# Patient Record
Sex: Female | Born: 1935
Health system: Southern US, Community
[De-identification: ages and names within clinical notes are randomized; demographics above are authoritative.]

## PROBLEM LIST (undated history)

## (undated) DIAGNOSIS — C801 Malignant (primary) neoplasm, unspecified: Secondary | ICD-10-CM

## (undated) DIAGNOSIS — C50919 Malignant neoplasm of unspecified site of unspecified female breast: Secondary | ICD-10-CM

## (undated) DIAGNOSIS — E119 Type 2 diabetes mellitus without complications: Secondary | ICD-10-CM

## (undated) DIAGNOSIS — M109 Gout, unspecified: Secondary | ICD-10-CM

## (undated) DIAGNOSIS — M1990 Unspecified osteoarthritis, unspecified site: Secondary | ICD-10-CM

## (undated) DIAGNOSIS — E7800 Pure hypercholesterolemia, unspecified: Secondary | ICD-10-CM

## (undated) DIAGNOSIS — F0390 Unspecified dementia without behavioral disturbance: Secondary | ICD-10-CM

## (undated) DIAGNOSIS — G709 Myoneural disorder, unspecified: Secondary | ICD-10-CM

## (undated) DIAGNOSIS — I10 Essential (primary) hypertension: Secondary | ICD-10-CM

## (undated) DIAGNOSIS — I2510 Atherosclerotic heart disease of native coronary artery without angina pectoris: Secondary | ICD-10-CM

## (undated) DIAGNOSIS — I219 Acute myocardial infarction, unspecified: Secondary | ICD-10-CM

## (undated) DIAGNOSIS — E039 Hypothyroidism, unspecified: Secondary | ICD-10-CM

## (undated) HISTORY — PX: CARDIAC CATHETERIZATION: SHX172

## (undated) HISTORY — PX: OTHER SURGICAL HISTORY: SHX169

## (undated) HISTORY — PX: ABDOMINAL HYSTERECTOMY: SHX81

## (undated) HISTORY — PX: BACK SURGERY: SHX140

## (undated) HISTORY — PX: CORONARY ARTERY BYPASS GRAFT: SHX141

## (undated) HISTORY — DX: Pure hypercholesterolemia, unspecified: E78.00

## (undated) HISTORY — PX: HIP SURGERY: SHX245

## (undated) HISTORY — PX: MASTECTOMY: SHX3

## (undated) HISTORY — PX: CARDIAC SURGERY: SHX584

## (undated) HISTORY — PX: JOINT REPLACEMENT: SHX530

## (undated) HISTORY — PX: APPENDECTOMY: SHX54

## (undated) HISTORY — PX: EYE SURGERY: SHX253

---

## 1998-06-09 ENCOUNTER — Ambulatory Visit (HOSPITAL_COMMUNITY): Admission: RE | Admit: 1998-06-09 | Discharge: 1998-06-09 | Payer: Self-pay | Admitting: *Deleted

## 1998-07-18 ENCOUNTER — Inpatient Hospital Stay (HOSPITAL_COMMUNITY): Admission: RE | Admit: 1998-07-18 | Discharge: 1998-07-21 | Payer: Self-pay | Admitting: *Deleted

## 2000-06-09 ENCOUNTER — Ambulatory Visit (HOSPITAL_COMMUNITY): Admission: RE | Admit: 2000-06-09 | Discharge: 2000-06-09 | Payer: Self-pay | Admitting: Cardiology

## 2000-06-24 ENCOUNTER — Encounter: Payer: Self-pay | Admitting: Thoracic Surgery (Cardiothoracic Vascular Surgery)

## 2000-06-25 ENCOUNTER — Encounter: Payer: Self-pay | Admitting: Thoracic Surgery (Cardiothoracic Vascular Surgery)

## 2000-06-25 ENCOUNTER — Inpatient Hospital Stay (HOSPITAL_COMMUNITY)
Admission: RE | Admit: 2000-06-25 | Discharge: 2000-06-30 | Payer: Self-pay | Admitting: Thoracic Surgery (Cardiothoracic Vascular Surgery)

## 2000-06-26 ENCOUNTER — Encounter: Payer: Self-pay | Admitting: Thoracic Surgery (Cardiothoracic Vascular Surgery)

## 2000-06-27 ENCOUNTER — Encounter: Payer: Self-pay | Admitting: Thoracic Surgery (Cardiothoracic Vascular Surgery)

## 2001-09-29 ENCOUNTER — Ambulatory Visit (HOSPITAL_COMMUNITY): Admission: RE | Admit: 2001-09-29 | Discharge: 2001-09-29 | Payer: Self-pay | Admitting: *Deleted

## 2001-09-29 ENCOUNTER — Encounter: Payer: Self-pay | Admitting: *Deleted

## 2001-09-29 ENCOUNTER — Encounter (INDEPENDENT_AMBULATORY_CARE_PROVIDER_SITE_OTHER): Payer: Self-pay | Admitting: Specialist

## 2002-12-07 ENCOUNTER — Encounter: Payer: Self-pay | Admitting: Specialist

## 2002-12-13 ENCOUNTER — Encounter: Payer: Self-pay | Admitting: Specialist

## 2002-12-13 ENCOUNTER — Inpatient Hospital Stay (HOSPITAL_COMMUNITY): Admission: RE | Admit: 2002-12-13 | Discharge: 2002-12-17 | Payer: Self-pay | Admitting: Specialist

## 2003-02-02 ENCOUNTER — Encounter: Payer: Self-pay | Admitting: *Deleted

## 2003-02-02 ENCOUNTER — Ambulatory Visit (HOSPITAL_COMMUNITY): Admission: RE | Admit: 2003-02-02 | Discharge: 2003-02-02 | Payer: Self-pay | Admitting: *Deleted

## 2006-05-29 ENCOUNTER — Encounter: Admission: RE | Admit: 2006-05-29 | Discharge: 2006-05-29 | Payer: Self-pay

## 2007-01-15 ENCOUNTER — Encounter: Admission: RE | Admit: 2007-01-15 | Discharge: 2007-01-15 | Payer: Self-pay | Admitting: Internal Medicine

## 2007-02-23 ENCOUNTER — Encounter: Admission: RE | Admit: 2007-02-23 | Discharge: 2007-02-23 | Payer: Self-pay | Admitting: Orthopedic Surgery

## 2007-06-03 ENCOUNTER — Ambulatory Visit: Admission: RE | Admit: 2007-06-03 | Discharge: 2007-06-03 | Payer: Self-pay | Admitting: Neurosurgery

## 2007-06-16 ENCOUNTER — Inpatient Hospital Stay (HOSPITAL_COMMUNITY): Admission: RE | Admit: 2007-06-16 | Discharge: 2007-06-18 | Payer: Self-pay | Admitting: Neurosurgery

## 2007-09-30 ENCOUNTER — Ambulatory Visit (HOSPITAL_BASED_OUTPATIENT_CLINIC_OR_DEPARTMENT_OTHER): Admission: RE | Admit: 2007-09-30 | Discharge: 2007-09-30 | Payer: Self-pay | Admitting: Orthopedic Surgery

## 2008-01-08 ENCOUNTER — Inpatient Hospital Stay (HOSPITAL_COMMUNITY): Admission: RE | Admit: 2008-01-08 | Discharge: 2008-01-12 | Payer: Self-pay | Admitting: Orthopedic Surgery

## 2011-01-15 NOTE — Op Note (Signed)
Sandy Owens, Sandy Owens             ACCOUNT NO.:  [id number]   MEDICAL RECORD NO.:  [id number]          PATIENT TYPE:  AMB   LOCATION:  DSC                          FACILITY:  [facility]   PHYSICIAN:  Alta Corning, M.D.   DATE OF BIRTH:  Oct 16, 1923   DATE OF PROCEDURE:  09/30/2007  DATE OF DISCHARGE:                               OPERATIVE REPORT   PREOPERATIVE DIAGNOSIS:  Medial meniscal tear with suspected lateral  degenerative changes.   POSTOPERATIVE DIAGNOSIS:  1. Posterior horn medial meniscal tear.  2. Anterior mid body lateral meniscal tear and chondromalacia patella.   PROCEDURE:  1. Partial posterior horn medial meniscectomy with corresponding      debridement of the medial femoral compartment.  2. Partial lateral meniscectomy anterior horn of mid body with      corresponding debridement in the lateral compartment.  3. Debridement of chondromalacia patella.   SURGEON:  Alta Corning, M.D.   ASSISTANT:  Gary Fleet, P.A.   ANESTHESIA:  General.   INDICATIONS FOR PROCEDURE:  The patient is an 77 year old female with a  long history of having had significant degenerative changes in the  lateral side with new onset medial symptoms.  We treated her  conservatively for a period of time.  MRI was obtained which showed a  questionable medial meniscal tear as well as lateral degenerative  changes.  We talked about treatment options, but ultimately we felt that  arthroscopic intervention would be appropriate given that her symptoms  were more mechanical and intermittent.  She was brought to the operating  room for this procedure.   DESCRIPTION OF PROCEDURE:  The patient was brought to the operating room  and after adequate anesthesia was obtained with general endotracheal,  the patient was placed on the operating table.  The left leg was prepped  and draped in the usual sterile fashion.  Following this routine  arthroscopic examination of the left leg revealed that there  was an  obvious mid body posterior horn medial meniscal tear with chondromalacia  of the medial femoral condyle grade 2.  This was debrided to a smooth  and stable rim posteriorly and the medial femoral condyle was debrided.  Attention was turned to the ACL which was normal.  Attention was turned  laterally where there were some grade 4 changes in the lateral femoral  condyle and lateral tibial plateau.  This was over a fairly extensive  area in the flexion space laterally.  The lateral meniscus was torn  anteriorly and mid body.  This was debrided with a suction shaver back  to a smooth and stable rim.  Once this was completed, a probe was used  to probe the lateral meniscus which was stable posteriorly.  Following  this the flaking pieces of cartilage laterally were debrided as well.  Following this attention was turned to the patellofemoral joint which  had midline patellar tracking but did show some chondromalacia of the  grade 2 variety.  This was debrided back to a smooth and stable rim.  Once this was  completed, the knee was copiously and thoroughly irrigated  and suctioned  dry.  The arthroscopic portals were closed with a bandage.  A sterile  compressive dressing was applied.  The patient was taken to the recovery  room where she was noted to be in satisfactory condition.  Estimated  blood loss was none.      Alta Corning, M.D.  Electronically Signed     JLG/MEDQ  D:  09/30/2007  T:  09/30/2007  Job:  [id number]

## 2011-01-15 NOTE — Op Note (Signed)
Sandy Owens, Sandy Owens             ACCOUNT NO.:  [id number]   MEDICAL RECORD NO.:  [id number]          PATIENT TYPE:  INP   LOCATION:  5003                         FACILITY:  [facility]   PHYSICIAN:  Alta Corning, M.D.   DATE OF BIRTH:  02/04/1924   DATE OF PROCEDURE:  01/08/2008  DATE OF DISCHARGE:                               OPERATIVE REPORT   PREOPERATIVE DIAGNOSIS:  End-stage degenerative joint disease, left  knee.   POSTOPERATIVE DIAGNOSIS:  End-stage degenerative joint disease, left  knee.   OPERATIONS AND PROCEDURES:  1. Left total knee replacement with a sigma system, size 2 femurs,      size 2 tibia, 10-mm bridging bearing, and 32-mm all-polyethylene      patella.  2. Computer assisted left total knee replacement.   SURGEON:  Alta Corning, MD   ASSISTANT:  Gary Fleet, PA   ANESTHESIA:  General.   BRIEF HISTORY:  Sandy Owens is an 77 year old female with a long  history of having had significant left knee pain, we treated  conservatively for a period of time.  She had arthroscopy, showed she  had some grade IV chondromalacia on the lateral compartment.  She had  lot of trouble getting out of a chair, a lot of pain, and flexion, and  ultimately we talked about taking to the operating room.  Because of her  valgus alignment, we felt that computer assistance would be helpful in  aligning the knee, and this was chosen to be used in preoperatively.  She was brought to the operating for left total knee replacement.   PROCEDURE:  The patient was brought to the operating room and after  adequate anesthesia was obtained with general anesthetic, the patient  was placed supine on the operating table.  The left leg was prepped and  draped in usual sterile fashion.  Following this, the leg was  exsanguinated of blood, pressure tourniquet was inflated to 300 mmHg.  Following this, a midline incision was made.  Subcutaneous tissues taken  at down the level of the extensor  mechanism.  Medial parapatellar  arthrotomy was undertaken, and at this point anterior and posterior  cruciates were excised, and medial and lateral meniscus were excised.  The leg was exposed, and at this point, the computer assistance modules  were placed, 2 pins at the tibia and 2 pins at the femur and then the  registration process was taken at about 20 minutes of the case.  At this  point, the attention was turned back to the tibia, which cut  perpendicular to the following axis under computer assistance, this  returned at the femur which cut perpendicular to the anatomic axis under  computer assistance.  Once this was done, the spacer blocks were put in  place and knee then came into full extension quite nicely.  At this  point in time, the attention was turned towards the sizing the femur and  sized to a perfect size 2, anterior and posterior cuts were made as well  as the chamfers and the box cut.  The tibia was then exposed and sized  to a size 2 and was packed and placed and then centrally drilled, and  then the box was used on this.  Once this was completed, the 10-mm  bearing was put in place.  The patella was cut with a jig down to the  level of 13 mm and a 32 was chosen as a patellar trial.  The knee was  then put through a range of motion with excellent stability, excellent  perfect neutral alignment along distance module, perfect gap balancing,  and flexion and extension.  There was a 0.5 mm difference between the  gaps.  At this point, all trial components were removed.  The knee was  copiously and thoroughly lavaged with pulsatile lavage irrigation.  The  following components were cemented and placed.  Once this was completed,  the cement was allowed to hardened with a trial 10 spacer in and the  tourniquet was let down, the spacer was removed and all bleeding was  controlled at this point with easy access into the back of the knee.  Once this was completed, the medium  Hemovac drain was placed and the  medial parapatellar arthrotomy was closed with #1 Vicryl running, skin  with #0 and 2-0 Vicryl, and skin with staples.  A sterile compression  dressing was applied as well as knee immobilizer.  The patient was then  taken to the recovery room and was noted to be in satisfactory  condition.  Estimated blood loss for the procedure was less than 50 mL.      Alta Corning, M.D.  Electronically Signed     JLG/MEDQ  D:  01/08/2008  T:  01/09/2008  Job:  [id number]   cc:   Domenick Gong

## 2011-01-15 NOTE — Op Note (Signed)
Sandy Owens, Sandy Owens             ACCOUNT NO.:  [id number]   MEDICAL RECORD NO.:  [id number]          PATIENT TYPE:  INP   LOCATION:  3305                         FACILITY:  [facility]   PHYSICIAN:  Elizabeth Sauer, M.D.      DATE OF BIRTH:  01/11/1924   DATE OF PROCEDURE:  06/16/2007  DATE OF DISCHARGE:                               OPERATIVE REPORT   PREOPERATIVE DIAGNOSIS:  Spondylosis L3-4, L4-5.   POSTOPERATIVE DIAGNOSIS:  Spondylosis L3-4, L4-5.   OPERATIVE PROCEDURE:  L3-4, L4-5 laminotomy, foraminotomy done  bilaterally.   ANESTHESIA:  General endotracheal.   PREP:  Sterile prep and scrub with alcohol wipe.   COMPLICATIONS:  None.   ASSISTANT:  Luiz Ochoa.   BODY OF TEXT:  An 77 year old with severe lumbar spondylosis and  neurogenic claudication taken to the operating room, smoothly  anesthetized and intubated, placed prone on the operating table.  Following shave, prep, draped in the usual sterile fashion, skin was  infiltrated with 1% lidocaine with 1:400,000 epinephrine.  Skin was  incised from mid L5 to the top of L3 and the lamina of L3, L4 and L5  were exposed bilaterally in the subperiosteal plane.  Intraoperative x-  ray confirmed correctness of level.  Having confirmed correctness of  level, the high-speed drill was used to create a bilateral laminotomy  and foraminotomy.  Each one was carried up to the top of ligamentum  flavum; that was removed in a retrograde fashion.  The respective nerve  roots were identified, and foraminotomy was carried out over top of  them.  The compressive pathology was evenly distributed between right  and left.  However, L3-4 appeared to be slightly worse than L4-5.  Following complete decompression and palpation of all of the foramina  with a hook, wound was irrigated.  Hemostasis assured.  Depo-Medrol  soaked fat was placed in laminotomy defects.  Successive layers of 0  Vicryl, 2-0 Vicryl, 3-0 nylon were used to close.  Betadine Telfa  dressing was applied and made occlusive with an OpSite, and the patient  returned to the recovery room in good condition.           ______________________________  Elizabeth Sauer, M.D.     MWR/MEDQ  D:  06/16/2007  T:  06/17/2007  Job:  [id number]

## 2011-01-15 NOTE — H&P (Signed)
Sandy Owens, Sandy Owens             ACCOUNT NO.:  [id number]   MEDICAL RECORD NO.:  [id number]          PATIENT TYPE:  INP   LOCATION:  3305                         FACILITY:  [facility]   PHYSICIAN:  Elizabeth Sauer, M.D.      DATE OF BIRTH:  1924-02-19   DATE OF ADMISSION:  06/16/2007  DATE OF DISCHARGE:                              HISTORY & PHYSICAL   ADMITTING DIAGNOSES:  Spondylosis and stenosis L3-4 and L4-5.   HISTORY:  This is a very nice 77 year old right-handed white female who  since February has been having increasing pain in her back and down her  legs.  She has had an epidural to help the pain but her legs still feel  worse.  It worsens when she gets up and around on them.  She has not  noticed any difficulties with her bladder.  MR shows stenosis at L3-4  and L4-5 and she is now admitted for a laminotomy, foraminotomy done  bilaterally.   MEDICAL HISTORY:  Remarkable for coronary artery disease.  She had a  bypass in 2001.  She has cardiac clearance from Dr. Glade Lloyd.   SURGICAL HISTORY:  Hysterectomy and hip replacement.   MEDICATIONS:  Calcium, vitamins, aspirin, Levothroid, atenolol,  hydrochlorothiazide, Januvia and Crestor.   ALLERGIES:  She has no allergies.   SOCIAL HISTORY:  She does not smoke.  Drinks intermittently.  She is  retired.   FAMILY HISTORY:  Parents are deceased.  There is a history of diabetes  and hypertension.   REVIEW OF SYSTEMS:  Remarkable for wearing glasses, cataracts,  hypertension, hypercholesterolemia, leg weakness and arthritis.   PHYSICAL EXAMINATION:  HEENT:  Exam is normal.  NECK:  Reasonable range of motion of her neck.  CHEST:  Clear.  CARDIAC:  Regular rate and rhythm.  ABDOMEN:  Nontender.  No hepatosplenomegaly.  EXTREMITIES:  Without clubbing or cyanosis.  Peripheral pulses are good.  GU:  Exam is deferred.  NEUROLOGIC:  She is awake, alert and oriented.  Cranial nerves are  normal.  There is 5/5 strength throughout the  upper and lower  extremities.  She has mild stocking neuropathy.  Reflexes are 1 at the  knees, 1 at the left ankle, absent at the right.   MR shows spinal stenosis at L3-4 and L4-5.  This is central as well as  in the lateral recesses.   CLINICAL IMPRESSION:  Neurogenic claudication.  She has her cardiac  clearance obtained for L3-4, L4-5 laminotomy and foraminotomy done  bilaterally.  Risks and benefits of this approach have been discussed  with her and she wishes to proceed.           ______________________________  Elizabeth Sauer, M.D.     MWR/MEDQ  D:  06/16/2007  T:  06/17/2007  Job:  [id number]

## 2011-01-18 NOTE — Discharge Summary (Signed)
NAMEAIRIN, HASZ                       ACCOUNT NO.:  [id number]   MEDICAL RECORD NO.:  [id number]                   PATIENT TYPE:  INP   LOCATION:  [id number]                                 FACILITY:  [facility]   PHYSICIAN:  Laurence Slate, M.D.                DATE OF BIRTH:  November 29, 1923   DATE OF ADMISSION:  12/13/2002  DATE OF DISCHARGE:  12/17/2002                                 DISCHARGE SUMMARY   ADMITTING DIAGNOSES:  1. Severe osteoarthritis right hip.  2. Hypertension.  3. Gastroesophageal reflux disease.  4. Hypothyroid.  5. Hypercholesterolemia.   DISCHARGE DIAGNOSES:  1. Severe osteoarthritis right hip.  2. Hypertension.  3. Gastroesophageal reflux disease.  4. Hypothyroid.  5. Hypercholesterolemia.  6. Mild postoperative anemia.   Gleason:  On 12/13/2002 the patient underwent right total  hip replacement arthroplasty utilizing a cemented [employer] femoral  component.  She had Omnifit PSL.   BRIEF HISTORY:  The patient is a 77 year old white female with progressive  problems concerning her right hip with deterioration documented by x-ray as  well as examination.  She had a bone-on-bone deformity on x-ray and  extremely painful range of motion of the right hip, difficulty walking, had  to use a walking-assistance  device.  After much discussion and  consideration it was decided she would benefit from the above procedure and  is admitted for same.   COURSE IN THE HOSPITAL:  The patient tolerated the surgical procedure quite  well and entered physical therapy with eagerness.  She did very well  ambulating, weightbearing as tolerated in right lower extremity.  We want  her to keep the TED hose on at all times on the operative extremity and this  was reinforced with nursing staff.  Abduction pillow was used for sleeping.  As she did well with physical therapy it was decided that she would benefit  with a home physical therapy program.  This was  arranged through [place]  with durable medical goods supplied by [employer].   On the day of discharge wound was dry, hemoglobin was stable at 11.0.  She  is to be discharged home, continue with incentive spirometer, wear TED hose  on the right lower extremity.   LABORATORY VALUES:  Laboratory values in the hospital hematologically showed  a CBC with differential preoperatively completely within normal limits,  hemoglobin of 13.8, hematocrit was 4.2.  Final hemoglobin was 11.0 with  hematocrit of 31.7.  Blood chemistries were within normal limits, had a very  slightly elevated glucose.  Urinalysis showed minimal urinary tract  infection preoperatively however, when repeated postoperatively, 12/13/2002,  it was completely negative.  Chest x-ray showed no active disease and  postoperative hip films showed right total hip replacement without definite  complicated features.  That was read by Dr. Margarette Canada.  Electrocardiogram  showed normal bradycardia sinus rhythm.   CONDITION ON DISCHARGE:  Improved/stable.  PLAN:  The patient discharged home.  She is to continue with weightbearing  as tolerated right lower extremity, wear the TED hose to the operative  extremity, encourage ankle pumps and continue with incentive spirometer.  She is to continue with home medications and diet.  Return to see Dr. Eulas Post  approximately 10-14 days.   DISCHARGE MEDICATIONS:  1. Percocet 5/325 (#50) one to two q.4-6h. p.r.n.  2. Robaxin 500 mg (#30) with two refills one q.6h. p.r.n. muscle spasm.  3. Trinsicon (#60) one b.i.d.  4. Continue with Coumadin protocol 4 weeks after date of surgery.     Dooley L. Vanita Ingles.                 Laurence Slate, M.D.    DLU/MEDQ  D:  12/17/2002  T:  12/17/2002  Job:  [id number]   cc:   Merrilee Seashore, M.D.  [facility]  [place] [address]  Fax: [phone]

## 2011-01-18 NOTE — Procedures (Signed)
[place]  Patient:    Sandy Owens, Sandy Owens Visit Number: [id number] MRN: [id number]          Service Type: END Location: ENDO Attending Physician:  Jim Desanctis Dictated by:   Jim Desanctis, M.D. Proc. Date: 09/29/01 Admit Date:  09/29/2001                             Procedure Report  PROCEDURE PERFORMED:  Colonoscopy.  ENDOSCOPIST:  Jim Desanctis, M.D.  INDICATIONS FOR PROCEDURE:  Colon cancer screening.  ANESTHESIA:  Demerol 50 mg, Versed ____________  DESCRIPTION OF PROCEDURE:  With the patient mildly sedated in the left lateral decubitus position, the Olympus videoscopic variable stiffness colonoscope was inserted in the rectum and passed under direct vision to what appeared to be proximal to the splenic flexure by landmark but I could not pass it past this flexure or turn, rather, so I withdrew the colonoscope, taking circumferential views of the entire colonic mucosa visualized until it reached the rectum, which appeared normal.  The endoscope was withdrawn.  Patients vital signs and pulse oximeter remained stable.  The patient tolerated the procedure well and without apparent complications.  FINDINGS:  Negative colonoscopic examination proximal to the splenic flexure.  PLAN:  Will get barium enema to review the remainder of this area that was not visualized and have patient follow up with me as an outpatient. Dictated by:   Jim Desanctis, M.D. Attending Physician:  Jim Desanctis DD:  09/29/01 TD:  09/29/01 Job: [id number]

## 2011-01-18 NOTE — Cardiovascular Report (Signed)
. [facility]  Patient:    Sandy Owens, Sandy Owens                    MRN: [id number] Proc. Date: 06/09/00 Adm. Date:  [id number] Attending:  Lisbeth Renshaw CC:         Genice Rouge, M.D.  John R. Glade Lloyd, M.D.  Cardiac Catheterization Laboratory   Cardiac Catheterization  REFERRING PHYSICIAN:  Genice Rouge, M.D.  PROCEDURES PERFORMED: 1.  Left heart catheterization. 2.  Coronary cineangiography. 3.  Left internal mammary artery cineangiography. 4.  Left ventricular cineangiography. 5.  Abdominal aortogram. 6.  Perclose of the right femoral artery.  INDICATIONS:  This 77 year old female presented with chest pain radiating into both arms and associated with marked weakness.  She also has a long history of hypertension and hyperlipidemia.  She had a stress Cardiolite test done at [facility], which showed marked changes of reversible ischemia.  DESCRIPTION OF PROCEDURE:  After signing an informed consent, the patient was premedicated with 50 mg of Benadryl intravenously and brought to the cardiac catheterization laboratory at [facility].  Her right groin was prepped and draped in a sterile fashion and anesthetized locally with 1% Lidocaine.  A 6-French introducer sheath was inserted percutaneously into the right femoral artery.  Six [place] #4 Judkins coronary catheters were used to make injections into the coronary arteries.  The right coronary catheter was used to make a midstream injection into the left subclavian artery, visualizing the left internal mammary artery.  A 6-French pigtail catheter was used to measure pressures in the left ventricle and aorta and to make midstream injections into the left ventricle and abdominal aorta.  The patient tolerated the procedure well and no complications were noted.  At the end of the procedure, the catheter and sheath were removed from the right femoral artery and  hemostasis was easily obtained with a Perclose closure system.  MEDICATIONS GIVEN:  None.  HEMODYNAMIC DATA:  Left ventricular pressure 143/10-24.  Aortic pressure 143/59, with a mean of 88.  Left ventricular ejection fraction 60%.  CINE FINDINGS:  CORONARY CINEANGIOGRAPHY: 1.  Left coronary artery:  The ostium and left main appear normal.  2.  Left anterior descending:  The LAD has a critical lesion in its middle     segment, which involves the takeoff of the first and second diagonal     branches.  This stenosis appears to be 95% or greater and also causes     a 90% stenosis of the first diagonal branch.  There is a mild lesion     in the mid to distal segment.  3.  Circumflex coronary artery:  The circumflex has a 50% to 60% focal     concentric stenosis in the middle segment just after the takeoff of the     first obtuse marginal branch.  The remainder of the circumflex system     appears normal.  4.  Right coronary artery:  The right coronary artery is totally occluded in     its proximal segment.  There is a good local collateral which causes good     filling of the middle segment and posterolateral branches to the right     ventricle.  There is slow antegrade flow into the distal segment and this     distal segment also fills retrograde during injections into the left     coronary artery.  5.  Left internal mammary artery:  This appears normal.  LEFT VENTRICULAR CINEANGIOGRAM:  The left ventricular chamber size and contractility appears normal.  There is mild apical hypokinesia.  The remaining segments appear normal.  The mitral and aortic valves appear normal.  ABDOMINAL AORTOGRAM:  The abdominal aorta has diffuse irregular plaque, which is moderately calcified and there is no significant stenotic or dilated area. There is good antegrade flow.  FINAL DIAGNOSES: 1.  Severe two-vessel coronary artery disease with critical lesions in the     proximal right coronary  artery and mid left anterior descending artery. 2.  Moderate stenosis mid circumflex. 3.  Normal left internal mammary artery. 4.  Normal left ventricular function at rest. 5.  Calcific irregular plaque in the abdominal aorta. 6.  Normal renal arteries. 7.  Successful Perclose of the right femoral artery.  DISPOSITION:  We will ask CVTS to see for coronary artery bypass graft surgery.  I feel that she is a good candidate considering the totally occluded right coronary artery and critical stenosis of her mid LAD which involves two diagonal branches. DD:  06/09/00 TD:  06/09/00 Job: [id number]

## 2011-01-18 NOTE — Discharge Summary (Signed)
NAMESHERRILL, MCNALLEY             ACCOUNT NO.:  [id number]   MEDICAL RECORD NO.:  [id number]          PATIENT TYPE:  INP   LOCATION:  5003                         FACILITY:  [facility]   PHYSICIAN:  Alta Corning, M.D.   DATE OF BIRTH:  06/23/1924   DATE OF ADMISSION:  01/08/2008  DATE OF DISCHARGE:  01/12/2008                               DISCHARGE SUMMARY   ADMITTING DIAGNOSES:  1. End-stage degenerative joint disease, left knee.  2. Coronary artery disease.  3. Type 2 diabetes.  4. Hypertension.   DISCHARGE DIAGNOSES:  1. End-stage degenerative joint disease, left knee.  2. Coronary artery disease.  3. Type 2 diabetes.  4. Hypertension.   PROCEDURE IN HOSPITAL:  Left total knee arthroplasty, computer-assisted,  by Dorna Leitz, MD, on Jan 08, 2008.   BRIEF HISTORY:  Sandy Owens is an 77 year old female with long  history of left knee pain.  Standing x-rays of the left knee showed bone-  on-bone DJD.  She had minimal  pain upon ambulation but now relieved  with exhaustive conservative treatment.  Based upon her clinical  radiographic findings, she was felt to be a candidate for a left total  knee replacement and was admitted for this.   PRIOR LABORATORY STUDIES:  Her EKG on admission showed sinus bradycardia  with right bundle branch block with no significant changes since her  previous EKG on June 03, 2007.  Hemoglobin on admission was 14.8,  hematocrit 44.1, indices within normal limits.  On postop day #1, her  hemoglobin was 12.2.  On postop day #2, it was 11.2; postop day #3, it  was 11.8.  Protime on admission was 12.6 seconds and INR was 0.9, PTT of  26.  On the day of discharge, her prothrombin time was 16.8 seconds with  an INR of 1.3.  CMET was within normal limits on admission.  Urinalysis  showed no abnormalities.   HOSPITAL COURSE:  The patient underwent a left total knee arthroplasty,  is well described by Dr. Berenice Primas' operative note on Jan 08, 2008.  Preoperatively, she was given a 1 g of Ancef and 80 mg of gentamicin per  protocol, and postoperatively she was given 1 g of Ancef q.8 h. x3  doses.  Coumadin was started for DVT prophylaxis.  Physical therapy  ordered for walk and ambulation, weightbearing as tolerated on the left.  CPM machine was needed for knee range of motion.  PCA morphine pump was  used for pain control and other IV fluids.  On postop day #1, her  hemoglobin was stable at 12.2, potassium was 3.6.  Her dressing was  changed due to some drainage __________intact distally.  Her Foley  catheter discontinued and physical therapy was continued.  On postop day  #2, her vital signs were stable.  Her INR was 1.1.  Her hemoglobin was  11.2.  Her left knee was within normal limits.  No sign of infection  noted and __________ distally.  CPM was at 0-6 degrees.  Her IV and PCA  morphine pump were discontinued.  Her physical therapy was continued,  weightbearing as  tolerated.  She was doing well and her Foley catheter  that was placed at time of surgery was removed.  On postop day #3, she  complained of some knee pain.  She was taking fluids without  difficulties and progressed with physical therapy.  Her hemoglobin was  11.8 and INR was 1.3.  She continued with physical therapy x1 more day  and on Jan 12, 2008, she was without complaints.  She was doing well.  Her knee wound was benign.  Her calf was soft and  make good progress  with physical therapy.  Vital signs were stable.  She was afebrile.  Her  INR was 1.3.  Her CBGs were running in the 133s to 200 range.  She was  discharged home in improved condition and was on a diabetic diet.  She  was given Rx Percocet 5 mg for pain, Robaxin 750 mg p.r.n. spasm and  Coumadin x1 month postop for DVT prophylaxis.  She needs Home Health,  physical therapy, and home and CPM machine and Home Health Coumadin  management.  She will follow up with Dr. Berenice Primas in 10 days and with Dr.   Ashby Dawes in his office in 2 weeks.      Gary Fleet, P.A.      Alta Corning, M.D.  Electronically Signed    JB/MEDQ  D:  02/24/2008  T:  02/25/2008  Job:  [id number]   cc:   Merrilee Seashore, M.D.

## 2011-01-18 NOTE — H&P (Signed)
Sandy Owens, Sandy Owens                       ACCOUNT NO.:  [id number]   MEDICAL RECORD NO.:  [id number]                   PATIENT TYPE:  INP   LOCATION:  NA                                   FACILITY:  [facility]   PHYSICIAN:  Laurence Slate, M.D.                DATE OF BIRTH:  11/23/1923   DATE OF ADMISSION:  12/13/2002  DATE OF DISCHARGE:                                HISTORY & PHYSICAL   CHIEF COMPLAINT:  Right hip pain.   HISTORY OF PRESENT ILLNESS:  The patient is a 77 year old female who was  referred to Dr. Eulas Post by Dr. Marveen Reeks with a history of right hip pain for  several years.  The patient states that the pain is getting progressively  worse over the last few months, and it seems only Percocet will alleviate  the pain at this time.  After much discussion, Dr. Eulas Post felt that it was  best for the patient to proceed with a right total hip replacement.  The  risks and benefits of the surgery were discussed with the patient, and the  patient wishes to proceed.   PAST MEDICAL HISTORY:  1. Hypertension.  2. Gastroesophageal reflux disease.  3. Hypothyroidism.  4. Hypercholesterolemia.   PAST SURGICAL HISTORY:  1. Appendectomy.  2. Hysterectomy.  3. Coronary artery bypass graft.   MEDICATIONS:  1. Synthroid 150 mcg one p.o. daily.  2. Hydrochlorothiazide 25 mg one p.o. daily.  3. Atenolol 25 mg one p.o. b.i.d.  4. Gemfibrozil 600 mg one p.o. b.i.d.  5. Timoprazole 20 mg one p.o. daily.  6. Amitriptyline 50 mg one p.o. q.h.s.  7. Aspirin 325 mg one p.o. daily.   ALLERGIES:  No known drug allergies.   SOCIAL HISTORY:  The patient denies any tobacco or alcohol use.  She lives  in a two story house with 15 stairs in the house, and her daughter will be  her caregiver after surgery.   FAMILY HISTORY:  Mother deceased of diabetes and hypertension.  Father  deceased with leukemia and hypertension.  Brother with stroke.   REVIEW OF SYMPTOMS:  GENERAL:  Denies fevers,  chills, night sweats, bleeding  tendencies.  CNS:  Denies blurry or double vision, seizures, headaches, or  paralysis.  RESPIRATORY:  Denies shortness of breath, productive cough, or  hemoptysis.  CARDIOVASCULAR:  Denies chest pain, angina, or orthopnea.  GASTROINTESTINAL:  Positive constipation.  Denies nausea, vomiting,  diarrhea, melena, or bloody stools.  GENITOURINARY:  Denies dysuria,  hematuria, or discharge.  MUSCULOSKELETAL:  Positive tingling and numbness  in bilateral feet.   PHYSICAL EXAMINATION:  VITAL SIGNS:  Blood pressure 128/60, pulse 68,  respirations 12.  GENERAL:  A well-developed, well-nourished 77 year old  female.  HEENT:  Normocephalic, atraumatic.  Pupils equal, round, reactive  to light.  NECK:  Supple, no carotid bruits.  CHEST:  Clear to auscultation  and percussion, no wheezes or crackles.  HEART:  Regular rate and rhythm  without murmurs, rubs, or gallops.  ABDOMEN:  Soft, nontender, nondistended,  positive bowel sounds x4.  EXTREMITIES:  Decreased range of motion to the  right hip.  The patient has good distal pulses and is neurovascularly intact  distally.  SKIN:  No rashes or lesions.   LABORATORY DATA:  X-ray reveals severe degenerative osteoarthritis of the  right hip.   IMPRESSION:  1. Osteoarthritis of the right hip.  2. Hypertension.  3. Gastroesophageal reflux disease.  4. Hypothyroid.  5. Hypercholesterolemia.   PLAN:  The patient will be admitted to [facility] to undergo right  total hip arthroplasty by Dr. Laurence Slate on 12/13/02.     Pedro Earls, P.A.-C.                   Laurence Slate, M.D.    SW/MEDQ  D:  12/07/2002  T:  12/07/2002  Job:  [id number]   cc:   Minette Brine. Glade Lloyd, M.D.  [place]  [place] [address]  Fax: [phone]   Anson Oregon, M.D.  [address] [place]  [place] [address]  Fax: [phone]

## 2011-01-18 NOTE — Procedures (Signed)
[place]. [facility]  Patient:    Sandy Owens, Sandy Owens Visit Number: [id number] MRN: [id number]          Service Type: END Location: ENDO Attending Physician:  Jim Desanctis Dictated by:   Jim Desanctis, M.D. Admit Date:  09/29/2001                             Procedure Report  PROCEDURE:  Upper endoscopy.  INDICATION:  GERD.  ANESTHESIA:  Demerol 50 mg, Versed 5 mg.  DESCRIPTION OF PROCEDURE:  With the patient mildly sedated and in the left lateral decubitus position, the Olympus videoscopic endoscope was inserted in the mouth, passed under direct vision through the esophagus, which appeared normal.  There was no evidence of Barretts.  We entered into the stomach. Fundus, body, antrum, duodenal bulb, second portion of the duodenum were viewed.  From this point the endoscope was slowly withdrawn, taking circumferential views of the entire duodenal mucosa until the endoscope pulled back into the stomach, placed in retroflexion to view the stomach from below. The endoscope was then straightened and withdrawn, taking circumferential views of the remaining gastric and esophageal mucosa, stopping only in the antrum, where ulceration was seen, photographed, and biopsied.  The patients vital signs and pulse oximetry remained stable.  The patient tolerated the procedure well and without apparent complications.  FINDINGS:  Ulcer of antrum.  Await biopsy report.  The patient will call me for results and follow up with me as an outpatient.  Proceed to colonoscopy. Dictated by:   Jim Desanctis, M.D. Attending Physician:  Jim Desanctis DD:  09/29/01 TD:  09/29/01 Job: [id number]

## 2011-01-18 NOTE — Op Note (Signed)
[name]. [facility]  Patient:    Sandy Owens, Sandy Owens                    MRN: [id number] Proc. Date: 06/25/00 Adm. Date:  [id number] Attending:  Darylene Price CC:         Minette Brine. Glade Lloyd, M.D.  Genice Rouge, M.D.  CV[facility] office   Operative Report  PREOPERATIVE DIAGNOSIS:  Severe three-vessel coronary artery disease with class 3 progressive angina.  POSTOPERATIVE DIAGNOSIS:  Severe three-vessel coronary artery disease with class 3 progressive angina.  OPERATION PERFORMED:  Median sternotomy for coronary artery bypass grafting x 4 (left internal mammary artery to distal left anterior descending coronary artery, saphenous vein graft to second circumflex marginal branch, saphenous vein graft to posterior descending coronary artery and sequential saphenous vein graft to right posterolateral branch).  SURGEON:  Valentina Gu. Roxy Manns, M.D.  ASSISTANT:  Earnstine Regal, P.A.  ANESTHESIA:  General.  INDICATIONS FOR PROCEDURE:  The patient is a 77 year old white female followed by Dr. Genice Rouge and referred by Dr. Roe Rutherford for management of coronary artery disease.  She presents with new onset angina and a stress Cardiolite exam which is positive for ischemic change.  The patient underwent cardiac catheterization by Dr. Glade Lloyd demonstrating severe three-vessel coronary artery disease.  The patient and the family were counseled at length regarding the indications and potential benefits of coronary artery bypass grafting.  She understand the associated risks of surgery including but not limited to the risks of death, stroke, myocardial infarction, bleeding requiring blood transfusion, arrhythmia, infection and recurrent coronary artery disease.  She accepts these risks as well as any unforseen complications and desires to proceed with the surgery as described.  DESCRIPTION OF PROCEDURE:  The patient was brought to the operating room on the  above-mentioned date and invasive hemodynamic monitoring was established by the anesthesia service under the care and direction of Dr. Tamela Gammon.  The patient was placed in the supine position on the operating table.  Following induction with general endotracheal anesthesia, the patients chest, abdomen, both groins and both lower extremities were prepared and draped in a sterile manner.  A median sternotomy incision was performed and the left internal mammary artery was dissected from the chest wall and prepared for bypass grafting. The left internal mammary artery was somewhat small caliber but otherwise good quality and has excellent forward flow.  Simultaneously, saphenous vein was obtained from the patients right thigh and the upper portion of the right lower leg through a series of longitudinal incisions.  The saphenous vein was also slightly small caliber but otherwise good quality conduit for the most part.  The patient was heparinized systemically.  The pericardium was opened.  The ascending aorta was normal inappearance.  No palpable plaques were identified.  The ascending aorta and the right atrium were cannulated for cardiopulmonary bypass.  Adequate heparinization was verified.  Cardiopulmonary bypass was begun and the surface of the heart was inspected. There was a large amount of epicardial fat.  The epicardial coronary arteries were all very small caliber.  Distal sites were selected for coronary artery bypass grafting.  Of note, the first diagonal branch and the first circumflex marginal branch were both felt to be too small for bypass grafting.  The distal right coronary artery gave off the posterior descending coronary artery which had an early takeoff of the right coronary artery and traversed across the acute margin to the distal interventricular septum.  There  was a much smaller coronary artery branch of the distal right coronary artery in the proximal portion of  the interventricular septum which was too small for grafting.  There were several posterolateral branches off the distal right coronary artery, most of which were very tiny, but there was one which was somewhat dominant.  Portions of the saphenous vein and the left internal mammary artery were trimmed to appropriate length.  The temperature probe was placed in the left ventricular septum and a styrofoam pad was placed to protect the left phrenic nerve from thermal injury.  A cardioplegia catheter was placed in the ascending aorta.  The patient was cooled to 32 degrees systemic temperature.  The aortic crossclamp was applied and cardioplegia was delivered in an antegrade fashion through the aortic root.  Iced saline slush was applied for topical hypothermia.  The initial cardiac arrest was rapid and cooling was felt to be excellent.  Repeat doses of cardioplegia were administered intermittently throughout the crossclamp portion of the operation both through the aortic root and down subsequently placed vein grafts to maintain septal temperature below 15 degrees centigrade.  The following distal coronary anastomoses were performed.  (1)  The posterior descending coronary artery was grafted with a saphenous vein graft in an end-to-side fashion using a side branch off of the vein.  This coronary measured 1.4 mm in diameter and was of fair to good quality.  (2) The posterolateral branch off the distal right coronary artery was grafted using a sequential saphenous vein graft off of the vein placed in the posterior descending coronary artery.  This coronary measured 1.3 mm in diameter and was of fair quality.  (3) The second circumflex marginal branch was grafted using the saphenous vein graft in an end-to-side fashion using running 7-0 Prolene suture.  This coronary measured 1.5 mm in diameter and was  of good quality.  (4)  The distal left anterior descending coronary artery was grafted with  the left internal mammary artery using running 8-0 Prolene suture.  This coronary measured 1.4 mm in diameter and was of good quality. Both proximal saphenous vein anastomoses were performed directly to the ascending aorta prior to removal of the aortic crossclamp.  The aortic crossclamp was removed after a total crossclamp time of 62 minutes.  The heart began to beat spontaneously without need for cardioversion.  All proximal and distal anastomoses were inspected for hemostases and appropriate graft orientation.  Epicardial pacing wires were fixed to the right ventricular outflow tract and to the right atrial appendage.  The patient was weaned from cardiopulmonary bypass without difficulty.  The patients rhythm at separation from cardiopulmonary bypass was normal sinus rhythm.  Total cardiopulmonary bypass time for the operation was 96 minutes.  The venous and arterial cannulae were removed uneventfully.  Protamine was administered to reverse the anticoagulation.  The mediastinum and the left chest were irrigated with saline solution containing vancomycin.  Meticulous surgical hemostasis was ascertained.  The mediastinum and the left chest were drained with three chest tubes placed through separate stab incisions inferiorly.  The median sternotomy was closed in routine fashion.  The deep subcutaneous tissues of the right thigh were drained with a 19 [place] Blake drain.  The lower extremity incisions were closed in multiple layers in routine fashion.  All skin incisions were closed with subcuticular skin closures.  The patient tolerated the procedure well and was transported to the surgical intensive care unit in stable condition.  There were no intraoperative complications.  All sponge, needle and instrument counts were verified correct at the completion of the operation.  No autologous blood products were administered. DD:  06/25/00 TD:  06/26/00 Job: [id number]

## 2011-01-18 NOTE — Discharge Summary (Signed)
[place]. [facility]  Patient:    Sandy Owens, Sandy Owens                    MRN: [id number] Adm. Date:  [id number] Disc. Date: 06/30/00 Attending:  Darylene Price Dictator:   Lestine Box, RNFA CC:         Genice Rouge, M.D.  John R. Glade Lloyd, M.D.   Discharge Summary  DATE OF BIRTH:  Sep 20, 1923  DATE OF SURGERY:  June 25, 2000.  ADMITTING DIAGNOSIS:  Three vessel coronary artery disease with class 3-4 anginal symptoms.  PAST MEDICAL HISTORY: 1. Hypertension. 2. Hyperlipidemia. 3. Hypothyroidism. 4. GE reflux disease. 5. Degenerative disk disease (arthritic).  ALLERGIES:  Ms. Hager is intolerant to STATIN drugs; they cause GI upset.  DISCHARGE DIAGNOSES: 1. Coronary artery disease, status post coronary artery bypass graft. 2. Postoperative anemia, transfused on June 26, 2000, resolving. 3. Postoperative volume overload, resolving.  HOSPITAL COURSE/PROCEDURES:  On June 25, 2000, Ms. Morain was electively admitted to [facility] and Dr. Roxy Manns after evaluation in Dr. Ricard Dillon office on June 23, 2000.  Ms. Wassink is a 77 year old white female with no documented previous history of coronary artery disease but risk factors including hypertension, hyperlipidemia, and remote history of tobacco use. She recently was evaluated by Dr. Leda Quail and reported symptoms of progressive exertional chest discomfort radiating to both arms.  She underwent Cardiolite stress exam on September 28, which revealed an ejection fraction of 59% with changes consistent with antral, apical, and septal ischemia.  She subsequently underwent elective cardiac catheterization by Dr. Glade Lloyd on June 09, 2000, which revealed severe 2-3 vessel coronary artery disease with preserved left ventricular function.  After evaluation in Dr. Ricard Dillon office, it was decided that coronary artery bypass grafting was the appropriate treatment procedure for this  patient.  On June 25, 2000, Ms. Hewes underwent an uncomplicated coronary artery bypass grafting x 4 with Dr. Darylene Price. Grafts placed at the time of procedure:  Left internal mammary artery to the LAD, saphenous vein was grafted to the 2nd OM, saphenous vein was grafted in a sequential fashion to the PTPL.  At the conclusion of the procedure, she was transferred in a stable condition to the SICU.  Ms. [facility] postoperative course has been uneventful.  She has made very good progress, and it is anticipated that she could be discharged tomorrow morning, October 29.  DISCHARGE MEDICATIONS: 1. Enteric-coated aspirin 325 mg q.d. 2. Tylox 1-2 p.o. q.4-6h. p.r.n. for pain. 3. Atenolol 12.5 mg q.12h. 4. Lasix 40 mg q.d. x 5 days. 5. Kay Ciel 20 mEq q.d. x 5 days. 6. Ferrous sulfate 325 mg b.i.d. 7. She was instructed to resume her home medications of Synthroid 0.1 mg q.d.,    and she can resume taking her vitamins and Tums as she was previous to    admission.  FOLLOW-UP:  She is instructed to obtain an appointment to see Dr. Glade Lloyd in two weeks.  Dr. Ricard Dillon office will be calling with an appointment in three weeks, and that day she should also have a chest x-ray performed at [facility]. DD:  06/29/00 TD:  06/29/00 Job: [id number]

## 2011-01-18 NOTE — Op Note (Signed)
NAMEJAIMELYN, PLEVA                       ACCOUNT NO.:  [id number]   MEDICAL RECORD NO.:  [id number]                   PATIENT TYPE:  INP   LOCATION:  [id number]                                 FACILITY:  [facility]   PHYSICIAN:  Laurence Slate, M.D.                DATE OF BIRTH:  1924/08/23   DATE OF PROCEDURE:  12/13/2002  DATE OF DISCHARGE:                                 OPERATIVE REPORT   PREOPERATIVE DIAGNOSIS:  Degenerative arthritis, right hip.   POSTOPERATIVE DIAGNOSIS:  Degenerative arthritis, right hip.   OPERATION PERFORMED:  Right total hip replacement arthroplasty using an  Osteonics cemented femur which is an Omnifit EON plus cemented hip, stem  size 4 with a plus 5 C tapered head.  Omnifit PSL microstructured acetabular  shell size 48 with a 10 degree cup insert with the series 2 poly. The  surgical simplex was the cement.   DESCRIPTION OF PROCEDURE:  After suitable general anesthesia, she was  positioned left lateral decubitus and the right hip was prepped and draped  routinely. An Sharion Settler approach is utilized. The external rotators are  cut and tagged, used to protect the sciatic nerve. The posterior capsule was  excised. The leg was dislocated and head is amputated. The piriformis fossa  was cleaned of tissue, a guidepin is put in and with an appropriate drill  and it is very very tiny canal and reaming to a stem size 4 cement, size 5  Press-Fit was quite tight and we elected to stop there, use the rasp and go  with the cement to 4. The acetabulum was then cleaned of tissue and a good  bit of the capsule excised. It was then reamed to accept a 48 cup, was  inserted, trial reduction carried out which revealed excellent stability.  The real cup was inserted and in inserting the real cup, it was a bit more  anteverted than the trial. We repeated the trial reduction, still excellent  stability and it gave better protection for reflection and we could  hyperextend  her and externally rotate considerably without any instability  and elected to accept the slightly increased anteversion position of the  regular cup. We put in one screw which was 25 mm screw but the Press-Fit and  the cut itself was exceptionally good and then the 10 degree insert. The  cement was then mixed. A #1 distal stem plug was put in and I could not get  the pressure all the way down the canal, just could not get it in, the canal  was too small. I thoroughly irrigated and used sponges to clean down inside  of it. Loaded the cement from distal to proximal as best we could  pressurized with the blue nozzle. Adrenaline sponge had been used prior to  this as well. Once the prosthesis was inserted and the cement was held until  set, the stability was again  checked. Stability was excellent with the plus  zero which is what we had used all along which there seemed to be an  increase in shock as we were doing this, elected then to go with a plus 5 on  the C tapered head. Routine wound closure, we put the external rotators  which consisted of one suture through it back to bone which included the  piriformis and two of the muscles beneath it. The fascia lata over the  gluteus maximus fascia with #1 PDS, 2-0 Vicryl in the subcu and a running 3-  0 Vicryl and subcuticular with Steri-Strips as well as compression dressing.  She tolerated this very well with minimal blood loss, certainly under a unit  and went to recovery in good condition with a triangle pillow.                                               Laurence Slate, M.D.    PC/MEDQ  D:  12/13/2002  T:  12/13/2002  Job:  [id number]

## 2011-05-23 LAB — BASIC METABOLIC PANEL
BUN: 15
CO2: 28
Calcium: 9
Chloride: 100
Creatinine, Ser: 0.73
GFR calc Af Amer: >60
GFR calc non Af Amer: >60
Glucose, Bld: 175 — ABNORMAL HIGH
Potassium: 4.2
Sodium: 136

## 2011-05-23 LAB — POCT HEMOGLOBIN-HEMACUE: Hemoglobin: 15.4 — ABNORMAL HIGH

## 2011-05-29 LAB — CBC
HCT: 32.2 — ABNORMAL LOW
HCT: 34.5 — ABNORMAL LOW
HCT: 35.2 — ABNORMAL LOW
HCT: 44.1
Hemoglobin: 11.2 — ABNORMAL LOW
Hemoglobin: 11.8 — ABNORMAL LOW
Hemoglobin: 12.2
Hemoglobin: 14.8
MCHC: 33.5
MCHC: 34.1
MCHC: 34.7
MCHC: 34.8
MCV: 89.3
MCV: 89.7
MCV: 89.9
MCV: 90.6
Platelets: 238
Platelets: 282
Platelets: 294
Platelets: 347
RBC: 3.55 — ABNORMAL LOW
RBC: 3.84 — ABNORMAL LOW
RBC: 3.94
RBC: 4.92
RDW: 13.5
RDW: 14.2
RDW: 14.3
RDW: 14.4
WBC: 10.7 — ABNORMAL HIGH
WBC: 11.9 — ABNORMAL HIGH
WBC: 12.5 — ABNORMAL HIGH
WBC: 12.5 — ABNORMAL HIGH

## 2011-05-29 LAB — COMPREHENSIVE METABOLIC PANEL
ALT: 24
AST: 21
Albumin: 3.6
Alkaline Phosphatase: 55
BUN: 16
CO2: 29
Calcium: 9.5
Chloride: 95 — ABNORMAL LOW
Creatinine, Ser: 0.69
GFR calc Af Amer: >60
GFR calc non Af Amer: >60
Glucose, Bld: 104 — ABNORMAL HIGH
Potassium: 3.6
Sodium: 137
Total Bilirubin: 0.7
Total Protein: 7

## 2011-05-29 LAB — PROTIME-INR
INR: 0.9
INR: 1
INR: 1.1
INR: 1.3
INR: 1.3
Prothrombin Time: 12.6
Prothrombin Time: 13.4
Prothrombin Time: 14.3
Prothrombin Time: 16.2 — ABNORMAL HIGH
Prothrombin Time: 16.8 — ABNORMAL HIGH

## 2011-05-29 LAB — TYPE AND SCREEN
ABO/RH(D): A POS
Antibody Screen: NEGATIVE

## 2011-05-29 LAB — URINALYSIS, ROUTINE W REFLEX MICROSCOPIC
Bilirubin Urine: NEGATIVE
Glucose, UA: NEGATIVE
Hgb urine dipstick: NEGATIVE
Ketones, ur: NEGATIVE
Nitrite: NEGATIVE
Protein, ur: NEGATIVE
Specific Gravity, Urine: 1.021
Urobilinogen, UA: 1
pH: 7

## 2011-05-29 LAB — DIFFERENTIAL
Basophils Absolute: 0
Basophils Relative: 0
Eosinophils Absolute: 0.1
Eosinophils Relative: 1
Lymphocytes Relative: 25
Lymphs Abs: 3.1
Monocytes Absolute: 1
Monocytes Relative: 8
Neutro Abs: 8.2 — ABNORMAL HIGH
Neutrophils Relative %: 66

## 2011-05-29 LAB — APTT: aPTT: 26

## 2011-06-13 LAB — CBC
HCT: 46.1 — ABNORMAL HIGH
HCT: 47.2 — ABNORMAL HIGH
Hemoglobin: 15.6 — ABNORMAL HIGH
Hemoglobin: 16 — ABNORMAL HIGH
MCHC: 33.8
MCHC: 33.8
MCV: 91.2
MCV: 91.9
Platelets: 370
Platelets: 374
RBC: 5.05
RBC: 5.14 — ABNORMAL HIGH
RDW: 12.6
RDW: 12.7
WBC: 10.7 — ABNORMAL HIGH
WBC: 16 — ABNORMAL HIGH

## 2011-06-13 LAB — COMPREHENSIVE METABOLIC PANEL
ALT: 27
ALT: 32
AST: 26
AST: 31
Albumin: 3.5
Albumin: 3.7
Alkaline Phosphatase: 47
Alkaline Phosphatase: 51
BUN: 14
BUN: 14
CO2: 29
CO2: 30
Calcium: 9.2
Calcium: 9.7
Chloride: 95 — ABNORMAL LOW
Chloride: 96
Creatinine, Ser: 0.66
Creatinine, Ser: 0.74
GFR calc Af Amer: >60
GFR calc Af Amer: >60
GFR calc non Af Amer: >60
GFR calc non Af Amer: >60
Glucose, Bld: 108 — ABNORMAL HIGH
Glucose, Bld: 113 — ABNORMAL HIGH
Potassium: 3.3 — ABNORMAL LOW
Potassium: 3.8
Sodium: 133 — ABNORMAL LOW
Sodium: 138
Total Bilirubin: 0.6
Total Bilirubin: 0.8
Total Protein: 7.2
Total Protein: 7.7

## 2011-06-13 LAB — DIFFERENTIAL
Basophils Absolute: 0.1
Basophils Absolute: 0.1
Basophils Relative: 0
Basophils Relative: 1
Eosinophils Absolute: 0.1
Eosinophils Absolute: 0.1
Eosinophils Relative: 1
Eosinophils Relative: 1
Lymphocytes Relative: 16
Lymphocytes Relative: 25
Lymphs Abs: 2.5
Lymphs Abs: 2.6
Monocytes Absolute: 0.7
Monocytes Absolute: 0.9 — ABNORMAL HIGH
Monocytes Relative: 6
Monocytes Relative: 7
Neutro Abs: 12.4 — ABNORMAL HIGH
Neutro Abs: 7.2
Neutrophils Relative %: 67
Neutrophils Relative %: 78 — ABNORMAL HIGH

## 2011-06-13 LAB — TYPE AND SCREEN
ABO/RH(D): A POS
ABO/RH(D): A POS
Antibody Screen: NEGATIVE
Antibody Screen: NEGATIVE

## 2011-06-13 LAB — URINALYSIS, ROUTINE W REFLEX MICROSCOPIC
Bilirubin Urine: NEGATIVE
Glucose, UA: NEGATIVE
Ketones, ur: NEGATIVE
Nitrite: POSITIVE — AB
Protein, ur: NEGATIVE
Specific Gravity, Urine: 1.015
Urobilinogen, UA: 0.2
pH: 6

## 2011-06-13 LAB — APTT
aPTT: 23 — ABNORMAL LOW
aPTT: 24

## 2011-06-13 LAB — PROTIME-INR
INR: 0.9
INR: 0.9
Prothrombin Time: 12.3
Prothrombin Time: 12.4

## 2011-06-13 LAB — URINE MICROSCOPIC-ADD ON

## 2011-06-13 LAB — ABO/RH: ABO/RH(D): A POS

## 2013-02-21 ENCOUNTER — Encounter (HOSPITAL_BASED_OUTPATIENT_CLINIC_OR_DEPARTMENT_OTHER): Payer: Self-pay | Admitting: *Deleted

## 2013-02-21 ENCOUNTER — Emergency Department (HOSPITAL_BASED_OUTPATIENT_CLINIC_OR_DEPARTMENT_OTHER)
Admission: EM | Admit: 2013-02-21 | Discharge: 2013-02-22 | Disposition: A | Discharge status: Home / Self Care | Payer: Medicare Other | Attending: Emergency Medicine | Admitting: Emergency Medicine

## 2013-02-21 ENCOUNTER — Emergency Department (HOSPITAL_BASED_OUTPATIENT_CLINIC_OR_DEPARTMENT_OTHER): Payer: Medicare Other

## 2013-02-21 DIAGNOSIS — E119 Type 2 diabetes mellitus without complications: Secondary | ICD-10-CM | POA: Insufficient documentation

## 2013-02-21 DIAGNOSIS — Z951 Presence of aortocoronary bypass graft: Secondary | ICD-10-CM | POA: Insufficient documentation

## 2013-02-21 DIAGNOSIS — I2510 Atherosclerotic heart disease of native coronary artery without angina pectoris: Secondary | ICD-10-CM | POA: Insufficient documentation

## 2013-02-21 DIAGNOSIS — M109 Gout, unspecified: Secondary | ICD-10-CM | POA: Insufficient documentation

## 2013-02-21 HISTORY — DX: Atherosclerotic heart disease of native coronary artery without angina pectoris: I25.10

## 2013-02-21 HISTORY — DX: Type 2 diabetes mellitus without complications: E11.9

## 2013-02-21 MED ORDER — IBUPROFEN 400 MG PO TABS
400.0000 mg | ORAL_TABLET | Freq: Once | ORAL | Status: AC
  Administered 2013-02-21: 400 mg via ORAL
  Filled 2013-02-21: qty 1

## 2013-02-21 NOTE — ED Notes (Signed)
Pt states swelling with bruising to right pointer finger. States she has this a year ago and had a xray that was negative. States on Friday she had swelling which has gotten worse.  Denies any recent injury. Unable to bend finger and warm to touch. Fevers unknown.

## 2013-02-21 NOTE — ED Provider Notes (Signed)
History     CSN: [id number]  Arrival date & time 02/21/13  2033   First MD Initiated Contact with Patient 02/21/13 2259      Chief Complaint  Patient presents with  . Finger Swelling     (Consider location/radiation/quality/duration/timing/severity/associated sxs/prior treatment) HPI Comments: Pt is an 79 yo woman who has had pain, swelling and redness develop in the right index finger.  This started on Friday, 2 days ago, and has gotten worse over time.  She has had no injury.  She says that about a year ago the same thing happened, she had x-rays that were non-diagnostic, and it got better without treatment.  She has no history of arthritis like gout or rheumatoid arthritis.  She is diabetic on an oral agent, has had multiple prior operations, such as CABG, back surgery, total knee.  The history is provided by the patient.    Past Medical History  Diagnosis Date  . Diabetes mellitus without complication   . Coronary artery disease     Past Surgical History  Procedure Laterality Date  . Cabg    . Appendectomy    . Abdominal hysterectomy    . Back surgery    . Hip surgery      No family history on file.  History  Substance Use Topics  . Smoking status: Never Smoker   . Smokeless tobacco: Not on file  . Alcohol Use: Yes     Comment: occasional     OB History   Grav Para Term Preterm Abortions TAB SAB Ect Mult Living                  Review of Systems  Constitutional: Negative for fever and chills.  HENT: Negative.   Eyes: Negative.   Respiratory: Negative.   Cardiovascular: Negative.   Gastrointestinal: Negative.   Genitourinary: Negative.   Musculoskeletal: Positive for joint swelling.  Skin: Negative.   Neurological: Negative.   Psychiatric/Behavioral: Negative.     Allergies  Review of patient's allergies indicates not on file.  Home Medications   Current Outpatient Rx  Name  Route  Sig  Dispense  Refill  . UNKNOWN TO PATIENT                 BP 139/52  Pulse 62  Temp(Src) 98.7 F (37.1 C) (Oral)  Resp 16  Ht 5\' 1"  (1.549 m)  Wt 138 lb (62.596 kg)  BMI 26.09 kg/m2  SpO2 98%  Physical Exam  Nursing note and vitals reviewed. Constitutional: She is oriented to person, place, and time. She appears well-developed and well-nourished.  lmoderate distress with pain in the right index finger.  HENT:  Head: Normocephalic and atraumatic.  Eyes: Conjunctivae and EOM are normal. Pupils are equal, round, and reactive to light.  Neck: Normal range of motion. Neck supple.  Cardiovascular: Normal rate, regular rhythm and normal heart sounds.   Pulmonary/Chest: Effort normal and breath sounds normal.  Abdominal: Soft. Bowel sounds are normal.  Musculoskeletal:  She has swelling of the right index finger, centering around the PIP joint, with associated redness and tenderness.  She cannot flex her finger. She has intact sensation and good capillary refill in this finger.  Neurological: She is alert and oriented to person, place, and time.  No sensory or motor deficit.    ED Course  Procedures (including critical care time)  Labs Reviewed - No data to display Dg Finger Index Right  02/21/2013   *RADIOLOGY REPORT*  Clinical Data: Pain and redness.  Injury  RIGHT INDEX FINGER 2+V  Comparison: 12/09/2011  Findings: Diffuse soft tissue swelling especially around the PIP joint.  There is degenerative change in the PIP and DIP joint. There is also degenerative change and soft tissue calcification about the first MCP.  No erosion identified.  IMPRESSION: Moderate degenerative change.  Negative for fracture.   Original Report Authenticated By: Carl Best, M.D.   Results for orders placed during the hospital encounter of 02/21/13  CBC WITH DIFFERENTIAL      Result Value Range   WBC 15.1 (*) 4.0 - 10.5 K/uL   RBC 4.02  3.87 - 5.11 MIL/uL   Hemoglobin 12.7  12.0 - 15.0 g/dL   HCT 37.9  36.0 - 46.0 %   MCV 94.3  78.0 - 100.0 fL   MCH 31.6   26.0 - 34.0 pg   MCHC 33.5  30.0 - 36.0 g/dL   RDW 12.9  11.5 - 15.5 %   Platelets 255  150 - 400 K/uL   Neutrophils Relative % 66  43 - 77 %   Neutro Abs 10.0 (*) 1.7 - 7.7 K/uL   Lymphocytes Relative 22  12 - 46 %   Lymphs Abs 3.3  0.7 - 4.0 K/uL   Monocytes Relative 10  3 - 12 %   Monocytes Absolute 1.5 (*) 0.1 - 1.0 K/uL   Eosinophils Relative 2  0 - 5 %   Eosinophils Absolute 0.2  0.0 - 0.7 K/uL   Basophils Relative 0  0 - 1 %   Basophils Absolute 0.1  0.0 - 0.1 K/uL  SEDIMENTATION RATE      Result Value Range   Sed Rate 39 (*) 0 - 22 mm/hr  URIC ACID      Result Value Range   Uric Acid, Serum 11.4 (*) 2.4 - 7.0 mg/dL  RHEUMATOID FACTOR      Result Value Range   Rheumatoid Factor <10  <=14 IU/mL   Dg Finger Index Right  02/21/2013   *RADIOLOGY REPORT*  Clinical Data: Pain and redness.  Injury  RIGHT INDEX FINGER 2+V  Comparison: 12/09/2011  Findings: Diffuse soft tissue swelling especially around the PIP joint.  There is degenerative change in the PIP and DIP joint. There is also degenerative change and soft tissue calcification about the first MCP.  No erosion identified.  IMPRESSION: Moderate degenerative change.  Negative for fracture.   Original Report Authenticated By: Carl Best, M.D.    Lab tests suggested gout.  Treated for that by Dr. Florina Ou, and pt referred to Dr. Andris Flurry, hand surgeon on call.   1. Gout      Mylinda Latina III, MD 02/22/13 [phone]

## 2013-02-21 NOTE — ED Notes (Signed)
MD at bedside. 

## 2013-02-22 LAB — CBC WITH DIFFERENTIAL/PLATELET
Basophils Absolute: 0.1 10*3/uL (ref 0.0–0.1)
Basophils Relative: 0 % (ref 0–1)
Eosinophils Absolute: 0.2 10*3/uL (ref 0.0–0.7)
Eosinophils Relative: 2 % (ref 0–5)
HCT: 37.9 % (ref 36.0–46.0)
Hemoglobin: 12.7 g/dL (ref 12.0–15.0)
Lymphocytes Relative: 22 % (ref 12–46)
Lymphs Abs: 3.3 10*3/uL (ref 0.7–4.0)
MCH: 31.6 pg (ref 26.0–34.0)
MCHC: 33.5 g/dL (ref 30.0–36.0)
MCV: 94.3 fL (ref 78.0–100.0)
Monocytes Absolute: 1.5 10*3/uL — ABNORMAL HIGH (ref 0.1–1.0)
Monocytes Relative: 10 % (ref 3–12)
Neutro Abs: 10 10*3/uL — ABNORMAL HIGH (ref 1.7–7.7)
Neutrophils Relative %: 66 % (ref 43–77)
Platelets: 255 10*3/uL (ref 150–400)
RBC: 4.02 MIL/uL (ref 3.87–5.11)
RDW: 12.9 % (ref 11.5–15.5)
WBC: 15.1 10*3/uL — ABNORMAL HIGH (ref 4.0–10.5)

## 2013-02-22 LAB — RHEUMATOID FACTOR: Rhuematoid fact SerPl-aCnc: <10 IU/mL (ref <=14)

## 2013-02-22 LAB — URIC ACID: Uric Acid, Serum: 11.4 mg/dL — ABNORMAL HIGH (ref 2.4–7.0)

## 2013-02-22 LAB — SEDIMENTATION RATE: Sed Rate: 39 mm/hr — ABNORMAL HIGH (ref 0–22)

## 2013-02-22 MED ORDER — HYDROCODONE-ACETAMINOPHEN 5-325 MG PO TABS: 0.5000 | ORAL_TABLET | ORAL | Status: DC | PRN

## 2013-02-22 MED ORDER — HYDROCODONE-ACETAMINOPHEN 5-325 MG PO TABS
1.0000 | ORAL_TABLET | Freq: Once | ORAL | Status: AC
  Administered 2013-02-22: 1 via ORAL
  Filled 2013-02-22: qty 1

## 2013-02-22 MED ORDER — IBUPROFEN 400 MG PO TABS
400.0000 mg | ORAL_TABLET | Freq: Once | ORAL | Status: AC
  Administered 2013-02-22: 400 mg via ORAL
  Filled 2013-02-22: qty 1

## 2013-02-22 MED ORDER — NAPROXEN SODIUM 220 MG PO TABS: ORAL_TABLET | ORAL | Status: DC

## 2013-02-22 NOTE — ED Notes (Signed)
MD at bedside. 

## 2013-02-22 NOTE — ED Provider Notes (Signed)
Nursing notes and vitals signs, including pulse oximetry, reviewed.  Summary of this visit's results, reviewed by myself:  Labs:  Results for orders placed during the hospital encounter of 02/21/13 (from the past 24 hour(s))  CBC WITH DIFFERENTIAL     Status: Abnormal   Collection Time    02/21/13 11:55 PM      Result Value Range   WBC 15.1 (*) 4.0 - 10.5 K/uL   RBC 4.02  3.87 - 5.11 MIL/uL   Hemoglobin 12.7  12.0 - 15.0 g/dL   HCT 37.9  36.0 - 46.0 %   MCV 94.3  78.0 - 100.0 fL   MCH 31.6  26.0 - 34.0 pg   MCHC 33.5  30.0 - 36.0 g/dL   RDW 12.9  11.5 - 15.5 %   Platelets 255  150 - 400 K/uL   Neutrophils Relative % 66  43 - 77 %   Neutro Abs 10.0 (*) 1.7 - 7.7 K/uL   Lymphocytes Relative 22  12 - 46 %   Lymphs Abs 3.3  0.7 - 4.0 K/uL   Monocytes Relative 10  3 - 12 %   Monocytes Absolute 1.5 (*) 0.1 - 1.0 K/uL   Eosinophils Relative 2  0 - 5 %   Eosinophils Absolute 0.2  0.0 - 0.7 K/uL   Basophils Relative 0  0 - 1 %   Basophils Absolute 0.1  0.0 - 0.1 K/uL  SEDIMENTATION RATE     Status: Abnormal   Collection Time    02/21/13 11:55 PM      Result Value Range   Sed Rate 39 (*) 0 - 22 mm/hr  URIC ACID     Status: Abnormal   Collection Time    02/21/13 11:55 PM      Result Value Range   Uric Acid, Serum 11.4 (*) 2.4 - 7.0 mg/dL    Imaging Studies: Dg Finger Index Right  02/21/2013   *RADIOLOGY REPORT*  Clinical Data: Pain and redness.  Injury  RIGHT INDEX FINGER 2+V  Comparison: 12/09/2011  Findings: Diffuse soft tissue swelling especially around the PIP joint.  There is degenerative change in the PIP and DIP joint. There is also degenerative change and soft tissue calcification about the first MCP.  No erosion identified.  IMPRESSION: Moderate degenerative change.  Negative for fracture.   Original Report Authenticated By: Carl Best, M.D.    1:42 AM Elevated uric acid and recurrence of symptoms in the same finger a year ago is consistent with gout.   Wynetta Fines,  MD 02/22/13 [phone]

## 2014-02-24 ENCOUNTER — Other Ambulatory Visit (HOSPITAL_COMMUNITY): Payer: Self-pay | Admitting: Internal Medicine

## 2014-02-24 DIAGNOSIS — Z1231 Encounter for screening mammogram for malignant neoplasm of breast: Secondary | ICD-10-CM

## 2014-03-09 ENCOUNTER — Ambulatory Visit (HOSPITAL_COMMUNITY)
Admission: RE | Admit: 2014-03-09 | Discharge: 2014-03-09 | Disposition: A | Discharge status: Home / Self Care | Payer: Medicare HMO | Source: Ambulatory Visit | Attending: Internal Medicine | Admitting: Internal Medicine

## 2014-03-09 DIAGNOSIS — Z1231 Encounter for screening mammogram for malignant neoplasm of breast: Secondary | ICD-10-CM | POA: Insufficient documentation

## 2014-03-10 ENCOUNTER — Other Ambulatory Visit: Payer: Self-pay | Admitting: Internal Medicine

## 2014-03-10 DIAGNOSIS — R928 Other abnormal and inconclusive findings on diagnostic imaging of breast: Secondary | ICD-10-CM

## 2014-03-23 ENCOUNTER — Ambulatory Visit
Admission: RE | Admit: 2014-03-23 | Discharge: 2014-03-23 | Disposition: A | Discharge status: Home / Self Care | Payer: Medicare HMO | Source: Ambulatory Visit | Attending: Internal Medicine | Admitting: Internal Medicine

## 2014-03-23 ENCOUNTER — Other Ambulatory Visit: Payer: Self-pay | Admitting: Internal Medicine

## 2014-03-23 DIAGNOSIS — R928 Other abnormal and inconclusive findings on diagnostic imaging of breast: Secondary | ICD-10-CM

## 2014-03-30 ENCOUNTER — Ambulatory Visit
Admission: RE | Admit: 2014-03-30 | Discharge: 2014-03-30 | Disposition: A | Discharge status: Home / Self Care | Payer: Medicare HMO | Source: Ambulatory Visit | Attending: Internal Medicine | Admitting: Internal Medicine

## 2014-03-30 ENCOUNTER — Other Ambulatory Visit: Payer: Self-pay | Admitting: Internal Medicine

## 2014-03-30 DIAGNOSIS — R928 Other abnormal and inconclusive findings on diagnostic imaging of breast: Secondary | ICD-10-CM

## 2014-04-13 ENCOUNTER — Ambulatory Visit (INDEPENDENT_AMBULATORY_CARE_PROVIDER_SITE_OTHER): Payer: Commercial Managed Care - HMO | Admitting: General Surgery

## 2014-04-13 ENCOUNTER — Encounter (INDEPENDENT_AMBULATORY_CARE_PROVIDER_SITE_OTHER): Payer: Self-pay | Admitting: General Surgery

## 2014-04-13 VITALS — BP 122/78 | HR 57 | Temp 98.6°F | Ht 67.0 in | Wt 136.0 lb

## 2014-04-13 DIAGNOSIS — C50919 Malignant neoplasm of unspecified site of unspecified female breast: Secondary | ICD-10-CM

## 2014-04-13 DIAGNOSIS — C50912 Malignant neoplasm of unspecified site of left female breast: Secondary | ICD-10-CM

## 2014-04-13 NOTE — Progress Notes (Signed)
Chief Complaint: New diagnosis of breast cancer  History:    Sandy Owens is a 80 y.o. postmenopausal female referred by Dr. Shelly Bombard  for evaluation of recently diagnosed carcinoma of the left breast. She recently Was found to have a left breast mass by her primary. She states she had felt the lump in her left breast for "years".She then was referred for imaging.She initially had a screening mamogram revealing Distortion in the left breast.  Subsequent imaging included diagnostic mamogram showing Mass in the upper-outer quadrant and ultrasound showing 4.4 x 3.4 cm irregular hypoechoic mass in the upper-outer quadrant..   A ultrasound guided biopsy was performed on 03/31/2014 with pathology revealing infiltrating lobular carcinoma of the breast. She is seen now in The office for initial treatment planning.  She has experienced a breast lump on exam.  She does not have a personal history of any previous breast problems.  Findings at that time were the following:  Tumor size: 4.4 cm  Tumor grade: 2  Estrogen Receptor: positive Progesterone Receptor: negative  Her-2 neu: negative  Lymph node status: negative   Past Medical History  Diagnosis Date  . Diabetes mellitus without complication   . Coronary artery disease     Past Surgical History  Procedure Laterality Date  . Cabg    . Appendectomy    . Abdominal hysterectomy    . Back surgery    . Hip surgery      Current Outpatient Prescriptions  Medication Sig Dispense Refill  . ADVOCATE REDI-CODE test strip       . amLODipine (NORVASC) 5 MG tablet       . atenolol (TENORMIN) 50 MG tablet       . glimepiride (AMARYL) 1 MG tablet       . HYDROcodone-acetaminophen (NORCO/VICODIN) 5-325 MG per tablet Take 0.5-1 tablets by mouth every 4 (four) hours as needed for pain.  20 tablet  0  . levothyroxine (SYNTHROID, LEVOTHROID) 88 MCG tablet       . lisinopril (PRINIVIL,ZESTRIL) 5 MG tablet       . naproxen sodium (ALEVE) 220 MG tablet  Take 2 tablets every 12 hours until gout flare resolves.      . triamterene-hydrochlorothiazide (MAXZIDE-25) 37.5-25 MG per tablet       . UNKNOWN TO PATIENT        No current facility-administered medications for this visit.    History reviewed. No pertinent family history.  History   Social History  . Marital Status: Widowed    Spouse Name: N/A    Number of Children: N/A  . Years of Education: N/A   Social History Main Topics  . Smoking status: Never Smoker   . Smokeless tobacco: None  . Alcohol Use: Yes     Comment: occasional   . Drug Use: No  . Sexual Activity: None   Other Topics Concern  . None   Social History Narrative  . None     Review of Systems Respiratory: positive for dyspnea on exertion Cardiovascular: negative     Objective:  BP 122/78  Pulse 57  Temp(Src) 98.6 F (37 C)  Ht 5' 7"  (1.702 m)  Wt 136 lb (61.689 kg)  BMI 21.30 kg/m2  General: Alert, well-developed Elderly Caucasian female, in no distress Skin: Warm and dry without rash or infection. HEENT: No palpable masses or thyromegaly. Sclera nonicteric. Pupils equal round and reactive. Oropharynx clear. Breasts: There is a fairly large approximately 5 cm movable mass in  the upper outer quadrant of the left breast with some associated bruising secondary to biopsy Lymph nodes: No cervical, supraclavicular, or inguinal nodes palpable. Lungs: Breath sounds clear and equal without increased work of breathing Cardiovascular: Regular rate and rhythm without murmur. No JVD or edema. Peripheral pulses intact. Abdomen: Nondistended. Soft and nontender. No masses palpable. No organomegaly. No palpable hernias. Extremities: No edema or joint swelling or deformity. No chronic venous stasis changes. Neurologic: Alert and fully oriented. Gait normal.   Laboratory data:  CBC:  Lab Results  Component Value Date   WBC 15.1* 02/21/2013   RBC 4.02 02/21/2013   HGB 12.7 02/21/2013   HCT 37.9 02/21/2013    PLT 255 02/21/2013  ]  [facility] Labs:  Lab Results  Component Value Date   NA 137 01/04/2008   K 3.6 01/04/2008   CL 95* 01/04/2008   CO2 29 01/04/2008   BUN 16 01/04/2008   CREATININE 0.69 01/04/2008   CALCIUM 9.5 01/04/2008   PROT 7.0 01/04/2008   BILITOT 0.7 01/04/2008   ALKPHOS 55 01/04/2008   AST 21 01/04/2008   ALT 24 01/04/2008     Assessment  80 y.o. female with a new diagnosis of cancer of the the left breast upper outer quadrant.  Clinical IIA, estrogen receptor positive, progesterone receptor negative and Her2/Neu protein/oncogene negative. I discussed with the patient and family members present today initial surgical treatment options. The patient had originally decided that she wanted nothing done for her breast cancer because of her age. She actually is very active and lives alone with stable medical problems. I told the patient and her daughter that I think no therapy might not be a good choice if she could develop problematic symptoms such as local skin breakdown et Ronney Asters. As far as surgical treatment I believe she would need a total mastectomy. We discussed hormonal therapy alone the patient definitely feels she does not want to take any medications that would cause side effects. If she is willing to do any therapy she would lean toward total mastectomy. Because of the size the tumor I would not recommend lobectomy. At this point she is trying to decide between total mastectomy or no therapy. She will think this over and discuss with her family and the back in touch with me regarding her treatment decisions. I told her if she elected no therapy that I would like to see her back in 6 months and we could try to follow this to see if it is rapidly enlarging in which case she might change her mind..  Plan Patient to call regarding treatment decision  Edward Jolly MD, FACS  04/13/2014, 10:46 AM

## 2014-04-13 NOTE — Patient Instructions (Signed)
Please call back with any questions and with your decisions regarding treatment

## 2014-04-26 ENCOUNTER — Other Ambulatory Visit (INDEPENDENT_AMBULATORY_CARE_PROVIDER_SITE_OTHER): Payer: Self-pay | Admitting: General Surgery

## 2014-04-26 ENCOUNTER — Telehealth (INDEPENDENT_AMBULATORY_CARE_PROVIDER_SITE_OTHER): Payer: Self-pay | Admitting: General Surgery

## 2014-04-26 NOTE — Telephone Encounter (Signed)
Patient's daughter called and we discussed left total mastectomy as initial surgical treatment option and all her questions were answered. She has discussed this with her mother and they would like to proceed with left total mastectomy. Due to her family commitments they would like to schedule this after October 1.

## 2014-04-26 NOTE — Telephone Encounter (Signed)
Tried to call daughter, no answer, left message

## 2014-05-25 ENCOUNTER — Encounter (HOSPITAL_BASED_OUTPATIENT_CLINIC_OR_DEPARTMENT_OTHER): Payer: Self-pay | Admitting: *Deleted

## 2014-05-27 ENCOUNTER — Encounter (HOSPITAL_BASED_OUTPATIENT_CLINIC_OR_DEPARTMENT_OTHER)
Admission: RE | Admit: 2014-05-27 | Discharge: 2014-05-27 | Disposition: A | Discharge status: Home / Self Care | Payer: Medicare HMO | Source: Ambulatory Visit | Attending: General Surgery | Admitting: General Surgery

## 2014-05-27 ENCOUNTER — Other Ambulatory Visit: Payer: Self-pay

## 2014-05-27 DIAGNOSIS — C50919 Malignant neoplasm of unspecified site of unspecified female breast: Secondary | ICD-10-CM | POA: Insufficient documentation

## 2014-05-27 DIAGNOSIS — Z01812 Encounter for preprocedural laboratory examination: Secondary | ICD-10-CM | POA: Insufficient documentation

## 2014-05-27 DIAGNOSIS — Z01810 Encounter for preprocedural cardiovascular examination: Secondary | ICD-10-CM | POA: Insufficient documentation

## 2014-05-27 LAB — BASIC METABOLIC PANEL
Anion gap: 14 (ref 5–15)
BUN: 36 mg/dL — ABNORMAL HIGH (ref 6–23)
CO2: 26 mEq/L (ref 19–32)
Calcium: 9.5 mg/dL (ref 8.4–10.5)
Chloride: 100 mEq/L (ref 96–112)
Creatinine, Ser: 1.38 mg/dL — ABNORMAL HIGH (ref 0.50–1.10)
GFR calc Af Amer: 38 mL/min — ABNORMAL LOW (ref >90)
GFR calc non Af Amer: 33 mL/min — ABNORMAL LOW (ref >90)
Glucose, Bld: 109 mg/dL — ABNORMAL HIGH (ref 70–99)
Potassium: 4.3 mEq/L (ref 3.7–5.3)
Sodium: 140 mEq/L (ref 137–147)

## 2014-05-27 NOTE — Progress Notes (Signed)
Dr. Ashby Dawes called and will fax pt's last EKG. EKG done today and seen by Dr. Al Corpus.

## 2014-06-06 ENCOUNTER — Encounter (HOSPITAL_BASED_OUTPATIENT_CLINIC_OR_DEPARTMENT_OTHER): Payer: Medicare HMO | Admitting: Anesthesiology

## 2014-06-06 ENCOUNTER — Ambulatory Visit (HOSPITAL_BASED_OUTPATIENT_CLINIC_OR_DEPARTMENT_OTHER): Payer: Medicare HMO | Admitting: Anesthesiology

## 2014-06-06 ENCOUNTER — Ambulatory Visit (HOSPITAL_BASED_OUTPATIENT_CLINIC_OR_DEPARTMENT_OTHER)
Admission: RE | Admit: 2014-06-06 | Discharge: 2014-06-07 | Disposition: A | Discharge status: Home / Self Care | Payer: Medicare HMO | Source: Ambulatory Visit | Attending: General Surgery | Admitting: General Surgery

## 2014-06-06 ENCOUNTER — Encounter (HOSPITAL_BASED_OUTPATIENT_CLINIC_OR_DEPARTMENT_OTHER): Payer: Self-pay | Admitting: Anesthesiology

## 2014-06-06 ENCOUNTER — Encounter (HOSPITAL_BASED_OUTPATIENT_CLINIC_OR_DEPARTMENT_OTHER)
Admission: RE | Disposition: A | Discharge status: Home / Self Care | Payer: Self-pay | Source: Ambulatory Visit | Attending: General Surgery

## 2014-06-06 DIAGNOSIS — C50912 Malignant neoplasm of unspecified site of left female breast: Secondary | ICD-10-CM | POA: Insufficient documentation

## 2014-06-06 DIAGNOSIS — Z951 Presence of aortocoronary bypass graft: Secondary | ICD-10-CM | POA: Insufficient documentation

## 2014-06-06 DIAGNOSIS — N6092 Unspecified benign mammary dysplasia of left breast: Secondary | ICD-10-CM | POA: Diagnosis not present

## 2014-06-06 DIAGNOSIS — E119 Type 2 diabetes mellitus without complications: Secondary | ICD-10-CM | POA: Insufficient documentation

## 2014-06-06 DIAGNOSIS — I2510 Atherosclerotic heart disease of native coronary artery without angina pectoris: Secondary | ICD-10-CM | POA: Diagnosis not present

## 2014-06-06 DIAGNOSIS — C50919 Malignant neoplasm of unspecified site of unspecified female breast: Secondary | ICD-10-CM | POA: Diagnosis present

## 2014-06-06 HISTORY — DX: Hypothyroidism, unspecified: E03.9

## 2014-06-06 HISTORY — PX: SIMPLE MASTECTOMY WITH AXILLARY SENTINEL NODE BIOPSY: SHX6098

## 2014-06-06 HISTORY — DX: Myoneural disorder, unspecified: G70.9

## 2014-06-06 HISTORY — DX: Malignant (primary) neoplasm, unspecified: C80.1

## 2014-06-06 HISTORY — DX: Essential (primary) hypertension: I10

## 2014-06-06 LAB — GLUCOSE, CAPILLARY
Glucose-Capillary: 108 mg/dL — ABNORMAL HIGH (ref 70–99)
Glucose-Capillary: 141 mg/dL — ABNORMAL HIGH (ref 70–99)
Glucose-Capillary: 218 mg/dL — ABNORMAL HIGH (ref 70–99)
Glucose-Capillary: 243 mg/dL — ABNORMAL HIGH (ref 70–99)
Glucose-Capillary: 263 mg/dL — ABNORMAL HIGH (ref 70–99)

## 2014-06-06 LAB — POCT HEMOGLOBIN-HEMACUE: Hemoglobin: 15.2 g/dL — ABNORMAL HIGH (ref 12.0–15.0)

## 2014-06-06 SURGERY — SIMPLE MASTECTOMY
Anesthesia: General | Site: Breast | Laterality: Left

## 2014-06-06 MED ORDER — SCOPOLAMINE 1 MG/3DAYS TD PT72
MEDICATED_PATCH | TRANSDERMAL | Status: AC
  Filled 2014-06-06: qty 1

## 2014-06-06 MED ORDER — PROPOFOL 10 MG/ML IV BOLUS
INTRAVENOUS | Status: DC | PRN
  Administered 2014-06-06: 150 mg via INTRAVENOUS

## 2014-06-06 MED ORDER — MIDAZOLAM HCL 5 MG/5ML IJ SOLN
INTRAMUSCULAR | Status: DC | PRN
  Administered 2014-06-06: 1 mg via INTRAVENOUS

## 2014-06-06 MED ORDER — HYDROMORPHONE HCL 1 MG/ML IJ SOLN
INTRAMUSCULAR | Status: AC
  Filled 2014-06-06: qty 1

## 2014-06-06 MED ORDER — MIDAZOLAM HCL 2 MG/2ML IJ SOLN: 1.0000 mg | INTRAMUSCULAR | Status: DC | PRN

## 2014-06-06 MED ORDER — LIDOCAINE HCL (CARDIAC) 20 MG/ML IV SOLN
INTRAVENOUS | Status: DC | PRN
  Administered 2014-06-06: 50 mg via INTRAVENOUS

## 2014-06-06 MED ORDER — FENTANYL CITRATE 0.05 MG/ML IJ SOLN
INTRAMUSCULAR | Status: DC | PRN
  Administered 2014-06-06: 100 ug via INTRAVENOUS
  Administered 2014-06-06: 25 ug via INTRAVENOUS

## 2014-06-06 MED ORDER — CHLORHEXIDINE GLUCONATE 4 % EX LIQD: 1.0000 "application " | Freq: Once | CUTANEOUS | Status: DC

## 2014-06-06 MED ORDER — INSULIN ASPART 100 UNIT/ML ~~LOC~~ SOLN
0.0000 [IU] | Freq: Three times a day (TID) | SUBCUTANEOUS | Status: DC
  Administered 2014-06-06 (×2): 5 [IU] via SUBCUTANEOUS
  Filled 2014-06-06: qty 1

## 2014-06-06 MED ORDER — INSULIN ASPART 100 UNIT/ML ~~LOC~~ SOLN
SUBCUTANEOUS | Status: AC
  Filled 2014-06-06: qty 1

## 2014-06-06 MED ORDER — LISINOPRIL 5 MG PO TABS
5.0000 mg | ORAL_TABLET | Freq: Every day | ORAL | Status: DC
  Administered 2014-06-06: 5 mg via ORAL

## 2014-06-06 MED ORDER — ATENOLOL 50 MG PO TABS
50.0000 mg | ORAL_TABLET | Freq: Two times a day (BID) | ORAL | Status: DC
  Administered 2014-06-06 (×2): 50 mg via ORAL

## 2014-06-06 MED ORDER — LEVOTHYROXINE SODIUM 88 MCG PO TABS: 88.0000 ug | ORAL_TABLET | Freq: Every day | ORAL | Status: DC

## 2014-06-06 MED ORDER — ONDANSETRON HCL 4 MG PO TABS: 4.0000 mg | ORAL_TABLET | Freq: Four times a day (QID) | ORAL | Status: DC | PRN

## 2014-06-06 MED ORDER — SODIUM CHLORIDE 0.9 % IV SOLN
INTRAVENOUS | Status: DC
  Administered 2014-06-06: 11:00:00 via INTRAVENOUS

## 2014-06-06 MED ORDER — MIDAZOLAM HCL 2 MG/2ML IJ SOLN
INTRAMUSCULAR | Status: AC
  Filled 2014-06-06: qty 2

## 2014-06-06 MED ORDER — SCOPOLAMINE 1 MG/3DAYS TD PT72
1.0000 | MEDICATED_PATCH | TRANSDERMAL | Status: DC
  Administered 2014-06-06: 1.5 mg via TRANSDERMAL

## 2014-06-06 MED ORDER — CEFAZOLIN SODIUM-DEXTROSE 2-3 GM-% IV SOLR
2.0000 g | INTRAVENOUS | Status: AC
Start: 2014-06-06 — End: 2014-06-06
  Administered 2014-06-06: 2 g via INTRAVENOUS

## 2014-06-06 MED ORDER — ONDANSETRON HCL 4 MG/2ML IJ SOLN: 4.0000 mg | Freq: Four times a day (QID) | INTRAMUSCULAR | Status: DC | PRN

## 2014-06-06 MED ORDER — ONDANSETRON HCL 4 MG/2ML IJ SOLN
INTRAMUSCULAR | Status: DC | PRN
  Administered 2014-06-06: 4 mg via INTRAVENOUS

## 2014-06-06 MED ORDER — HYDROCODONE-ACETAMINOPHEN 5-325 MG PO TABS
1.0000 | ORAL_TABLET | ORAL | Status: DC | PRN
  Administered 2014-06-06: 2 via ORAL
  Administered 2014-06-06: 1 via ORAL
  Filled 2014-06-06: qty 2
  Filled 2014-06-06: qty 1

## 2014-06-06 MED ORDER — FENTANYL CITRATE 0.05 MG/ML IJ SOLN: 50.0000 ug | INTRAMUSCULAR | Status: DC | PRN

## 2014-06-06 MED ORDER — CEFAZOLIN SODIUM-DEXTROSE 2-3 GM-% IV SOLR
INTRAVENOUS | Status: AC
  Filled 2014-06-06: qty 50

## 2014-06-06 MED ORDER — TRIAMTERENE-HCTZ 37.5-25 MG PO TABS
1.0000 | ORAL_TABLET | Freq: Every day | ORAL | Status: DC
Start: 2014-06-06 — End: 2014-06-07
  Administered 2014-06-06: 1 via ORAL

## 2014-06-06 MED ORDER — HEPARIN SODIUM (PORCINE) 5000 UNIT/ML IJ SOLN
5000.0000 [IU] | Freq: Three times a day (TID) | INTRAMUSCULAR | Status: DC
  Administered 2014-06-06 – 2014-06-07 (×2): 5000 [IU] via SUBCUTANEOUS

## 2014-06-06 MED ORDER — PROMETHAZINE HCL 25 MG/ML IJ SOLN: 6.2500 mg | INTRAMUSCULAR | Status: DC | PRN

## 2014-06-06 MED ORDER — DIPHENHYDRAMINE HCL 50 MG/ML IJ SOLN
INTRAMUSCULAR | Status: DC | PRN
  Administered 2014-06-06: 6.25 mg via INTRAVENOUS

## 2014-06-06 MED ORDER — EPHEDRINE SULFATE 50 MG/ML IJ SOLN
INTRAMUSCULAR | Status: DC | PRN
  Administered 2014-06-06: 10 mg via INTRAVENOUS

## 2014-06-06 MED ORDER — LACTATED RINGERS IV SOLN
INTRAVENOUS | Status: DC
  Administered 2014-06-06: 08:00:00 via INTRAVENOUS

## 2014-06-06 MED ORDER — AMLODIPINE BESYLATE 5 MG PO TABS
5.0000 mg | ORAL_TABLET | Freq: Every day | ORAL | Status: DC
  Administered 2014-06-06: 5 mg via ORAL

## 2014-06-06 MED ORDER — MORPHINE SULFATE 2 MG/ML IJ SOLN: 1.0000 mg | INTRAMUSCULAR | Status: DC | PRN

## 2014-06-06 MED ORDER — HYDROMORPHONE HCL 1 MG/ML IJ SOLN
0.2500 mg | INTRAMUSCULAR | Status: DC | PRN
  Administered 2014-06-06 (×2): 0.25 mg via INTRAVENOUS

## 2014-06-06 MED ORDER — DEXAMETHASONE SODIUM PHOSPHATE 4 MG/ML IJ SOLN
INTRAMUSCULAR | Status: DC | PRN
  Administered 2014-06-06: 10 mg via INTRAVENOUS

## 2014-06-06 MED ORDER — FENTANYL CITRATE 0.05 MG/ML IJ SOLN
INTRAMUSCULAR | Status: AC
  Filled 2014-06-06: qty 4

## 2014-06-06 MED ORDER — HYDROCODONE-ACETAMINOPHEN 5-325 MG PO TABS: 1.0000 | ORAL_TABLET | ORAL | Status: DC | PRN

## 2014-06-06 MED ORDER — OXYCODONE HCL 5 MG/5ML PO SOLN: 5.0000 mg | Freq: Once | ORAL | Status: AC | PRN

## 2014-06-06 MED ORDER — FENTANYL CITRATE 0.05 MG/ML IJ SOLN
INTRAMUSCULAR | Status: AC
  Filled 2014-06-06: qty 2

## 2014-06-06 MED ORDER — OXYCODONE HCL 5 MG PO TABS: 5.0000 mg | ORAL_TABLET | Freq: Once | ORAL | Status: AC | PRN

## 2014-06-06 SURGICAL SUPPLY — 59 items
APL SKNCLS STERI-STRIP NONHPOA (GAUZE/BANDAGES/DRESSINGS)
APPLIER CLIP 11 MED OPEN (CLIP)
APPLIER CLIP 9.375 MED OPEN (MISCELLANEOUS)
BENZOIN TINCTURE PRP APPL 2/3 (GAUZE/BANDAGES/DRESSINGS) IMPLANT
BINDER BREAST LRG (GAUZE/BANDAGES/DRESSINGS) ×2 IMPLANT
BLADE CLIPPER SURG (BLADE) IMPLANT
BLADE HEX COATED 2.75 (ELECTRODE) ×2 IMPLANT
BLADE SURG 10 STRL SS (BLADE) ×2 IMPLANT
BLADE SURG 15 STRL LF DISP TIS (BLADE) ×1 IMPLANT
BLADE SURG 15 STRL SS (BLADE) ×2
CANISTER SUCT 1200ML W/VALVE (MISCELLANEOUS) ×2 IMPLANT
CHLORAPREP W/TINT 26ML (MISCELLANEOUS) ×2 IMPLANT
CLIP APPLIE 11 MED OPEN (CLIP) IMPLANT
CLIP APPLIE 9.375 MED OPEN (MISCELLANEOUS) IMPLANT
COVER MAYO STAND STRL (DRAPES) ×2 IMPLANT
COVER TABLE BACK 60X90 (DRAPES) ×2 IMPLANT
DECANTER SPIKE VIAL GLASS SM (MISCELLANEOUS) IMPLANT
DRAIN CHANNEL 19F RND (DRAIN) ×2 IMPLANT
DRAIN HEMOVAC 1/8 X 5 (WOUND CARE) IMPLANT
DRAPE LAPAROSCOPIC ABDOMINAL (DRAPES) ×2 IMPLANT
DRAPE UTILITY XL STRL (DRAPES) ×2 IMPLANT
ELECT REM PT RETURN 9FT ADLT (ELECTROSURGICAL) ×2
ELECTRODE REM PT RTRN 9FT ADLT (ELECTROSURGICAL) ×1 IMPLANT
EVACUATOR SILICONE 100CC (DRAIN) ×2 IMPLANT
GAUZE SPONGE 4X4 12PLY STRL (GAUZE/BANDAGES/DRESSINGS) ×2 IMPLANT
GAUZE VASELINE 3X9 (GAUZE/BANDAGES/DRESSINGS) ×2 IMPLANT
GLOVE BIOGEL PI IND STRL 7.0 (GLOVE) ×2 IMPLANT
GLOVE BIOGEL PI IND STRL 8 (GLOVE) ×1 IMPLANT
GLOVE BIOGEL PI INDICATOR 7.0 (GLOVE) ×2
GLOVE BIOGEL PI INDICATOR 8 (GLOVE) ×1
GLOVE ECLIPSE 6.5 STRL STRAW (GLOVE) ×2 IMPLANT
GLOVE SS BIOGEL STRL SZ 7.5 (GLOVE) ×1 IMPLANT
GLOVE SUPERSENSE BIOGEL SZ 7.5 (GLOVE) ×1
GOWN STRL REUS W/ TWL LRG LVL3 (GOWN DISPOSABLE) ×1 IMPLANT
GOWN STRL REUS W/ TWL XL LVL3 (GOWN DISPOSABLE) ×1 IMPLANT
GOWN STRL REUS W/TWL LRG LVL3 (GOWN DISPOSABLE) ×1
GOWN STRL REUS W/TWL XL LVL3 (GOWN DISPOSABLE) ×1
NEEDLE HYPO 25X1 1.5 SAFETY (NEEDLE) ×2 IMPLANT
NS IRRIG 1000ML POUR BTL (IV SOLUTION) ×2 IMPLANT
PACK BASIN DAY SURGERY FS (CUSTOM PROCEDURE TRAY) ×2 IMPLANT
PENCIL BUTTON HOLSTER BLD 10FT (ELECTRODE) ×2 IMPLANT
PIN SAFETY STERILE (MISCELLANEOUS) ×2 IMPLANT
SLEEVE SCD COMPRESS KNEE MED (MISCELLANEOUS) ×2 IMPLANT
SPONGE LAP 18X18 X RAY DECT (DISPOSABLE) ×4 IMPLANT
SPONGE LAP 4X18 X RAY DECT (DISPOSABLE) IMPLANT
STAPLER VISISTAT 35W (STAPLE) ×2 IMPLANT
STRIP CLOSURE SKIN 1/2X4 (GAUZE/BANDAGES/DRESSINGS) IMPLANT
SUT ETHILON 3 0 PS 1 (SUTURE) ×2 IMPLANT
SUT MON AB 4-0 PC3 18 (SUTURE) ×4 IMPLANT
SUT SILK 2 0 SH (SUTURE) IMPLANT
SUT VIC AB 3-0 SH 27 (SUTURE) ×1
SUT VIC AB 3-0 SH 27X BRD (SUTURE) ×1 IMPLANT
SUT VICRYL 3-0 CR8 SH (SUTURE) ×2 IMPLANT
SUT VICRYL AB 3 0 TIES (SUTURE) ×2 IMPLANT
SYR CONTROL 10ML LL (SYRINGE) ×2 IMPLANT
TOWEL OR 17X24 6PK STRL BLUE (TOWEL DISPOSABLE) ×2 IMPLANT
TOWEL OR NON WOVEN STRL DISP B (DISPOSABLE) ×2 IMPLANT
TUBE CONNECTING 20X1/4 (TUBING) ×2 IMPLANT
YANKAUER SUCT BULB TIP NO VENT (SUCTIONS) ×4 IMPLANT

## 2014-06-06 NOTE — Addendum Note (Signed)
Addendum created 06/06/14 1219 by Willa Frater, CRNA   Modules edited: Charges VN

## 2014-06-06 NOTE — Transfer of Care (Signed)
Immediate Anesthesia Transfer of Care Note  Patient: Sandy Owens  Procedure(s) Performed: Procedure(s): LEFT SIMPLE MASTECTOMY (Left)  Patient Location: PACU  Anesthesia Type:General  Level of Consciousness: awake, alert  and patient cooperative  Airway & Oxygen Therapy: Patient Spontanous Breathing and Patient connected to face mask oxygen  Post-op Assessment: Report given to PACU RN and Post -op Vital signs reviewed and stable  Post vital signs: Reviewed and stable  Complications: No apparent anesthesia complications

## 2014-06-06 NOTE — Anesthesia Preprocedure Evaluation (Signed)
Anesthesia Evaluation    History of Anesthesia Complications Negative for: history of anesthetic complications  Airway       Dental   Pulmonary former smoker,          Cardiovascular hypertension, + CAD and + CABG     Neuro/Psych negative neurological ROS  negative psych ROS   GI/Hepatic negative GI ROS, Neg liver ROS,   Endo/Other  diabetesHypothyroidism   Renal/GU negative Renal ROS     Musculoskeletal   Abdominal   Peds  Hematology   Anesthesia Other Findings   Reproductive/Obstetrics                           Anesthesia Physical Anesthesia Plan  ASA: III  Anesthesia Plan: General   Post-op Pain Management:    Induction: Intravenous  Airway Management Planned: LMA  Additional Equipment:   Intra-op Plan:   Post-operative Plan: Extubation in OR  Informed Consent:   Plan Discussed with:   Anesthesia Plan Comments:         Anesthesia Quick Evaluation

## 2014-06-06 NOTE — Anesthesia Procedure Notes (Signed)
Procedure Name: LMA Insertion Date/Time: 06/06/2014 8:45 AM Performed by: Melynda Ripple D Pre-anesthesia Checklist: Patient identified, Emergency Drugs available, Suction available and Patient being monitored Patient Re-evaluated:Patient Re-evaluated prior to inductionOxygen Delivery Method: Circle System Utilized Preoxygenation: Pre-oxygenation with 100% oxygen Intubation Type: IV induction Ventilation: Mask ventilation without difficulty LMA: LMA inserted LMA Size: 4.0 Number of attempts: 1 Airway Equipment and Method: bite block Placement Confirmation: positive ETCO2 Tube secured with: Tape Dental Injury: Teeth and Oropharynx as per pre-operative assessment

## 2014-06-06 NOTE — Anesthesia Postprocedure Evaluation (Signed)
Anesthesia Post Note  Patient: Sandy Owens  Procedure(s) Performed: Procedure(s) (LRB): LEFT SIMPLE MASTECTOMY (Left)  Anesthesia type: General  Patient location: PACU  Post pain: Pain level controlled and Adequate analgesia  Post assessment: Post-op Vital signs reviewed, Patient's Cardiovascular Status Stable, Respiratory Function Stable, Patent Airway and Pain level controlled  Last Vitals:  Filed Vitals:   06/06/14 1045  BP:   Pulse: 60  Temp:   Resp: 17    Post vital signs: Reviewed and stable  Level of consciousness: awake, alert  and oriented  Complications: No apparent anesthesia complications

## 2014-06-06 NOTE — Op Note (Signed)
Preoperative Diagnosis: left breast cancer  Postoprative Diagnosis: left breast cancer  Procedure: Procedure(s): LEFT SIMPLE MASTECTOMY   Surgeon: Excell Seltzer T   Assistants: none  Anesthesia:  General LMA anesthesia  Indications:  Patient is an 80 year old female in generally good health with a recently diagnosed approximately 5.5 cm invasive ductal carcinoma of the left breast. After extensive discussion regarding treatment options detailed elsewhere we have elected to proceed with left simple mastectomy.    Procedure Detail:  Patient was brought to the operating room, placed in supine position on the operating table, and laryngeal mask general anesthesia induced. She received preoperative IV antibiotics. PAS were in place. The left breast chest and upper arm were widely sterilely prepped and draped. Patient time out was performed and correct procedure verified. I used a elliptical obliquely oriented incision encompassing the nipple areolar complex and extending up toward the axilla. Skin and subcutaneous flaps were raised with cautery superiorly up toward the clavicle, medially to the edge of the sternum, inferiorly to the inframammary crease and laterally out to the anterior border of the latissimus dorsi which was completely dissected free. The breast was then reflected off the chest wall with cautery working medial to lateral. Attachments along the lateral border of the pectoralis were divided mobilizing this and the specimen was dissected off the serratus and latissimus. The specimen was isolated up to the low axilla and I came across the low axilla with Kelly clamps and tied this with 3-0 Vicryl and the specimen was removed. It was oriented and sent for permanent pathology. The wound was thoroughly irrigated and complete hemostasis obtained. A closed suction 19 Blake drain was left a separate stab wound beneath the flaps. The subcutaneous tissue was closed with interrupted 3-0 Vicryl  and skin with staples. Sponge needle and instrument counts were correct.    Findings: As above  Estimated Blood Loss:  Minimal         Drains: 19 round Blake drain x1  Blood Given: none          Specimens: left total mastectomy        Complications:  * No complications entered in OR log *         Disposition: PACU - hemodynamically stable.         Condition: stable

## 2014-06-06 NOTE — H&P (Signed)
History:   Sandy Owens is a 80 y.o. postmenopausal female referred by Dr. Shelly Bombard for evaluation of recently diagnosed carcinoma of the left breast. She recently Was found to have a left breast mass by her primary. She states she had felt the lump in her left breast for "years".She then was referred for imaging.She initially had a screening mamogram revealing Distortion in the left breast. Subsequent imaging included diagnostic mamogram showing Mass in the upper-outer quadrant and ultrasound showing 4.4 x 3.4 cm irregular hypoechoic mass in the upper-outer quadrant.. A ultrasound guided biopsy was performed on 03/31/2014 with pathology revealing infiltrating lobular carcinoma of the breast. She is seen now in The office for initial treatment planning. She has experienced a breast lump on exam. She does not have a personal history of any previous breast problems.   Findings at that time were the following:  Tumor size: 4.4 cm  Tumor grade: 2  Estrogen Receptor: positive  Progesterone Receptor: negative  Her-2 neu: negative  Lymph node status: negative   Past Medical History   Diagnosis  Date   .  Diabetes mellitus without complication    .  Coronary artery disease     Past Surgical History   Procedure  Laterality  Date   .  Cabg     .  Appendectomy     .  Abdominal hysterectomy     .  Back surgery     .  Hip surgery      Current Outpatient Prescriptions   Medication  Sig  Dispense  Refill   .  ADVOCATE REDI-CODE test strip      .  amLODipine (NORVASC) 5 MG tablet      .  atenolol (TENORMIN) 50 MG tablet      .  glimepiride (AMARYL) 1 MG tablet      .  HYDROcodone-acetaminophen (NORCO/VICODIN) 5-325 MG per tablet  Take 0.5-1 tablets by mouth every 4 (four) hours as needed for pain.  20 tablet  0   .  levothyroxine (SYNTHROID, LEVOTHROID) 88 MCG tablet      .  lisinopril (PRINIVIL,ZESTRIL) 5 MG tablet      .  naproxen sodium (ALEVE) 220 MG tablet  Take 2 tablets every 12 hours  until gout flare resolves.     .  triamterene-hydrochlorothiazide (MAXZIDE-25) 37.5-25 MG per tablet      .  UNKNOWN TO PATIENT       No current facility-administered medications for this visit.   History reviewed. No pertinent family history.  History    Social History   .  Marital Status:  Widowed     Spouse Name:  N/A     Number of Children:  N/A   .  Years of Education:  N/A    Social History Main Topics   .  Smoking status:  Never Smoker   .  Smokeless tobacco:  None   .  Alcohol Use:  Yes      Comment: occasional   .  Drug Use:  No   .  Sexual Activity:  None    Other Topics  Concern   .  None    Social History Narrative   .  None   Review of Systems  Respiratory: positive for dyspnea on exertion  Cardiovascular: negative   Objective:   BP 161/49  Pulse 58  Temp(Src) 98.4 F (36.9 C) (Oral)  Resp 19  Ht 4' 9"  (1.448 m)  Wt 137 lb (62.143  kg)  BMI 29.64 kg/m2  SpO2 100%  General: Alert, well-developed Elderly Caucasian female, in no distress  Skin: Warm and dry without rash or infection.  HEENT: No palpable masses or thyromegaly. Sclera nonicteric. Pupils equal round and reactive. Oropharynx clear.  Breasts: There is a fairly large approximately 5 cm movable mass in the upper outer quadrant of the left breast with some associated bruising secondary to biopsy  Lymph nodes: No cervical, supraclavicular, or inguinal nodes palpable.  Lungs: Breath sounds clear and equal without increased work of breathing  Cardiovascular: Regular rate and rhythm without murmur. No JVD or edema. Peripheral pulses intact.  Abdomen: Nondistended. Soft and nontender. No masses palpable. No organomegaly. No palpable hernias.  Extremities: No edema or joint swelling or deformity. No chronic venous stasis changes.  Neurologic: Alert and fully oriented. Gait normal.  Laboratory data:  CBC:  Lab Results   Component  Value  Date    WBC  15.1*  02/21/2013    RBC  4.02  02/21/2013    HGB   12.7  02/21/2013    HCT  37.9  02/21/2013    PLT  255  02/21/2013   ]  [facility] Labs:  Lab Results   Component  Value  Date    NA  137  01/04/2008    K  3.6  01/04/2008    CL  95*  01/04/2008    CO2  29  01/04/2008    BUN  16  01/04/2008    CREATININE  0.69  01/04/2008    CALCIUM  9.5  01/04/2008    PROT  7.0  01/04/2008    BILITOT  0.7  01/04/2008    ALKPHOS  55  01/04/2008    AST  21  01/04/2008    ALT  24  01/04/2008   Assessment  80 y.o. female with a new diagnosis of cancer of the the left breast upper outer quadrant. Clinical IIA, estrogen receptor positive, progesterone receptor negative and Her2/Neu protein/oncogene negative. I discussed with the patient and family members present today initial surgical treatment options. The patient had originally decided that she wanted nothing done for her breast cancer because of her age. She actually is very active and lives alone with stable medical problems. I told the patient and her daughter that I think no therapy might not be a good choice as she could develop problematic symptoms such as local skin breakdown et Ronney Asters. As far as surgical treatment I believe she would need a total mastectomy. We discussed hormonal therapy alone the patient definitely feels she does not want to take any medications that would cause side effects.Patient has subsequently decided to proceed with left total mastectomy..  Plan  Left total mastectomy under general anesthesia with an overnight hospitalization.

## 2014-06-07 ENCOUNTER — Encounter (HOSPITAL_BASED_OUTPATIENT_CLINIC_OR_DEPARTMENT_OTHER): Payer: Self-pay | Admitting: General Surgery

## 2014-06-07 DIAGNOSIS — C50912 Malignant neoplasm of unspecified site of left female breast: Secondary | ICD-10-CM | POA: Diagnosis not present

## 2014-06-07 LAB — GLUCOSE, CAPILLARY: Glucose-Capillary: 137 mg/dL — ABNORMAL HIGH (ref 70–99)

## 2014-06-07 MED ORDER — HEPARIN SODIUM (PORCINE) 5000 UNIT/ML IJ SOLN
INTRAMUSCULAR | Status: AC
  Filled 2014-06-07: qty 1

## 2014-06-07 NOTE — Discharge Instructions (Signed)
[facility] °[phone] ° °MASTECTOMY: POST OP INSTRUCTIONS ° °Always review your discharge instruction sheet given to you by the facility where your surgery was performed. °IF YOU HAVE DISABILITY OR FAMILY LEAVE FORMS, YOU MUST BRING THEM TO THE OFFICE FOR PROCESSING.   °DO NOT GIVE THEM TO YOUR DOCTOR. °A prescription for pain medication may be given to you upon discharge.  Take your pain medication as prescribed, if needed.  If narcotic pain medicine is not needed, then you may take acetaminophen (Tylenol) or ibuprofen (Advil) as needed. °1. Take your usually prescribed medications unless otherwise directed. °2. If you need a refill on your pain medication, please contact your pharmacy.  They will contact our office to request authorization.  Prescriptions will not be filled after 5pm or on week-ends. °3. You should follow a light diet the first few days after arrival home, such as soup and crackers, etc.  Resume your normal diet the day after surgery. °4. Most patients will experience some swelling and bruising on the chest and underarm.  Ice packs will help.  Swelling and bruising can take several days to resolve.  °5. It is common to experience some constipation if taking pain medication after surgery.  Increasing fluid intake and taking a stool softener (such as Colace) will usually help or prevent this problem from occurring.  A mild laxative (Milk of Magnesia or Miralax) should be taken according to package instructions if there are no bowel movements after 48 hours. °6. Unless discharge instructions indicate otherwise, leave your bandage dry and in place until your next appointment in 3-5 days.  You may take a limited sponge bath.  No tube baths or showers until the drains are removed.  You may have steri-strips (small skin tapes) in place directly over the incision.  These strips should be left on the skin for 7-10 days.  If your surgeon used skin glue on the incision, you may  shower in 24 hours.  The glue will flake off over the next 2-3 weeks.  Any sutures or staples will be removed at the office during your follow-up visit. °7. DRAINS:  If you have drains in place, it is important to keep a list of the amount of drainage produced each day in your drains.  Before leaving the hospital, you should be instructed on drain care.  Call our office if you have any questions about your drains. °8. ACTIVITIES:  You may resume regular (light) daily activities beginning the next day--such as daily self-care, walking, climbing stairs--gradually increasing activities as tolerated.  You may have sexual intercourse when it is comfortable.  Refrain from any heavy lifting or straining until approved by your doctor. °a. You may drive when you are no longer taking prescription pain medication, you can comfortably wear a seatbelt, and you can safely maneuver your car and apply brakes. °b. RETURN TO WORK:  __________________________________________________________ °9. You should see your doctor in the office for a follow-up appointment approximately 3-5 days after your surgery.  Your doctor’s nurse will typically make your follow-up appointment when she calls you with your pathology report.  Expect your pathology report 2-3 business days after your surgery.  You may call to check if you do not hear from us after three days.   °10. OTHER INSTRUCTIONS: ______________________________________________________________________________________________ ____________________________________________________________________________________________ ° °WHEN TO CALL YOUR DOCTOR: °1. Fever over 101.0 °2. Nausea and/or vomiting °3. Extreme swelling or bruising °4. Continued bleeding from incision. °5. Increased pain, redness, or drainage from the   incision. ° °The clinic staff is available to answer your questions during regular business hours.  Please don’t hesitate to call and ask to speak to one of the nurses for clinical  concerns.  If you have a medical emergency, go to the nearest emergency room or call 911.  A surgeon from [facility] is always on call at the hospital. °[address], [address], [place], [place]  [address] ? [address] [address], [place], [place]   [address] °[phone] ? [phone] ? FAX [phone] °Web site: [facility] ° °About my Jackson-Pratt Bulb Drain ° °What is a Jackson-Pratt bulb? °A Jackson-Pratt is a soft, round device used to collect drainage. It is connected to a long, thin drainage catheter, which is held in place by one or two small stiches near your surgical incision site. When the bulb is squeezed, it forms a vacuum, forcing the drainage to empty into the bulb. ° °Emptying the Jackson-Pratt bulb- °To empty the bulb: °1. Release the plug on the top of the bulb. °2. Pour the bulb's contents into a measuring container which your nurse will provide. °3. Record the time emptied and amount of drainage. Empty the drain(s) as often as your     doctor or nurse recommends. ° °Date                  Time                    Amount (Drain 1)                 Amount (Drain 2) ° °_____________________________________________________________________ ° °_____________________________________________________________________ ° °_____________________________________________________________________ ° °_____________________________________________________________________ ° °_____________________________________________________________________ ° °_____________________________________________________________________ ° °_____________________________________________________________________ ° °_____________________________________________________________________ ° °Squeezing the Jackson-Pratt Bulb- °To squeeze the bulb: °1. Make sure the plug at the top of the bulb is open. °2. Squeeze the bulb tightly in your fist. You will hear air squeezing from the bulb. °3. Replace the plug while the bulb is squeezed. °4.  Use a safety pin to attach the bulb to your clothing. This will keep the catheter from     pulling at the bulb insertion site. ° °When to call your doctor- °Call your doctor if: °· Drain site becomes red, swollen or hot. °· You have a fever greater than 101 degrees F. °· There is oozing at the drain site. °· Drain falls out (apply a guaze bandage over the drain hole and secure it with tape). °· Drainage increases daily not related to activity patterns. (You will usually have more drainage when you are active than when you are resting.) °· Drainage has a bad odor. ° °

## 2014-06-08 ENCOUNTER — Telehealth (INDEPENDENT_AMBULATORY_CARE_PROVIDER_SITE_OTHER): Payer: Self-pay | Admitting: General Surgery

## 2014-06-08 NOTE — Telephone Encounter (Signed)
Called pt, doing well, discussed path report

## 2014-07-07 ENCOUNTER — Other Ambulatory Visit (INDEPENDENT_AMBULATORY_CARE_PROVIDER_SITE_OTHER): Payer: Self-pay

## 2014-07-07 DIAGNOSIS — Z9012 Acquired absence of left breast and nipple: Secondary | ICD-10-CM

## 2014-07-07 MED ORDER — UNABLE TO FIND
Status: DC
Start: 2014-07-07 — End: 2017-12-04

## 2014-09-14 DIAGNOSIS — E785 Hyperlipidemia, unspecified: Secondary | ICD-10-CM | POA: Diagnosis not present

## 2014-09-14 DIAGNOSIS — E119 Type 2 diabetes mellitus without complications: Secondary | ICD-10-CM | POA: Diagnosis not present

## 2014-09-21 DIAGNOSIS — I10 Essential (primary) hypertension: Secondary | ICD-10-CM | POA: Diagnosis not present

## 2014-09-21 DIAGNOSIS — E782 Mixed hyperlipidemia: Secondary | ICD-10-CM | POA: Diagnosis not present

## 2014-09-21 DIAGNOSIS — E039 Hypothyroidism, unspecified: Secondary | ICD-10-CM | POA: Diagnosis not present

## 2014-09-21 DIAGNOSIS — E119 Type 2 diabetes mellitus without complications: Secondary | ICD-10-CM | POA: Diagnosis not present

## 2014-12-14 DIAGNOSIS — H3531 Nonexudative age-related macular degeneration: Secondary | ICD-10-CM | POA: Diagnosis not present

## 2015-01-11 DIAGNOSIS — C50912 Malignant neoplasm of unspecified site of left female breast: Secondary | ICD-10-CM | POA: Diagnosis not present

## 2015-01-12 ENCOUNTER — Other Ambulatory Visit (INDEPENDENT_AMBULATORY_CARE_PROVIDER_SITE_OTHER): Payer: Self-pay

## 2015-01-31 ENCOUNTER — Other Ambulatory Visit: Payer: Self-pay

## 2015-01-31 DIAGNOSIS — Z9012 Acquired absence of left breast and nipple: Secondary | ICD-10-CM

## 2015-01-31 DIAGNOSIS — Z1231 Encounter for screening mammogram for malignant neoplasm of breast: Secondary | ICD-10-CM

## 2015-03-02 DIAGNOSIS — J069 Acute upper respiratory infection, unspecified: Secondary | ICD-10-CM | POA: Diagnosis not present

## 2015-03-15 DIAGNOSIS — Z1389 Encounter for screening for other disorder: Secondary | ICD-10-CM | POA: Diagnosis not present

## 2015-03-15 DIAGNOSIS — E039 Hypothyroidism, unspecified: Secondary | ICD-10-CM | POA: Diagnosis not present

## 2015-03-15 DIAGNOSIS — I10 Essential (primary) hypertension: Secondary | ICD-10-CM | POA: Diagnosis not present

## 2015-03-15 DIAGNOSIS — E119 Type 2 diabetes mellitus without complications: Secondary | ICD-10-CM | POA: Diagnosis not present

## 2015-03-15 DIAGNOSIS — Z0000 Encounter for general adult medical examination without abnormal findings: Secondary | ICD-10-CM | POA: Diagnosis not present

## 2015-03-17 ENCOUNTER — Ambulatory Visit
Admission: RE | Admit: 2015-03-17 | Discharge: 2015-03-17 | Disposition: A | Discharge status: Home / Self Care | Payer: Medicare HMO | Source: Ambulatory Visit

## 2015-03-17 DIAGNOSIS — Z9012 Acquired absence of left breast and nipple: Secondary | ICD-10-CM

## 2015-03-17 DIAGNOSIS — Z1231 Encounter for screening mammogram for malignant neoplasm of breast: Secondary | ICD-10-CM | POA: Diagnosis not present

## 2015-03-29 DIAGNOSIS — I10 Essential (primary) hypertension: Secondary | ICD-10-CM | POA: Diagnosis not present

## 2015-03-29 DIAGNOSIS — Z780 Asymptomatic menopausal state: Secondary | ICD-10-CM | POA: Diagnosis not present

## 2015-03-29 DIAGNOSIS — E782 Mixed hyperlipidemia: Secondary | ICD-10-CM | POA: Diagnosis not present

## 2015-03-29 DIAGNOSIS — E119 Type 2 diabetes mellitus without complications: Secondary | ICD-10-CM | POA: Diagnosis not present

## 2015-03-29 DIAGNOSIS — Z0000 Encounter for general adult medical examination without abnormal findings: Secondary | ICD-10-CM | POA: Diagnosis not present

## 2015-03-29 DIAGNOSIS — E039 Hypothyroidism, unspecified: Secondary | ICD-10-CM | POA: Diagnosis not present

## 2015-03-29 DIAGNOSIS — I2510 Atherosclerotic heart disease of native coronary artery without angina pectoris: Secondary | ICD-10-CM | POA: Diagnosis not present

## 2015-06-14 DIAGNOSIS — H353132 Nonexudative age-related macular degeneration, bilateral, intermediate dry stage: Secondary | ICD-10-CM | POA: Diagnosis not present

## 2015-06-14 DIAGNOSIS — Z794 Long term (current) use of insulin: Secondary | ICD-10-CM | POA: Diagnosis not present

## 2015-06-14 DIAGNOSIS — E119 Type 2 diabetes mellitus without complications: Secondary | ICD-10-CM | POA: Diagnosis not present

## 2015-06-14 DIAGNOSIS — H524 Presbyopia: Secondary | ICD-10-CM | POA: Diagnosis not present

## 2015-07-07 DIAGNOSIS — C50912 Malignant neoplasm of unspecified site of left female breast: Secondary | ICD-10-CM | POA: Diagnosis not present

## 2015-09-26 DIAGNOSIS — E782 Mixed hyperlipidemia: Secondary | ICD-10-CM | POA: Diagnosis not present

## 2015-09-26 DIAGNOSIS — E119 Type 2 diabetes mellitus without complications: Secondary | ICD-10-CM | POA: Diagnosis not present

## 2015-09-26 DIAGNOSIS — I10 Essential (primary) hypertension: Secondary | ICD-10-CM | POA: Diagnosis not present

## 2015-09-26 DIAGNOSIS — E039 Hypothyroidism, unspecified: Secondary | ICD-10-CM | POA: Diagnosis not present

## 2015-10-03 DIAGNOSIS — I2510 Atherosclerotic heart disease of native coronary artery without angina pectoris: Secondary | ICD-10-CM | POA: Diagnosis not present

## 2015-10-03 DIAGNOSIS — E119 Type 2 diabetes mellitus without complications: Secondary | ICD-10-CM | POA: Diagnosis not present

## 2015-10-03 DIAGNOSIS — I10 Essential (primary) hypertension: Secondary | ICD-10-CM | POA: Diagnosis not present

## 2015-10-03 DIAGNOSIS — E782 Mixed hyperlipidemia: Secondary | ICD-10-CM | POA: Diagnosis not present

## 2015-11-01 DIAGNOSIS — M109 Gout, unspecified: Secondary | ICD-10-CM | POA: Diagnosis not present

## 2015-12-31 ENCOUNTER — Encounter (HOSPITAL_BASED_OUTPATIENT_CLINIC_OR_DEPARTMENT_OTHER): Payer: Self-pay | Admitting: *Deleted

## 2015-12-31 ENCOUNTER — Emergency Department (HOSPITAL_BASED_OUTPATIENT_CLINIC_OR_DEPARTMENT_OTHER): Payer: Commercial Managed Care - HMO

## 2015-12-31 ENCOUNTER — Emergency Department (HOSPITAL_BASED_OUTPATIENT_CLINIC_OR_DEPARTMENT_OTHER)
Admission: EM | Admit: 2015-12-31 | Discharge: 2015-12-31 | Disposition: A | Discharge status: Home / Self Care | Payer: Commercial Managed Care - HMO | Attending: Emergency Medicine | Admitting: Emergency Medicine

## 2015-12-31 DIAGNOSIS — H539 Unspecified visual disturbance: Secondary | ICD-10-CM | POA: Diagnosis not present

## 2015-12-31 DIAGNOSIS — Z7982 Long term (current) use of aspirin: Secondary | ICD-10-CM | POA: Insufficient documentation

## 2015-12-31 DIAGNOSIS — Z87891 Personal history of nicotine dependence: Secondary | ICD-10-CM | POA: Insufficient documentation

## 2015-12-31 DIAGNOSIS — M542 Cervicalgia: Secondary | ICD-10-CM | POA: Diagnosis not present

## 2015-12-31 DIAGNOSIS — E119 Type 2 diabetes mellitus without complications: Secondary | ICD-10-CM | POA: Insufficient documentation

## 2015-12-31 DIAGNOSIS — I10 Essential (primary) hypertension: Secondary | ICD-10-CM | POA: Insufficient documentation

## 2015-12-31 DIAGNOSIS — Z7984 Long term (current) use of oral hypoglycemic drugs: Secondary | ICD-10-CM | POA: Insufficient documentation

## 2015-12-31 DIAGNOSIS — E039 Hypothyroidism, unspecified: Secondary | ICD-10-CM | POA: Insufficient documentation

## 2015-12-31 DIAGNOSIS — M47892 Other spondylosis, cervical region: Secondary | ICD-10-CM | POA: Insufficient documentation

## 2015-12-31 DIAGNOSIS — Z79899 Other long term (current) drug therapy: Secondary | ICD-10-CM | POA: Diagnosis not present

## 2015-12-31 DIAGNOSIS — I2510 Atherosclerotic heart disease of native coronary artery without angina pectoris: Secondary | ICD-10-CM | POA: Insufficient documentation

## 2015-12-31 DIAGNOSIS — M5030 Other cervical disc degeneration, unspecified cervical region: Secondary | ICD-10-CM | POA: Diagnosis not present

## 2015-12-31 DIAGNOSIS — M47812 Spondylosis without myelopathy or radiculopathy, cervical region: Secondary | ICD-10-CM

## 2015-12-31 MED ORDER — TRAMADOL HCL 50 MG PO TABS: 50.0000 mg | ORAL_TABLET | Freq: Four times a day (QID) | ORAL | Status: DC | PRN

## 2015-12-31 MED ORDER — DIAZEPAM 2 MG PO TABS
2.0000 mg | ORAL_TABLET | Freq: Once | ORAL | Status: AC
  Administered 2015-12-31: 2 mg via ORAL
  Filled 2015-12-31: qty 1

## 2015-12-31 NOTE — ED Provider Notes (Signed)
CSN: [id number]     Arrival date & time 12/31/15  1111 History   First MD Initiated Contact with Patient 12/31/15 1151     Chief Complaint  Patient presents with  . Neck Pain     (Consider location/radiation/quality/duration/timing/severity/associated sxs/prior Treatment) Patient is a 82 y.o. female presenting with neck pain. The history is provided by the patient.  Neck Pain Pain location:  L side and R side Pain severity:  Severe Onset quality:  Gradual Duration:  1 week Timing:  Intermittent (will go away when laying down) Progression:  Worsening Chronicity:  New Context: not fall, not jumping from heights, not lifting a heavy object, not MVA and not recent injury   Worsened by:  Twisting and bending Ineffective treatments:  Muscle relaxants Associated symptoms: no chest pain, no fever, no headaches, no numbness, no syncope, no visual change and no weakness   500mg  methocarbamol not helping  Past Medical History  Diagnosis Date  . Diabetes mellitus without complication ([place])   . Coronary artery disease     CABG - 3- 2001  . Hypertension   . Cancer ([place])     left breast  . Neuromuscular disorder ([place])     neuropathy in feet  and legs  . Hypothyroidism    Past Surgical History  Procedure Laterality Date  . Cabg    . Appendectomy    . Abdominal hysterectomy    . Back surgery    . Hip surgery       right hip replacement  . Joint replacement      left knee replacement  . Simple mastectomy with axillary sentinel node biopsy Left 06/06/2014    Procedure: LEFT SIMPLE MASTECTOMY;  Surgeon: Excell Seltzer, MD;  Location: [facility];  Service: General;  Laterality: Left;   No family history on file. Social History  Substance Use Topics  . Smoking status: Former Research scientist (life sciences)  . Smokeless tobacco: None     Comment: quit 40 years ago   . Alcohol Use: Yes     Comment: occasional - drinks wine   OB History    No data available     Review of Systems   Constitutional: Negative for fever.  HENT: Negative for sore throat.   Eyes: Positive for visual disturbance (vison has been blurred more over last week, no visual field cuts, no diplopia).  Respiratory: Negative for cough (occasional with postnasal drainage at night) and shortness of breath.   Cardiovascular: Negative for chest pain and syncope.  Gastrointestinal: Negative for nausea, vomiting and abdominal pain.  Genitourinary: Negative for difficulty urinating.  Musculoskeletal: Positive for neck pain. Negative for back pain.  Skin: Negative for rash.  Neurological: Negative for syncope, weakness, numbness and headaches.      Allergies  Review of patient's allergies indicates no known allergies.  Home Medications   Prior to Admission medications   Medication Sig Start Date End Date Taking? Authorizing Provider  ADVOCATE REDI-CODE test strip  03/09/14   Historical Provider, MD  amLODipine (NORVASC) 5 MG tablet 5 mg daily.  01/04/14   Historical Provider, MD  aspirin 81 MG tablet Take 81 mg by mouth daily.    Historical Provider, MD  atenolol (TENORMIN) 50 MG tablet 50 mg 2 (two) times daily.  01/04/14   Historical Provider, MD  Calcium Carbonate-Vit D-Min (GNP CALCIUM PLUS 600 +D PO) Take 1 tablet by mouth.    Historical Provider, MD  glimepiride (AMARYL) 1 MG tablet 1 mg daily with  breakfast.  02/03/14   Historical Provider, MD  HYDROcodone-acetaminophen (NORCO/VICODIN) 5-325 MG per tablet Take 1-2 tablets by mouth every 4 (four) hours as needed for moderate pain. 06/06/14   Excell Seltzer, MD  levothyroxine (SYNTHROID, LEVOTHROID) 88 MCG tablet 88 mcg daily.  01/04/14   Historical Provider, MD  lisinopril (PRINIVIL,ZESTRIL) 5 MG tablet 5 mg daily.  01/04/14   Historical Provider, MD  Multiple Vitamin (MULTIVITAMIN) tablet Take 1 tablet by mouth daily.    Historical Provider, MD  naproxen sodium (ALEVE) 220 MG tablet Take 2 tablets every 12 hours until gout flare resolves. 02/22/13   John  Molpus, MD  Omega-3 Fatty Acids (FISH OIL PO) Take 1 tablet by mouth 2 (two) times daily.    Historical Provider, MD  traMADol (ULTRAM) 50 MG tablet Take 1 tablet (50 mg total) by mouth every 6 (six) hours as needed. 12/31/15   Gareth Morgan, MD  triamterene-hydrochlorothiazide (MAXZIDE-25) 37.5-25 MG per tablet daily.  01/04/14   Historical Provider, MD  UNABLE TO FIND Med Name: Left Mastectomy Supplies  Diagnosis:  C50.912, Z90.12 07/07/14   Excell Seltzer, MD  UNKNOWN TO PATIENT     Historical Provider, MD   BP 109/49 mmHg  Pulse 68  Temp(Src) 99.1 F (37.3 C) (Oral)  Resp 22  Ht 4\' 9"  (1.448 m)  Wt 137 lb (62.143 kg)  BMI 29.64 kg/m2  SpO2 96% Physical Exam  Constitutional: She is oriented to person, place, and time. She appears well-developed and well-nourished. No distress.  HENT:  Head: Normocephalic and atraumatic.  Mouth/Throat: No oropharyngeal exudate.  Eyes: Conjunctivae and EOM are normal. Pupils are equal, round, and reactive to light.  Neck: Normal range of motion.  Cardiovascular: Normal rate, regular rhythm, normal heart sounds and intact distal pulses.  Exam reveals no gallop and no friction rub.   No murmur heard. Pulmonary/Chest: Effort normal and breath sounds normal. No respiratory distress. She has no wheezes. She has no rales.  Abdominal: Soft. She exhibits no distension. There is no tenderness. There is no guarding.  Musculoskeletal: She exhibits no edema.       Cervical back: She exhibits tenderness (no midline). She exhibits no bony tenderness, no swelling, no edema, no deformity, no laceration and no pain.  Neurological: She is alert and oriented to person, place, and time. She has normal strength. She displays no tremor. No cranial nerve deficit or sensory deficit. Coordination normal. GCS eye subscore is 4. GCS verbal subscore is 5. GCS motor subscore is 6.  Excellent bilateral strength  Skin: Skin is warm and dry. No rash noted. She is not diaphoretic.  No erythema.  Nursing note and vitals reviewed.    ED Course  Procedures (including critical care time) Labs Review Labs Reviewed - No data to display  Imaging Review Ct Cervical Spine Wo Contrast  12/31/2015  CLINICAL DATA:  Posterior neck pain radiating into left shoulder, no known injury EXAM: CT CERVICAL SPINE WITHOUT CONTRAST TECHNIQUE: Multidetector CT imaging of the cervical spine was performed without intravenous contrast. Multiplanar CT image reconstructions were also generated. COMPARISON:  None. FINDINGS: No acute soft tissue abnormalities. Visualized portions of the lung apices clear. Normal anterior-posterior alignment. No prevertebral soft tissue swelling. No fracture. There is moderate degenerative disc disease at C3-4, C4-5, C5-6, and C6-7. Mild C7-T1 and C2-3 degenerative disc disease. Posterior elements on the left at C2 and C3 are fused. There is degenerative facet change throughout the cervical spine. Bilateral carotid artery calcification. IMPRESSION: No acute  findings.  Significant degenerative change. Electronically Signed   By: Skipper Cliche M.D.   On: 12/31/2015 14:06   I have personally reviewed and evaluated these images and lab results as part of my medical decision-making.   EKG Interpretation None      MDM   Final diagnoses:  Neck pain  Neck pain, musculoskeletal  Degenerative arthritis of cervical spine   82 year old female with a history of diabetes, coronary artery disease, hypertension, breast cancer, hypothyroidism presents with concern for bilateral neck pain. CT of the cervical spine was done which showed degenerative changes without signs of compression fracture. Pain is only present with head movements, suggesting likely musculoskeletal etiology of symptoms. Have low suspicion for other intrathoracic cause of pain/ACS. Given bilateral and positional pain, doubt carotid or vertebral dissection. Doubt retropharyngeal abscess given duration of  symptoms, bilateral nature, pt afebrile, well appearing.  Discussed thought process and results with patient and daughter in detail. Suspect likely musculoskeletal pain related to muscular strain or degenerative changes.  Given tramadol rx, discussed having daughter stay with patient to monitor effects and fall risk. Recommend heat/ice.  Patient discharged in stable condition with understanding of reasons to return.    Gareth Morgan, MD 12/31/15 1721

## 2015-12-31 NOTE — ED Notes (Addendum)
Pt reports only taking muscle relaxants for one day only. Used heating pad one night. Able to tilt head forward. Reports no n/v/d, or fevers. Pt has been playing solitaire frequently at small table before neck pain started.

## 2015-12-31 NOTE — ED Notes (Signed)
Patient c/o neck pain that started last Monday, Her MD prescribed a muscle relaxer yesterday, but it has not helped

## 2016-01-19 DIAGNOSIS — C50912 Malignant neoplasm of unspecified site of left female breast: Secondary | ICD-10-CM | POA: Diagnosis not present

## 2016-03-11 ENCOUNTER — Other Ambulatory Visit: Payer: Self-pay | Admitting: Internal Medicine

## 2016-03-11 DIAGNOSIS — Z1231 Encounter for screening mammogram for malignant neoplasm of breast: Secondary | ICD-10-CM

## 2016-04-08 ENCOUNTER — Ambulatory Visit
Admission: RE | Admit: 2016-04-08 | Discharge: 2016-04-08 | Disposition: A | Discharge status: Home / Self Care | Payer: Commercial Managed Care - HMO | Source: Ambulatory Visit | Attending: Internal Medicine | Admitting: Internal Medicine

## 2016-04-08 DIAGNOSIS — Z1231 Encounter for screening mammogram for malignant neoplasm of breast: Secondary | ICD-10-CM | POA: Diagnosis not present

## 2016-04-23 DIAGNOSIS — I2510 Atherosclerotic heart disease of native coronary artery without angina pectoris: Secondary | ICD-10-CM | POA: Diagnosis not present

## 2016-04-23 DIAGNOSIS — M109 Gout, unspecified: Secondary | ICD-10-CM | POA: Diagnosis not present

## 2016-04-23 DIAGNOSIS — E119 Type 2 diabetes mellitus without complications: Secondary | ICD-10-CM | POA: Diagnosis not present

## 2016-04-23 DIAGNOSIS — E039 Hypothyroidism, unspecified: Secondary | ICD-10-CM | POA: Diagnosis not present

## 2016-04-23 DIAGNOSIS — E782 Mixed hyperlipidemia: Secondary | ICD-10-CM | POA: Diagnosis not present

## 2016-04-23 DIAGNOSIS — I10 Essential (primary) hypertension: Secondary | ICD-10-CM | POA: Diagnosis not present

## 2016-04-30 DIAGNOSIS — Z23 Encounter for immunization: Secondary | ICD-10-CM | POA: Diagnosis not present

## 2016-04-30 DIAGNOSIS — E119 Type 2 diabetes mellitus without complications: Secondary | ICD-10-CM | POA: Diagnosis not present

## 2016-04-30 DIAGNOSIS — E039 Hypothyroidism, unspecified: Secondary | ICD-10-CM | POA: Diagnosis not present

## 2016-04-30 DIAGNOSIS — E782 Mixed hyperlipidemia: Secondary | ICD-10-CM | POA: Diagnosis not present

## 2016-04-30 DIAGNOSIS — D72829 Elevated white blood cell count, unspecified: Secondary | ICD-10-CM | POA: Diagnosis not present

## 2016-07-16 DIAGNOSIS — H524 Presbyopia: Secondary | ICD-10-CM | POA: Diagnosis not present

## 2016-07-16 DIAGNOSIS — H04123 Dry eye syndrome of bilateral lacrimal glands: Secondary | ICD-10-CM | POA: Diagnosis not present

## 2016-07-16 DIAGNOSIS — E119 Type 2 diabetes mellitus without complications: Secondary | ICD-10-CM | POA: Diagnosis not present

## 2016-07-16 DIAGNOSIS — H1820 Unspecified corneal edema: Secondary | ICD-10-CM | POA: Diagnosis not present

## 2016-07-16 DIAGNOSIS — H353132 Nonexudative age-related macular degeneration, bilateral, intermediate dry stage: Secondary | ICD-10-CM | POA: Diagnosis not present

## 2016-09-03 DIAGNOSIS — D72829 Elevated white blood cell count, unspecified: Secondary | ICD-10-CM | POA: Diagnosis not present

## 2016-09-03 DIAGNOSIS — M1A09X1 Idiopathic chronic gout, multiple sites, with tophus (tophi): Secondary | ICD-10-CM | POA: Diagnosis not present

## 2016-09-03 DIAGNOSIS — E119 Type 2 diabetes mellitus without complications: Secondary | ICD-10-CM | POA: Diagnosis not present

## 2016-09-10 DIAGNOSIS — I2510 Atherosclerotic heart disease of native coronary artery without angina pectoris: Secondary | ICD-10-CM | POA: Diagnosis not present

## 2016-09-10 DIAGNOSIS — E119 Type 2 diabetes mellitus without complications: Secondary | ICD-10-CM | POA: Diagnosis not present

## 2016-09-10 DIAGNOSIS — E782 Mixed hyperlipidemia: Secondary | ICD-10-CM | POA: Diagnosis not present

## 2016-09-10 DIAGNOSIS — M1A09X1 Idiopathic chronic gout, multiple sites, with tophus (tophi): Secondary | ICD-10-CM | POA: Diagnosis not present

## 2016-09-30 ENCOUNTER — Other Ambulatory Visit: Payer: Self-pay

## 2016-09-30 DIAGNOSIS — L219 Seborrheic dermatitis, unspecified: Secondary | ICD-10-CM | POA: Diagnosis not present

## 2016-09-30 DIAGNOSIS — C4442 Squamous cell carcinoma of skin of scalp and neck: Secondary | ICD-10-CM | POA: Diagnosis not present

## 2016-09-30 DIAGNOSIS — L570 Actinic keratosis: Secondary | ICD-10-CM | POA: Diagnosis not present

## 2016-09-30 DIAGNOSIS — D044 Carcinoma in situ of skin of scalp and neck: Secondary | ICD-10-CM | POA: Diagnosis not present

## 2016-10-31 DIAGNOSIS — C4442 Squamous cell carcinoma of skin of scalp and neck: Secondary | ICD-10-CM | POA: Diagnosis not present

## 2016-10-31 DIAGNOSIS — D044 Carcinoma in situ of skin of scalp and neck: Secondary | ICD-10-CM | POA: Diagnosis not present

## 2016-11-20 DIAGNOSIS — C50912 Malignant neoplasm of unspecified site of left female breast: Secondary | ICD-10-CM | POA: Diagnosis not present

## 2017-02-10 DIAGNOSIS — L570 Actinic keratosis: Secondary | ICD-10-CM | POA: Diagnosis not present

## 2017-03-03 ENCOUNTER — Other Ambulatory Visit: Payer: Self-pay | Admitting: Internal Medicine

## 2017-03-03 DIAGNOSIS — Z853 Personal history of malignant neoplasm of breast: Secondary | ICD-10-CM

## 2017-03-24 DIAGNOSIS — L570 Actinic keratosis: Secondary | ICD-10-CM | POA: Diagnosis not present

## 2017-04-07 DIAGNOSIS — E119 Type 2 diabetes mellitus without complications: Secondary | ICD-10-CM | POA: Diagnosis not present

## 2017-04-07 DIAGNOSIS — Z780 Asymptomatic menopausal state: Secondary | ICD-10-CM | POA: Diagnosis not present

## 2017-04-07 DIAGNOSIS — M1A09X1 Idiopathic chronic gout, multiple sites, with tophus (tophi): Secondary | ICD-10-CM | POA: Diagnosis not present

## 2017-04-07 DIAGNOSIS — N390 Urinary tract infection, site not specified: Secondary | ICD-10-CM | POA: Diagnosis not present

## 2017-04-07 DIAGNOSIS — E782 Mixed hyperlipidemia: Secondary | ICD-10-CM | POA: Diagnosis not present

## 2017-04-07 DIAGNOSIS — Z0000 Encounter for general adult medical examination without abnormal findings: Secondary | ICD-10-CM | POA: Diagnosis not present

## 2017-04-21 DIAGNOSIS — Z23 Encounter for immunization: Secondary | ICD-10-CM | POA: Diagnosis not present

## 2017-04-21 DIAGNOSIS — I2510 Atherosclerotic heart disease of native coronary artery without angina pectoris: Secondary | ICD-10-CM | POA: Diagnosis not present

## 2017-04-21 DIAGNOSIS — E119 Type 2 diabetes mellitus without complications: Secondary | ICD-10-CM | POA: Diagnosis not present

## 2017-04-21 DIAGNOSIS — R002 Palpitations: Secondary | ICD-10-CM | POA: Diagnosis not present

## 2017-04-21 DIAGNOSIS — I129 Hypertensive chronic kidney disease with stage 1 through stage 4 chronic kidney disease, or unspecified chronic kidney disease: Secondary | ICD-10-CM | POA: Diagnosis not present

## 2017-04-21 DIAGNOSIS — E782 Mixed hyperlipidemia: Secondary | ICD-10-CM | POA: Diagnosis not present

## 2017-04-28 ENCOUNTER — Ambulatory Visit
Admission: RE | Admit: 2017-04-28 | Discharge: 2017-04-28 | Disposition: A | Discharge status: Home / Self Care | Payer: Medicare HMO | Source: Ambulatory Visit | Attending: Internal Medicine | Admitting: Internal Medicine

## 2017-04-28 DIAGNOSIS — Z853 Personal history of malignant neoplasm of breast: Secondary | ICD-10-CM

## 2017-04-28 DIAGNOSIS — Z1231 Encounter for screening mammogram for malignant neoplasm of breast: Secondary | ICD-10-CM | POA: Diagnosis not present

## 2017-05-01 ENCOUNTER — Other Ambulatory Visit: Payer: Self-pay

## 2017-05-01 DIAGNOSIS — C4442 Squamous cell carcinoma of skin of scalp and neck: Secondary | ICD-10-CM | POA: Diagnosis not present

## 2017-06-30 DIAGNOSIS — C4442 Squamous cell carcinoma of skin of scalp and neck: Secondary | ICD-10-CM | POA: Diagnosis not present

## 2017-07-03 DIAGNOSIS — C50912 Malignant neoplasm of unspecified site of left female breast: Secondary | ICD-10-CM | POA: Diagnosis not present

## 2017-07-16 DIAGNOSIS — E119 Type 2 diabetes mellitus without complications: Secondary | ICD-10-CM | POA: Diagnosis not present

## 2017-07-16 DIAGNOSIS — H1820 Unspecified corneal edema: Secondary | ICD-10-CM | POA: Diagnosis not present

## 2017-07-16 DIAGNOSIS — H353132 Nonexudative age-related macular degeneration, bilateral, intermediate dry stage: Secondary | ICD-10-CM | POA: Diagnosis not present

## 2017-07-16 DIAGNOSIS — H524 Presbyopia: Secondary | ICD-10-CM | POA: Diagnosis not present

## 2017-07-16 DIAGNOSIS — H04123 Dry eye syndrome of bilateral lacrimal glands: Secondary | ICD-10-CM | POA: Diagnosis not present

## 2017-11-17 DIAGNOSIS — I129 Hypertensive chronic kidney disease with stage 1 through stage 4 chronic kidney disease, or unspecified chronic kidney disease: Secondary | ICD-10-CM | POA: Diagnosis not present

## 2017-11-17 DIAGNOSIS — E119 Type 2 diabetes mellitus without complications: Secondary | ICD-10-CM | POA: Diagnosis not present

## 2017-11-17 DIAGNOSIS — E782 Mixed hyperlipidemia: Secondary | ICD-10-CM | POA: Diagnosis not present

## 2017-11-24 DIAGNOSIS — E039 Hypothyroidism, unspecified: Secondary | ICD-10-CM | POA: Diagnosis not present

## 2017-11-24 DIAGNOSIS — E119 Type 2 diabetes mellitus without complications: Secondary | ICD-10-CM | POA: Diagnosis not present

## 2017-11-24 DIAGNOSIS — I2510 Atherosclerotic heart disease of native coronary artery without angina pectoris: Secondary | ICD-10-CM | POA: Diagnosis not present

## 2017-11-24 DIAGNOSIS — G609 Hereditary and idiopathic neuropathy, unspecified: Secondary | ICD-10-CM | POA: Diagnosis not present

## 2017-11-24 DIAGNOSIS — N183 Chronic kidney disease, stage 3 (moderate): Secondary | ICD-10-CM | POA: Diagnosis not present

## 2017-11-24 DIAGNOSIS — R7989 Other specified abnormal findings of blood chemistry: Secondary | ICD-10-CM | POA: Diagnosis not present

## 2017-11-24 DIAGNOSIS — E782 Mixed hyperlipidemia: Secondary | ICD-10-CM | POA: Diagnosis not present

## 2017-11-26 ENCOUNTER — Encounter (HOSPITAL_COMMUNITY): Payer: Self-pay | Admitting: Emergency Medicine

## 2017-11-26 ENCOUNTER — Emergency Department (HOSPITAL_COMMUNITY): Payer: Medicare HMO

## 2017-11-26 ENCOUNTER — Inpatient Hospital Stay (HOSPITAL_COMMUNITY)
Admission: EM | Admit: 2017-11-26 | Discharge: 2017-12-03 | DRG: 982 | Disposition: A | Discharge status: Rehabilitation Facility | Payer: Medicare HMO | Attending: Surgery | Admitting: Surgery

## 2017-11-26 DIAGNOSIS — I25810 Atherosclerosis of coronary artery bypass graft(s) without angina pectoris: Secondary | ICD-10-CM

## 2017-11-26 DIAGNOSIS — M25512 Pain in left shoulder: Secondary | ICD-10-CM | POA: Diagnosis present

## 2017-11-26 DIAGNOSIS — Z951 Presence of aortocoronary bypass graft: Secondary | ICD-10-CM

## 2017-11-26 DIAGNOSIS — M79642 Pain in left hand: Secondary | ICD-10-CM | POA: Diagnosis not present

## 2017-11-26 DIAGNOSIS — Z96641 Presence of right artificial hip joint: Secondary | ICD-10-CM | POA: Diagnosis present

## 2017-11-26 DIAGNOSIS — R339 Retention of urine, unspecified: Secondary | ICD-10-CM

## 2017-11-26 DIAGNOSIS — I609 Nontraumatic subarachnoid hemorrhage, unspecified: Secondary | ICD-10-CM | POA: Diagnosis not present

## 2017-11-26 DIAGNOSIS — S12301D Unspecified nondisplaced fracture of fourth cervical vertebra, subsequent encounter for fracture with routine healing: Secondary | ICD-10-CM | POA: Diagnosis not present

## 2017-11-26 DIAGNOSIS — E222 Syndrome of inappropriate secretion of antidiuretic hormone: Secondary | ICD-10-CM | POA: Diagnosis not present

## 2017-11-26 DIAGNOSIS — R0902 Hypoxemia: Secondary | ICD-10-CM

## 2017-11-26 DIAGNOSIS — R7309 Other abnormal glucose: Secondary | ICD-10-CM | POA: Diagnosis not present

## 2017-11-26 DIAGNOSIS — N183 Chronic kidney disease, stage 3 unspecified: Secondary | ICD-10-CM

## 2017-11-26 DIAGNOSIS — Z23 Encounter for immunization: Secondary | ICD-10-CM

## 2017-11-26 DIAGNOSIS — M25511 Pain in right shoulder: Secondary | ICD-10-CM | POA: Diagnosis present

## 2017-11-26 DIAGNOSIS — T07XXXA Unspecified multiple injuries, initial encounter: Secondary | ICD-10-CM | POA: Diagnosis not present

## 2017-11-26 DIAGNOSIS — R1312 Dysphagia, oropharyngeal phase: Secondary | ICD-10-CM

## 2017-11-26 DIAGNOSIS — E119 Type 2 diabetes mellitus without complications: Secondary | ICD-10-CM

## 2017-11-26 DIAGNOSIS — S12401D Unspecified nondisplaced fracture of fifth cervical vertebra, subsequent encounter for fracture with routine healing: Secondary | ICD-10-CM | POA: Diagnosis not present

## 2017-11-26 DIAGNOSIS — S01512A Laceration without foreign body of oral cavity, initial encounter: Secondary | ICD-10-CM | POA: Diagnosis present

## 2017-11-26 DIAGNOSIS — S12400A Unspecified displaced fracture of fifth cervical vertebra, initial encounter for closed fracture: Secondary | ICD-10-CM | POA: Diagnosis not present

## 2017-11-26 DIAGNOSIS — S0292XB Unspecified fracture of facial bones, initial encounter for open fracture: Secondary | ICD-10-CM | POA: Diagnosis not present

## 2017-11-26 DIAGNOSIS — Z90710 Acquired absence of both cervix and uterus: Secondary | ICD-10-CM

## 2017-11-26 DIAGNOSIS — S6992XA Unspecified injury of left wrist, hand and finger(s), initial encounter: Secondary | ICD-10-CM | POA: Diagnosis not present

## 2017-11-26 DIAGNOSIS — M25531 Pain in right wrist: Secondary | ICD-10-CM | POA: Diagnosis not present

## 2017-11-26 DIAGNOSIS — E039 Hypothyroidism, unspecified: Secondary | ICD-10-CM | POA: Diagnosis not present

## 2017-11-26 DIAGNOSIS — I6200 Nontraumatic subdural hemorrhage, unspecified: Secondary | ICD-10-CM | POA: Diagnosis not present

## 2017-11-26 DIAGNOSIS — S022XXA Fracture of nasal bones, initial encounter for closed fracture: Secondary | ICD-10-CM | POA: Diagnosis not present

## 2017-11-26 DIAGNOSIS — W19XXXA Unspecified fall, initial encounter: Secondary | ICD-10-CM | POA: Diagnosis not present

## 2017-11-26 DIAGNOSIS — M25532 Pain in left wrist: Secondary | ICD-10-CM | POA: Diagnosis present

## 2017-11-26 DIAGNOSIS — E1142 Type 2 diabetes mellitus with diabetic polyneuropathy: Secondary | ICD-10-CM | POA: Diagnosis not present

## 2017-11-26 DIAGNOSIS — Z87891 Personal history of nicotine dependence: Secondary | ICD-10-CM | POA: Diagnosis not present

## 2017-11-26 DIAGNOSIS — S062X0A Diffuse traumatic brain injury without loss of consciousness, initial encounter: Secondary | ICD-10-CM | POA: Diagnosis present

## 2017-11-26 DIAGNOSIS — S01511S Laceration without foreign body of lip, sequela: Secondary | ICD-10-CM | POA: Diagnosis not present

## 2017-11-26 DIAGNOSIS — N179 Acute kidney failure, unspecified: Secondary | ICD-10-CM | POA: Diagnosis not present

## 2017-11-26 DIAGNOSIS — S4992XA Unspecified injury of left shoulder and upper arm, initial encounter: Secondary | ICD-10-CM | POA: Diagnosis not present

## 2017-11-26 DIAGNOSIS — R918 Other nonspecific abnormal finding of lung field: Secondary | ICD-10-CM | POA: Diagnosis not present

## 2017-11-26 DIAGNOSIS — Z853 Personal history of malignant neoplasm of breast: Secondary | ICD-10-CM

## 2017-11-26 DIAGNOSIS — D62 Acute posthemorrhagic anemia: Secondary | ICD-10-CM | POA: Diagnosis not present

## 2017-11-26 DIAGNOSIS — S0240CB Maxillary fracture, right side, initial encounter for open fracture: Secondary | ICD-10-CM | POA: Diagnosis not present

## 2017-11-26 DIAGNOSIS — R1310 Dysphagia, unspecified: Secondary | ICD-10-CM | POA: Diagnosis not present

## 2017-11-26 DIAGNOSIS — Z96652 Presence of left artificial knee joint: Secondary | ICD-10-CM | POA: Diagnosis present

## 2017-11-26 DIAGNOSIS — I10 Essential (primary) hypertension: Secondary | ICD-10-CM | POA: Diagnosis present

## 2017-11-26 DIAGNOSIS — Z7982 Long term (current) use of aspirin: Secondary | ICD-10-CM | POA: Diagnosis not present

## 2017-11-26 DIAGNOSIS — S12301A Unspecified nondisplaced fracture of fourth cervical vertebra, initial encounter for closed fracture: Secondary | ICD-10-CM | POA: Diagnosis not present

## 2017-11-26 DIAGNOSIS — S0242 Fracture of alveolus of maxilla: Secondary | ICD-10-CM | POA: Diagnosis not present

## 2017-11-26 DIAGNOSIS — S06301S Unspecified focal traumatic brain injury with loss of consciousness of 30 minutes or less, sequela: Secondary | ICD-10-CM | POA: Diagnosis not present

## 2017-11-26 DIAGNOSIS — S01511A Laceration without foreign body of lip, initial encounter: Secondary | ICD-10-CM | POA: Diagnosis not present

## 2017-11-26 DIAGNOSIS — S022XXD Fracture of nasal bones, subsequent encounter for fracture with routine healing: Secondary | ICD-10-CM | POA: Diagnosis not present

## 2017-11-26 DIAGNOSIS — S0242XA Fracture of alveolus of maxilla, initial encounter for closed fracture: Secondary | ICD-10-CM | POA: Diagnosis not present

## 2017-11-26 DIAGNOSIS — S06301D Unspecified focal traumatic brain injury with loss of consciousness of 30 minutes or less, subsequent encounter: Secondary | ICD-10-CM | POA: Diagnosis not present

## 2017-11-26 DIAGNOSIS — S06899A Other specified intracranial injury with loss of consciousness of unspecified duration, initial encounter: Secondary | ICD-10-CM | POA: Diagnosis not present

## 2017-11-26 DIAGNOSIS — W19XXXD Unspecified fall, subsequent encounter: Secondary | ICD-10-CM | POA: Diagnosis not present

## 2017-11-26 DIAGNOSIS — M7989 Other specified soft tissue disorders: Secondary | ICD-10-CM | POA: Diagnosis not present

## 2017-11-26 DIAGNOSIS — S066X9D Traumatic subarachnoid hemorrhage with loss of consciousness of unspecified duration, subsequent encounter: Secondary | ICD-10-CM | POA: Diagnosis not present

## 2017-11-26 DIAGNOSIS — Z9012 Acquired absence of left breast and nipple: Secondary | ICD-10-CM

## 2017-11-26 DIAGNOSIS — E1140 Type 2 diabetes mellitus with diabetic neuropathy, unspecified: Secondary | ICD-10-CM | POA: Diagnosis not present

## 2017-11-26 DIAGNOSIS — M10071 Idiopathic gout, right ankle and foot: Secondary | ICD-10-CM

## 2017-11-26 DIAGNOSIS — S12401A Unspecified nondisplaced fracture of fifth cervical vertebra, initial encounter for closed fracture: Secondary | ICD-10-CM | POA: Diagnosis not present

## 2017-11-26 DIAGNOSIS — S0083XA Contusion of other part of head, initial encounter: Secondary | ICD-10-CM | POA: Diagnosis not present

## 2017-11-26 DIAGNOSIS — S0993XA Unspecified injury of face, initial encounter: Secondary | ICD-10-CM

## 2017-11-26 DIAGNOSIS — S066X0A Traumatic subarachnoid hemorrhage without loss of consciousness, initial encounter: Secondary | ICD-10-CM | POA: Diagnosis not present

## 2017-11-26 DIAGNOSIS — S199XXA Unspecified injury of neck, initial encounter: Secondary | ICD-10-CM | POA: Diagnosis not present

## 2017-11-26 DIAGNOSIS — S6991XA Unspecified injury of right wrist, hand and finger(s), initial encounter: Secondary | ICD-10-CM | POA: Diagnosis not present

## 2017-11-26 DIAGNOSIS — Z79890 Hormone replacement therapy (postmenopausal): Secondary | ICD-10-CM | POA: Diagnosis not present

## 2017-11-26 DIAGNOSIS — R52 Pain, unspecified: Secondary | ICD-10-CM

## 2017-11-26 DIAGNOSIS — M5002 Cervical disc disorder with myelopathy, mid-cervical region, unspecified level: Secondary | ICD-10-CM | POA: Diagnosis not present

## 2017-11-26 DIAGNOSIS — E871 Hypo-osmolality and hyponatremia: Secondary | ICD-10-CM | POA: Diagnosis not present

## 2017-11-26 DIAGNOSIS — S129XXA Fracture of neck, unspecified, initial encounter: Secondary | ICD-10-CM

## 2017-11-26 DIAGNOSIS — R509 Fever, unspecified: Secondary | ICD-10-CM

## 2017-11-26 DIAGNOSIS — G8929 Other chronic pain: Secondary | ICD-10-CM

## 2017-11-26 DIAGNOSIS — D72829 Elevated white blood cell count, unspecified: Secondary | ICD-10-CM

## 2017-11-26 DIAGNOSIS — I2510 Atherosclerotic heart disease of native coronary artery without angina pectoris: Secondary | ICD-10-CM | POA: Diagnosis not present

## 2017-11-26 DIAGNOSIS — E669 Obesity, unspecified: Secondary | ICD-10-CM | POA: Diagnosis not present

## 2017-11-26 DIAGNOSIS — S098XXA Other specified injuries of head, initial encounter: Secondary | ICD-10-CM | POA: Diagnosis not present

## 2017-11-26 DIAGNOSIS — S02401A Maxillary fracture, unspecified, initial encounter for closed fracture: Secondary | ICD-10-CM

## 2017-11-26 DIAGNOSIS — M109 Gout, unspecified: Secondary | ICD-10-CM | POA: Diagnosis present

## 2017-11-26 DIAGNOSIS — E1169 Type 2 diabetes mellitus with other specified complication: Secondary | ICD-10-CM | POA: Diagnosis not present

## 2017-11-26 DIAGNOSIS — M79641 Pain in right hand: Secondary | ICD-10-CM | POA: Diagnosis present

## 2017-11-26 DIAGNOSIS — Z8739 Personal history of other diseases of the musculoskeletal system and connective tissue: Secondary | ICD-10-CM

## 2017-11-26 DIAGNOSIS — S0292XA Unspecified fracture of facial bones, initial encounter for closed fracture: Secondary | ICD-10-CM | POA: Diagnosis not present

## 2017-11-26 DIAGNOSIS — H5462 Unqualified visual loss, left eye, normal vision right eye: Secondary | ICD-10-CM | POA: Diagnosis present

## 2017-11-26 DIAGNOSIS — S4991XA Unspecified injury of right shoulder and upper arm, initial encounter: Secondary | ICD-10-CM | POA: Diagnosis not present

## 2017-11-26 DIAGNOSIS — S0990XA Unspecified injury of head, initial encounter: Secondary | ICD-10-CM | POA: Diagnosis not present

## 2017-11-26 DIAGNOSIS — I6521 Occlusion and stenosis of right carotid artery: Secondary | ICD-10-CM | POA: Diagnosis not present

## 2017-11-26 DIAGNOSIS — N189 Chronic kidney disease, unspecified: Secondary | ICD-10-CM | POA: Diagnosis not present

## 2017-11-26 DIAGNOSIS — Z7409 Other reduced mobility: Secondary | ICD-10-CM | POA: Diagnosis not present

## 2017-11-26 LAB — CBC WITH DIFFERENTIAL/PLATELET
Basophils Absolute: 0 10*3/uL (ref 0.0–0.1)
Basophils Relative: 0 %
Eosinophils Absolute: 0 10*3/uL (ref 0.0–0.7)
Eosinophils Relative: 0 %
HCT: 38.7 % (ref 36.0–46.0)
Hemoglobin: 13.2 g/dL (ref 12.0–15.0)
Lymphocytes Relative: 9 %
Lymphs Abs: 1.6 10*3/uL (ref 0.7–4.0)
MCH: 31.7 pg (ref 26.0–34.0)
MCHC: 34.1 g/dL (ref 30.0–36.0)
MCV: 93 fL (ref 78.0–100.0)
Monocytes Absolute: 1.3 10*3/uL — ABNORMAL HIGH (ref 0.1–1.0)
Monocytes Relative: 7 %
Neutro Abs: 15.1 10*3/uL — ABNORMAL HIGH (ref 1.7–7.7)
Neutrophils Relative %: 84 %
Platelets: 235 10*3/uL (ref 150–400)
RBC: 4.16 MIL/uL (ref 3.87–5.11)
RDW: 13 % (ref 11.5–15.5)
WBC: 18 10*3/uL — ABNORMAL HIGH (ref 4.0–10.5)

## 2017-11-26 LAB — URINALYSIS, ROUTINE W REFLEX MICROSCOPIC
Bilirubin Urine: NEGATIVE
Glucose, UA: NEGATIVE mg/dL
Ketones, ur: NEGATIVE mg/dL
Nitrite: NEGATIVE
Protein, ur: NEGATIVE mg/dL
Specific Gravity, Urine: 1.014 (ref 1.005–1.030)
pH: 6 (ref 5.0–8.0)

## 2017-11-26 LAB — CBG MONITORING, ED: Glucose-Capillary: 145 mg/dL — ABNORMAL HIGH (ref 65–99)

## 2017-11-26 LAB — PROTIME-INR
INR: 1.06
Prothrombin Time: 13.7 seconds (ref 11.4–15.2)

## 2017-11-26 LAB — COMPREHENSIVE METABOLIC PANEL
ALT: 32 U/L (ref 14–54)
AST: 55 U/L — ABNORMAL HIGH (ref 15–41)
Albumin: 2.9 g/dL — ABNORMAL LOW (ref 3.5–5.0)
Alkaline Phosphatase: 66 U/L (ref 38–126)
Anion gap: 12 (ref 5–15)
BUN: 32 mg/dL — ABNORMAL HIGH (ref 6–20)
CO2: 23 mmol/L (ref 22–32)
Calcium: 9.1 mg/dL (ref 8.9–10.3)
Chloride: 92 mmol/L — ABNORMAL LOW (ref 101–111)
Creatinine, Ser: 1.21 mg/dL — ABNORMAL HIGH (ref 0.44–1.00)
GFR calc Af Amer: 44 mL/min — ABNORMAL LOW (ref >60)
GFR calc non Af Amer: 38 mL/min — ABNORMAL LOW (ref >60)
Glucose, Bld: 161 mg/dL — ABNORMAL HIGH (ref 65–99)
Potassium: 3.7 mmol/L (ref 3.5–5.1)
Sodium: 127 mmol/L — ABNORMAL LOW (ref 135–145)
Total Bilirubin: 0.8 mg/dL (ref 0.3–1.2)
Total Protein: 6 g/dL — ABNORMAL LOW (ref 6.5–8.1)

## 2017-11-26 MED ORDER — ONDANSETRON 4 MG PO TBDP: 4.0000 mg | ORAL_TABLET | Freq: Four times a day (QID) | ORAL | Status: DC | PRN

## 2017-11-26 MED ORDER — MORPHINE SULFATE (PF) 4 MG/ML IV SOLN
1.0000 mg | INTRAVENOUS | Status: DC | PRN
  Administered 2017-11-26 – 2017-11-28 (×4): 1 mg via INTRAVENOUS
  Filled 2017-11-26 (×3): qty 1

## 2017-11-26 MED ORDER — TETANUS-DIPHTH-ACELL PERTUSSIS 5-2.5-18.5 LF-MCG/0.5 IM SUSP
0.5000 mL | Freq: Once | INTRAMUSCULAR | Status: AC
  Administered 2017-11-26: 0.5 mL via INTRAMUSCULAR
  Filled 2017-11-26: qty 0.5

## 2017-11-26 MED ORDER — ONDANSETRON HCL 4 MG/2ML IJ SOLN
4.0000 mg | Freq: Once | INTRAMUSCULAR | Status: AC
  Administered 2017-11-26: 4 mg via INTRAVENOUS
  Filled 2017-11-26: qty 2

## 2017-11-26 MED ORDER — SODIUM CHLORIDE 0.9 % IV BOLUS
250.0000 mL | Freq: Once | INTRAVENOUS | Status: AC
  Administered 2017-11-26: 250 mL via INTRAVENOUS

## 2017-11-26 MED ORDER — LIDOCAINE-EPINEPHRINE 1 %-1:100000 IJ SOLN
10.0000 mL | Freq: Once | INTRAMUSCULAR | Status: AC
  Administered 2017-11-26: 5 mL via INTRADERMAL
  Filled 2017-11-26: qty 10

## 2017-11-26 MED ORDER — SODIUM CHLORIDE 0.9 % IV SOLN
INTRAVENOUS | Status: DC
  Administered 2017-11-26: 17:00:00 via INTRAVENOUS

## 2017-11-26 MED ORDER — POTASSIUM CHLORIDE IN NACL 20-0.9 MEQ/L-% IV SOLN
INTRAVENOUS | Status: DC
  Administered 2017-11-26: 1000 mL via INTRAVENOUS
  Administered 2017-11-28: 12:00:00 via INTRAVENOUS
  Filled 2017-11-26 (×6): qty 1000

## 2017-11-26 MED ORDER — MORPHINE SULFATE (PF) 4 MG/ML IV SOLN
2.0000 mg | INTRAVENOUS | Status: DC | PRN
  Administered 2017-11-29 – 2017-11-30 (×3): 2 mg via INTRAVENOUS
  Filled 2017-11-26 (×4): qty 1

## 2017-11-26 MED ORDER — ATENOLOL 25 MG PO TABS
50.0000 mg | ORAL_TABLET | Freq: Two times a day (BID) | ORAL | Status: DC
  Administered 2017-11-26 – 2017-11-30 (×6): 50 mg via ORAL
  Filled 2017-11-26: qty 2
  Filled 2017-11-26: qty 1
  Filled 2017-11-26 (×6): qty 2
  Filled 2017-11-26: qty 1
  Filled 2017-11-26: qty 2

## 2017-11-26 MED ORDER — FENTANYL CITRATE (PF) 100 MCG/2ML IJ SOLN
12.5000 ug | INTRAMUSCULAR | Status: DC | PRN
  Administered 2017-11-26: 12.5 ug via INTRAVENOUS
  Filled 2017-11-26: qty 2

## 2017-11-26 MED ORDER — ONDANSETRON HCL 4 MG/2ML IJ SOLN
4.0000 mg | Freq: Four times a day (QID) | INTRAMUSCULAR | Status: DC | PRN
  Administered 2017-11-26: 4 mg via INTRAVENOUS
  Filled 2017-11-26: qty 2

## 2017-11-26 MED ORDER — MORPHINE SULFATE (PF) 4 MG/ML IV SOLN: 0.5000 mg | INTRAVENOUS | Status: DC | PRN

## 2017-11-26 NOTE — Progress Notes (Signed)
Patient ID: Sandy Owens, female   DOB: 08-01-1924, 84 y.o.   MRN: [id number] Pt seen briefly and imaging reviewed. Suffered severe facial injuries in fall today and ENT at bedside. Pt in collar. Severe swelling to face and multiple lost teeth and instability, lip injury, etc. Cannot eval R eye, but L eye open, pupil irreg from cataract surgery. Pt awake and conversant and quite sharp.  Seems to move everything. CT head shows some traumatic SAH, but no mass effect or shift, and hopefully this will be of little clinical significance. CT C-spine shows stable spinous process fxs, and given her age I rec a soft collar for 6 wks.

## 2017-11-26 NOTE — ED Notes (Signed)
Pt in CT.

## 2017-11-26 NOTE — ED Triage Notes (Signed)
Pt here via [facility].  Was walking back from mailbox at 1230 and tripped, and fell, hitting her face on the stairs.  LOC for unknown amount of time.  Pt remained on ground for 3 hours until son noticed pt laying on ground via remote camera and called 911.  Pt presently ao x 4.  No injuries noted, except for face. 18 G L FA.  Vs 173/70, hr 62, O2 sat 98% on RA, rr 16.

## 2017-11-26 NOTE — H&P (Signed)
History   Sandy Owens is an 84 y.o. female.   Chief Complaint:  Chief Complaint  Patient presents with  . Fall    with facial injuries, not on blood thinners    Fall  Associated symptoms include headaches. Pertinent negatives include no abdominal pain, chest pain or fever.  This is a 84 year old female who presents status post a ground-level fall around 1:00 today.  She was found several hours later by family.  She has been in the ER for 3-4 hours and is already been evaluated by facial surgery and neurosurgery.  Trauma has been asked to admit the patient.  There did not appear to be loss of consciousness.  She lives alone and is otherwise been in fairly good health.  During her workup, her CAT scan of the head, face, and neck showed a subarachnoid hemorrhage, multiple facial fractures, teeth avulsions, and several cervical spinous process fractures.  She currently is awake and alert and denies chest pain, shortness of breath, or abdominal pain.  Past Medical History:  Diagnosis Date  . Cancer [facility]    left breast  . Coronary artery disease    CABG - 3- 2001  . Diabetes mellitus without complication ([place])   . Hypertension   . Hypothyroidism   . Neuromuscular disorder ([place])    neuropathy in feet  and legs    Past Surgical History:  Procedure Laterality Date  . ABDOMINAL HYSTERECTOMY    . APPENDECTOMY    . BACK SURGERY    . cabg    . HIP SURGERY      right hip replacement  . JOINT REPLACEMENT     left knee replacement  . MASTECTOMY Left   . SIMPLE MASTECTOMY WITH AXILLARY SENTINEL NODE BIOPSY Left 06/06/2014   Procedure: LEFT SIMPLE MASTECTOMY;  Surgeon: Excell Seltzer, MD;  Location: [place];  Service: General;  Laterality: Left;    Family History  Problem Relation Age of Onset  . Breast cancer Neg Hx    Social History:  reports that she has quit smoking. She has never used smokeless tobacco. She reports that she drank alcohol. She reports that  she does not use drugs.  Allergies  No Known Allergies  Home Medications   (Not in a hospital admission)  Trauma Course   Results for orders placed or performed during the hospital encounter of 11/26/17 (from the past 48 hour(s))  CBG monitoring, ED     Status: Abnormal   Collection Time: 11/26/17  4:09 PM  Result Value Ref Range   Glucose-Capillary 145 (H) 65 - 99 mg/dL  Comprehensive metabolic panel     Status: Abnormal   Collection Time: 11/26/17  4:33 PM  Result Value Ref Range   Sodium 127 (L) 135 - 145 mmol/L   Potassium 3.7 3.5 - 5.1 mmol/L   Chloride 92 (L) 101 - 111 mmol/L   CO2 23 22 - 32 mmol/L   Glucose, Bld 161 (H) 65 - 99 mg/dL   BUN 32 (H) 6 - 20 mg/dL   Creatinine, Ser 1.21 (H) 0.44 - 1.00 mg/dL   Calcium 9.1 8.9 - 10.3 mg/dL   Total Protein 6.0 (L) 6.5 - 8.1 g/dL   Albumin 2.9 (L) 3.5 - 5.0 g/dL   AST 55 (H) 15 - 41 U/L   ALT 32 14 - 54 U/L   Alkaline Phosphatase 66 38 - 126 U/L   Total Bilirubin 0.8 0.3 - 1.2 mg/dL   GFR calc non  Af Amer 38 (L) >60 mL/min   GFR calc Af Amer 44 (L) >60 mL/min    Comment: (NOTE) The eGFR has been calculated using the CKD EPI equation. This calculation has not been validated in all clinical situations. eGFR's persistently <60 mL/min signify possible Chronic Kidney Disease.    Anion gap 12 5 - 15    Comment: Performed at [facility] [address], [place], [place] [address]  CBC with Differential     Status: Abnormal   Collection Time: 11/26/17  4:33 PM  Result Value Ref Range   WBC 18.0 (H) 4.0 - 10.5 K/uL   RBC 4.16 3.87 - 5.11 MIL/uL   Hemoglobin 13.2 12.0 - 15.0 g/dL   HCT 38.7 36.0 - 46.0 %   MCV 93.0 78.0 - 100.0 fL   MCH 31.7 26.0 - 34.0 pg   MCHC 34.1 30.0 - 36.0 g/dL   RDW 13.0 11.5 - 15.5 %   Platelets 235 150 - 400 K/uL   Neutrophils Relative % 84 %   Lymphocytes Relative 9 %   Monocytes Relative 7 %   Eosinophils Relative 0 %   Basophils Relative 0 %   Neutro Abs 15.1 (H) 1.7 - 7.7 K/uL    Lymphs Abs 1.6 0.7 - 4.0 K/uL   Monocytes Absolute 1.3 (H) 0.1 - 1.0 K/uL   Eosinophils Absolute 0.0 0.0 - 0.7 K/uL   Basophils Absolute 0.0 0.0 - 0.1 K/uL   WBC Morphology FEW NEUTROPHIL BANDS NOTED     Comment: Performed at [facility], [place] [address] [place], [place] [address]  Protime-INR     Status: None   Collection Time: 11/26/17  4:33 PM  Result Value Ref Range   Prothrombin Time 13.7 11.4 - 15.2 seconds   INR 1.06     Comment: Performed at [facility], [place] [address], [place], [place] [address]   Ct Head Wo Contrast  Result Date: 11/26/2017 CLINICAL DATA:  [place] forward striking face. Assess poly trauma.History of breast cancer, hypertension and diabetes. EXAM: CT HEAD WITHOUT CONTRAST CT MAXILLOFACIAL WITHOUT CONTRAST CT CERVICAL SPINE WITHOUT CONTRAST TECHNIQUE: Multidetector CT imaging of the head, cervical spine, and maxillofacial structures were performed using the standard protocol without intravenous contrast. Multiplanar CT image reconstructions of the cervical spine and maxillofacial structures were also generated. COMPARISON:  CT cervical spine December 31, 2015 FINDINGS: CT HEAD FINDINGS BRAIN: No intraparenchymal hemorrhage, mass effect nor midline shift. Moderate volume predominately posterior scattered subarachnoid hemorrhage. Patchy white matter changes compatible with mild chronic small vessel ischemic disease, less than expected for age. No parenchymal brain volume loss for age. No hydrocephalus. Basal cisterns are patent. VASCULAR: Severe calcific atherosclerosis of the carotid siphons. SKULL: No skull fracture. No significant scalp soft tissue swelling. OTHER: None. CT MAXILLOFACIAL FINDINGS OSSEOUS: Acute transverse fracture through anterior maxilla including alveolar ridge with avulsion fractures teeth 7, 8, 9. Acute slightly depressed RIGHT nasal bone fracture. Fracture through nasal spine and mildly depressed osseous nasal septum fracture. Intact pterygoid  bodies endplates. No destructive bony lesions. The mandible is intact, the condyles are located. Moderate temporomandibular osteoarthrosis. ORBITS: Ocular globes and orbital contents are nonsuspicious. Status post bilateral ocular lens implants. SINUSES: Hemosinus with maxillary sinus air-fluid levels. Mastoid air cells are well aerated. SOFT TISSUES: RIGHT lower, midface soft tissue swelling with 4.4 x 2.5 cm subcutaneous fat hematoma RIGHT anterior face. RIGHT periorbital soft tissue swelling. Perioral subcutaneous gas compatible with laceration. No radiopaque foreign  bodies. Probable blood products and oral and nasopharynx. CT CERVICAL SPINE FINDINGS ALIGNMENT: Maintained lordosis. Stable grade 1 C3-4 anterolisthesis on degenerative basis. SKULL BASE AND VERTEBRAE: Acute nondisplaced fractures through the spinous process of C4 and C5. SOFT TISSUES AND SPINAL CANAL: Nonacute. Severe calcific atherosclerosis carotid bifurcations, obscuration RIGHT carotid artery. DISC LEVELS: Stable mild to moderate canal stenosis C4-5, C5-6. Severe C3-4, C4-5, C5-6 neural foraminal narrowing. UPPER CHEST: Lung apices are clear. OTHER: None. IMPRESSION: CT HEAD: 1. Acute moderate volume subarachnoid hemorrhage. 2. Otherwise normal noncontrast CT HEAD for age. CT MAXILLOFACIAL: 1. Acute open fracture through anterior maxilla including alveolar ridge with teeth 7, 8 and 9 avulsion fractures. 2. Acute RIGHT nasal bone, osseous nasal septum fractures. 3. Facial contusions and 4.4 x 2.5 cm RIGHT anterior facial hematoma. No postseptal hematoma. CT CERVICAL SPINE: 1. Acute nondisplaced C4 and C5 spinous process fractures. 2. Stable grade 1 C3-4 anterolisthesis on degenerative basis. 3. Severe atherosclerosis with suspected RIGHT carotid artery hemodynamically significant stenosis which could be confirmed with CTA NECK on nonemergent basis. Critical Value/emergent results were called by telephone at the time of interpretation on 11/26/2017  at 5:30 pm to Dr. Daleen Bo , who verbally acknowledged these results. Electronically Signed   By: Elon Alas M.D.   On: 11/26/2017 17:33   Ct Cervical Spine Wo Contrast  Result Date: 11/26/2017 CLINICAL DATA:  [place] forward striking face. Assess poly trauma.History of breast cancer, hypertension and diabetes. EXAM: CT HEAD WITHOUT CONTRAST CT MAXILLOFACIAL WITHOUT CONTRAST CT CERVICAL SPINE WITHOUT CONTRAST TECHNIQUE: Multidetector CT imaging of the head, cervical spine, and maxillofacial structures were performed using the standard protocol without intravenous contrast. Multiplanar CT image reconstructions of the cervical spine and maxillofacial structures were also generated. COMPARISON:  CT cervical spine December 31, 2015 FINDINGS: CT HEAD FINDINGS BRAIN: No intraparenchymal hemorrhage, mass effect nor midline shift. Moderate volume predominately posterior scattered subarachnoid hemorrhage. Patchy white matter changes compatible with mild chronic small vessel ischemic disease, less than expected for age. No parenchymal brain volume loss for age. No hydrocephalus. Basal cisterns are patent. VASCULAR: Severe calcific atherosclerosis of the carotid siphons. SKULL: No skull fracture. No significant scalp soft tissue swelling. OTHER: None. CT MAXILLOFACIAL FINDINGS OSSEOUS: Acute transverse fracture through anterior maxilla including alveolar ridge with avulsion fractures teeth 7, 8, 9. Acute slightly depressed RIGHT nasal bone fracture. Fracture through nasal spine and mildly depressed osseous nasal septum fracture. Intact pterygoid bodies endplates. No destructive bony lesions. The mandible is intact, the condyles are located. Moderate temporomandibular osteoarthrosis. ORBITS: Ocular globes and orbital contents are nonsuspicious. Status post bilateral ocular lens implants. SINUSES: Hemosinus with maxillary sinus air-fluid levels. Mastoid air cells are well aerated. SOFT TISSUES: RIGHT lower, midface  soft tissue swelling with 4.4 x 2.5 cm subcutaneous fat hematoma RIGHT anterior face. RIGHT periorbital soft tissue swelling. Perioral subcutaneous gas compatible with laceration. No radiopaque foreign bodies. Probable blood products and oral and nasopharynx. CT CERVICAL SPINE FINDINGS ALIGNMENT: Maintained lordosis. Stable grade 1 C3-4 anterolisthesis on degenerative basis. SKULL BASE AND VERTEBRAE: Acute nondisplaced fractures through the spinous process of C4 and C5. SOFT TISSUES AND SPINAL CANAL: Nonacute. Severe calcific atherosclerosis carotid bifurcations, obscuration RIGHT carotid artery. DISC LEVELS: Stable mild to moderate canal stenosis C4-5, C5-6. Severe C3-4, C4-5, C5-6 neural foraminal narrowing. UPPER CHEST: Lung apices are clear. OTHER: None. IMPRESSION: CT HEAD: 1. Acute moderate volume subarachnoid hemorrhage. 2. Otherwise normal noncontrast CT HEAD for age. CT MAXILLOFACIAL: 1. Acute open fracture through anterior maxilla including  alveolar ridge with teeth 7, 8 and 9 avulsion fractures. 2. Acute RIGHT nasal bone, osseous nasal septum fractures. 3. Facial contusions and 4.4 x 2.5 cm RIGHT anterior facial hematoma. No postseptal hematoma. CT CERVICAL SPINE: 1. Acute nondisplaced C4 and C5 spinous process fractures. 2. Stable grade 1 C3-4 anterolisthesis on degenerative basis. 3. Severe atherosclerosis with suspected RIGHT carotid artery hemodynamically significant stenosis which could be confirmed with CTA NECK on nonemergent basis. Critical Value/emergent results were called by telephone at the time of interpretation on 11/26/2017 at 5:30 pm to Dr. Daleen Bo , who verbally acknowledged these results. Electronically Signed   By: Elon Alas M.D.   On: 11/26/2017 17:33   Ct Maxillofacial Wo Cm  Result Date: 11/26/2017 CLINICAL DATA:  [place] forward striking face. Assess poly trauma.History of breast cancer, hypertension and diabetes. EXAM: CT HEAD WITHOUT CONTRAST CT MAXILLOFACIAL WITHOUT  CONTRAST CT CERVICAL SPINE WITHOUT CONTRAST TECHNIQUE: Multidetector CT imaging of the head, cervical spine, and maxillofacial structures were performed using the standard protocol without intravenous contrast. Multiplanar CT image reconstructions of the cervical spine and maxillofacial structures were also generated. COMPARISON:  CT cervical spine December 31, 2015 FINDINGS: CT HEAD FINDINGS BRAIN: No intraparenchymal hemorrhage, mass effect nor midline shift. Moderate volume predominately posterior scattered subarachnoid hemorrhage. Patchy white matter changes compatible with mild chronic small vessel ischemic disease, less than expected for age. No parenchymal brain volume loss for age. No hydrocephalus. Basal cisterns are patent. VASCULAR: Severe calcific atherosclerosis of the carotid siphons. SKULL: No skull fracture. No significant scalp soft tissue swelling. OTHER: None. CT MAXILLOFACIAL FINDINGS OSSEOUS: Acute transverse fracture through anterior maxilla including alveolar ridge with avulsion fractures teeth 7, 8, 9. Acute slightly depressed RIGHT nasal bone fracture. Fracture through nasal spine and mildly depressed osseous nasal septum fracture. Intact pterygoid bodies endplates. No destructive bony lesions. The mandible is intact, the condyles are located. Moderate temporomandibular osteoarthrosis. ORBITS: Ocular globes and orbital contents are nonsuspicious. Status post bilateral ocular lens implants. SINUSES: Hemosinus with maxillary sinus air-fluid levels. Mastoid air cells are well aerated. SOFT TISSUES: RIGHT lower, midface soft tissue swelling with 4.4 x 2.5 cm subcutaneous fat hematoma RIGHT anterior face. RIGHT periorbital soft tissue swelling. Perioral subcutaneous gas compatible with laceration. No radiopaque foreign bodies. Probable blood products and oral and nasopharynx. CT CERVICAL SPINE FINDINGS ALIGNMENT: Maintained lordosis. Stable grade 1 C3-4 anterolisthesis on degenerative basis. SKULL  BASE AND VERTEBRAE: Acute nondisplaced fractures through the spinous process of C4 and C5. SOFT TISSUES AND SPINAL CANAL: Nonacute. Severe calcific atherosclerosis carotid bifurcations, obscuration RIGHT carotid artery. DISC LEVELS: Stable mild to moderate canal stenosis C4-5, C5-6. Severe C3-4, C4-5, C5-6 neural foraminal narrowing. UPPER CHEST: Lung apices are clear. OTHER: None. IMPRESSION: CT HEAD: 1. Acute moderate volume subarachnoid hemorrhage. 2. Otherwise normal noncontrast CT HEAD for age. CT MAXILLOFACIAL: 1. Acute open fracture through anterior maxilla including alveolar ridge with teeth 7, 8 and 9 avulsion fractures. 2. Acute RIGHT nasal bone, osseous nasal septum fractures. 3. Facial contusions and 4.4 x 2.5 cm RIGHT anterior facial hematoma. No postseptal hematoma. CT CERVICAL SPINE: 1. Acute nondisplaced C4 and C5 spinous process fractures. 2. Stable grade 1 C3-4 anterolisthesis on degenerative basis. 3. Severe atherosclerosis with suspected RIGHT carotid artery hemodynamically significant stenosis which could be confirmed with CTA NECK on nonemergent basis. Critical Value/emergent results were called by telephone at the time of interpretation on 11/26/2017 at 5:30 pm to Dr. Daleen Bo , who verbally acknowledged these results. Electronically Signed  By: Elon Alas M.D.   On: 11/26/2017 17:33    Review of Systems  Constitutional: Negative for fever.  HENT: Negative for hearing loss.   Eyes: Negative for blurred vision.  Respiratory: Negative for shortness of breath and stridor.   Cardiovascular: Negative for chest pain.  Gastrointestinal: Negative for abdominal pain.  Neurological: Positive for headaches.    Blood pressure (!) 177/62, pulse 64, temperature 97.6 F (36.4 C), temperature source Axillary, resp. rate 19, SpO2 92 %. Physical Exam  Constitutional: She is oriented to person, place, and time. She appears well-nourished. No distress.  HENT:  Left Ear: External ear  normal.  There is significant facial edema and ecchymosis around her eyes and bruising of the face as well as oral injuries  Eyes:  I could currently not evaluate her pupils secondary to her periorbital edema and ecchymosis  Neck: Neck supple. No tracheal deviation present.  Cardiovascular: Normal rate, regular rhythm and intact distal pulses.  Murmur heard. Respiratory: Effort normal and breath sounds normal. No respiratory distress. She exhibits no tenderness.  GI: Soft. There is no tenderness. There is no rebound and no guarding.  Musculoskeletal: Normal range of motion. She exhibits no deformity.  Lymphadenopathy:    She has no cervical adenopathy.  Neurological: She is alert and oriented to person, place, and time.  Skin: She is not diaphoretic. No erythema.  Psychiatric: Her behavior is normal.     Assessment/Plan Multiple trauma status post fall with the following injuries  Traumatic brain injury Multiple facial and oral fractures Cervical spine transverse process fractures  Again, she has been already evaluated by facial surgery and neurosurgery.  She will be admitted to the intensive care unit for close monitoring.  A CT of her head will be repeated in the morning.  I will have to discuss with my partners who might be able to see the patient from a dental and/or oral surgery standpoint while in the hospital.  I discussed this with her and her family who are in agreement Anesa Fronek A 11/26/2017, 7:53 PM   Procedures

## 2017-11-26 NOTE — ED Notes (Signed)
Patient family member staying for the night; Family has been given recliner and warm blankets for comfort-Monique,RN

## 2017-11-26 NOTE — ED Notes (Signed)
ENT Md at [facility]

## 2017-11-26 NOTE — Consult Note (Signed)
Reason for Consult: Facial injury Referring Physician: Daleen Bo, MD  Sandy Owens is an 84 y.o. female.  HPI: Elderly lady, suffered a fall about 1:00 this afternoon.  She was found by her family members a couple of hours later and brought to the emergency department.  She is in pretty good health otherwise.  She does not have dentures.  She is complaining of oral discomfort and nasal obstruction.  She has an intracranial bleed that is going to be observed by neurosurgery.  Past Medical History:  Diagnosis Date  . Cancer [facility])    left breast  . Coronary artery disease    CABG - 3- 2001  . Diabetes mellitus without complication ([place])   . Hypertension   . Hypothyroidism   . Neuromuscular disorder ([place])    neuropathy in feet  and legs    Past Surgical History:  Procedure Laterality Date  . ABDOMINAL HYSTERECTOMY    . APPENDECTOMY    . BACK SURGERY    . cabg    . HIP SURGERY      right hip replacement  . JOINT REPLACEMENT     left knee replacement  . MASTECTOMY Left   . SIMPLE MASTECTOMY WITH AXILLARY SENTINEL NODE BIOPSY Left 06/06/2014   Procedure: LEFT SIMPLE MASTECTOMY;  Surgeon: Excell Seltzer, MD;  Location: [place];  Service: General;  Laterality: Left;    Family History  Problem Relation Age of Onset  . Breast cancer Neg Hx     Social History:  reports that she has quit smoking. She has never used smokeless tobacco. She reports that she drank alcohol. She reports that she does not use drugs.  Allergies: No Known Allergies  Medications: Reviewed  Results for orders placed or performed during the hospital encounter of 11/26/17 (from the past 48 hour(s))  CBG monitoring, ED     Status: Abnormal   Collection Time: 11/26/17  4:09 PM  Result Value Ref Range   Glucose-Capillary 145 (H) 65 - 99 mg/dL  Comprehensive metabolic panel     Status: Abnormal   Collection Time: 11/26/17  4:33 PM  Result Value Ref Range   Sodium 127 (L) 135 -  145 mmol/L   Potassium 3.7 3.5 - 5.1 mmol/L   Chloride 92 (L) 101 - 111 mmol/L   CO2 23 22 - 32 mmol/L   Glucose, Bld 161 (H) 65 - 99 mg/dL   BUN 32 (H) 6 - 20 mg/dL   Creatinine, Ser 1.21 (H) 0.44 - 1.00 mg/dL   Calcium 9.1 8.9 - 10.3 mg/dL   Total Protein 6.0 (L) 6.5 - 8.1 g/dL   Albumin 2.9 (L) 3.5 - 5.0 g/dL   AST 55 (H) 15 - 41 U/L   ALT 32 14 - 54 U/L   Alkaline Phosphatase 66 38 - 126 U/L   Total Bilirubin 0.8 0.3 - 1.2 mg/dL   GFR calc non Af Amer 38 (L) >60 mL/min   GFR calc Af Amer 44 (L) >60 mL/min    Comment: (NOTE) The eGFR has been calculated using the CKD EPI equation. This calculation has not been validated in all clinical situations. eGFR's persistently <60 mL/min signify possible Chronic Kidney Disease.    Anion gap 12 5 - 15    Comment: Performed at [facility] [address] [place],  [address]  CBC with Differential     Status: Abnormal   Collection Time: 11/26/17  4:33 PM  Result Value Ref Range  WBC 18.0 (H) 4.0 - 10.5 K/uL   RBC 4.16 3.87 - 5.11 MIL/uL   Hemoglobin 13.2 12.0 - 15.0 g/dL   HCT 38.7 36.0 - 46.0 %   MCV 93.0 78.0 - 100.0 fL   MCH 31.7 26.0 - 34.0 pg   MCHC 34.1 30.0 - 36.0 g/dL   RDW 13.0 11.5 - 15.5 %   Platelets 235 150 - 400 K/uL   Neutrophils Relative % 84 %   Lymphocytes Relative 9 %   Monocytes Relative 7 %   Eosinophils Relative 0 %   Basophils Relative 0 %   Neutro Abs 15.1 (H) 1.7 - 7.7 K/uL   Lymphs Abs 1.6 0.7 - 4.0 K/uL   Monocytes Absolute 1.3 (H) 0.1 - 1.0 K/uL   Eosinophils Absolute 0.0 0.0 - 0.7 K/uL   Basophils Absolute 0.0 0.0 - 0.1 K/uL   WBC Morphology FEW NEUTROPHIL BANDS NOTED     Comment: Performed at [facility], [place] [address], [place], [place] [address]  Protime-INR     Status: None   Collection Time: 11/26/17  4:33 PM  Result Value Ref Range   Prothrombin Time 13.7 11.4 - 15.2 seconds   INR 1.06     Comment: Performed at [facility], [place] [address], [place],   [address]    Ct Head Wo Contrast  Result Date: 11/26/2017 CLINICAL DATA:  [place] forward striking face. Assess poly trauma.History of breast cancer, hypertension and diabetes. EXAM: CT HEAD WITHOUT CONTRAST CT MAXILLOFACIAL WITHOUT CONTRAST CT CERVICAL SPINE WITHOUT CONTRAST TECHNIQUE: Multidetector CT imaging of the head, cervical spine, and maxillofacial structures were performed using the standard protocol without intravenous contrast. Multiplanar CT image reconstructions of the cervical spine and maxillofacial structures were also generated. COMPARISON:  CT cervical spine December 31, 2015 FINDINGS: CT HEAD FINDINGS BRAIN: No intraparenchymal hemorrhage, mass effect nor midline shift. Moderate volume predominately posterior scattered subarachnoid hemorrhage. Patchy white matter changes compatible with mild chronic small vessel ischemic disease, less than expected for age. No parenchymal brain volume loss for age. No hydrocephalus. Basal cisterns are patent. VASCULAR: Severe calcific atherosclerosis of the carotid siphons. SKULL: No skull fracture. No significant scalp soft tissue swelling. OTHER: None. CT MAXILLOFACIAL FINDINGS OSSEOUS: Acute transverse fracture through anterior maxilla including alveolar ridge with avulsion fractures teeth 7, 8, 9. Acute slightly depressed RIGHT nasal bone fracture. Fracture through nasal spine and mildly depressed osseous nasal septum fracture. Intact pterygoid bodies endplates. No destructive bony lesions. The mandible is intact, the condyles are located. Moderate temporomandibular osteoarthrosis. ORBITS: Ocular globes and orbital contents are nonsuspicious. Status post bilateral ocular lens implants. SINUSES: Hemosinus with maxillary sinus air-fluid levels. Mastoid air cells are well aerated. SOFT TISSUES: RIGHT lower, midface soft tissue swelling with 4.4 x 2.5 cm subcutaneous fat hematoma RIGHT anterior face. RIGHT periorbital soft tissue swelling. Perioral subcutaneous gas  compatible with laceration. No radiopaque foreign bodies. Probable blood products and oral and nasopharynx. CT CERVICAL SPINE FINDINGS ALIGNMENT: Maintained lordosis. Stable grade 1 C3-4 anterolisthesis on degenerative basis. SKULL BASE AND VERTEBRAE: Acute nondisplaced fractures through the spinous process of C4 and C5. SOFT TISSUES AND SPINAL CANAL: Nonacute. Severe calcific atherosclerosis carotid bifurcations, obscuration RIGHT carotid artery. DISC LEVELS: Stable mild to moderate canal stenosis C4-5, C5-6. Severe C3-4, C4-5, C5-6 neural foraminal narrowing. UPPER CHEST: Lung apices are clear. OTHER: None. IMPRESSION: CT HEAD: 1. Acute moderate volume subarachnoid hemorrhage. 2. Otherwise normal noncontrast CT HEAD for age. CT MAXILLOFACIAL: 1. Acute open  fracture through anterior maxilla including alveolar ridge with teeth 7, 8 and 9 avulsion fractures. 2. Acute RIGHT nasal bone, osseous nasal septum fractures. 3. Facial contusions and 4.4 x 2.5 cm RIGHT anterior facial hematoma. No postseptal hematoma. CT CERVICAL SPINE: 1. Acute nondisplaced C4 and C5 spinous process fractures. 2. Stable grade 1 C3-4 anterolisthesis on degenerative basis. 3. Severe atherosclerosis with suspected RIGHT carotid artery hemodynamically significant stenosis which could be confirmed with CTA NECK on nonemergent basis. Critical Value/emergent results were called by telephone at the time of interpretation on 11/26/2017 at 5:30 pm to Dr. Daleen Bo , who verbally acknowledged these results. Electronically Signed   By: Elon Alas M.D.   On: 11/26/2017 17:33   Ct Cervical Spine Wo Contrast  Result Date: 11/26/2017 CLINICAL DATA:  [place] forward striking face. Assess poly trauma.History of breast cancer, hypertension and diabetes. EXAM: CT HEAD WITHOUT CONTRAST CT MAXILLOFACIAL WITHOUT CONTRAST CT CERVICAL SPINE WITHOUT CONTRAST TECHNIQUE: Multidetector CT imaging of the head, cervical spine, and maxillofacial structures were  performed using the standard protocol without intravenous contrast. Multiplanar CT image reconstructions of the cervical spine and maxillofacial structures were also generated. COMPARISON:  CT cervical spine December 31, 2015 FINDINGS: CT HEAD FINDINGS BRAIN: No intraparenchymal hemorrhage, mass effect nor midline shift. Moderate volume predominately posterior scattered subarachnoid hemorrhage. Patchy white matter changes compatible with mild chronic small vessel ischemic disease, less than expected for age. No parenchymal brain volume loss for age. No hydrocephalus. Basal cisterns are patent. VASCULAR: Severe calcific atherosclerosis of the carotid siphons. SKULL: No skull fracture. No significant scalp soft tissue swelling. OTHER: None. CT MAXILLOFACIAL FINDINGS OSSEOUS: Acute transverse fracture through anterior maxilla including alveolar ridge with avulsion fractures teeth 7, 8, 9. Acute slightly depressed RIGHT nasal bone fracture. Fracture through nasal spine and mildly depressed osseous nasal septum fracture. Intact pterygoid bodies endplates. No destructive bony lesions. The mandible is intact, the condyles are located. Moderate temporomandibular osteoarthrosis. ORBITS: Ocular globes and orbital contents are nonsuspicious. Status post bilateral ocular lens implants. SINUSES: Hemosinus with maxillary sinus air-fluid levels. Mastoid air cells are well aerated. SOFT TISSUES: RIGHT lower, midface soft tissue swelling with 4.4 x 2.5 cm subcutaneous fat hematoma RIGHT anterior face. RIGHT periorbital soft tissue swelling. Perioral subcutaneous gas compatible with laceration. No radiopaque foreign bodies. Probable blood products and oral and nasopharynx. CT CERVICAL SPINE FINDINGS ALIGNMENT: Maintained lordosis. Stable grade 1 C3-4 anterolisthesis on degenerative basis. SKULL BASE AND VERTEBRAE: Acute nondisplaced fractures through the spinous process of C4 and C5. SOFT TISSUES AND SPINAL CANAL: Nonacute. Severe  calcific atherosclerosis carotid bifurcations, obscuration RIGHT carotid artery. DISC LEVELS: Stable mild to moderate canal stenosis C4-5, C5-6. Severe C3-4, C4-5, C5-6 neural foraminal narrowing. UPPER CHEST: Lung apices are clear. OTHER: None. IMPRESSION: CT HEAD: 1. Acute moderate volume subarachnoid hemorrhage. 2. Otherwise normal noncontrast CT HEAD for age. CT MAXILLOFACIAL: 1. Acute open fracture through anterior maxilla including alveolar ridge with teeth 7, 8 and 9 avulsion fractures. 2. Acute RIGHT nasal bone, osseous nasal septum fractures. 3. Facial contusions and 4.4 x 2.5 cm RIGHT anterior facial hematoma. No postseptal hematoma. CT CERVICAL SPINE: 1. Acute nondisplaced C4 and C5 spinous process fractures. 2. Stable grade 1 C3-4 anterolisthesis on degenerative basis. 3. Severe atherosclerosis with suspected RIGHT carotid artery hemodynamically significant stenosis which could be confirmed with CTA NECK on nonemergent basis. Critical Value/emergent results were called by telephone at the time of interpretation on 11/26/2017 at 5:30 pm to Dr. Daleen Bo , who verbally  acknowledged these results. Electronically Signed   By: Elon Alas M.D.   On: 11/26/2017 17:33   Ct Maxillofacial Wo Cm  Result Date: 11/26/2017 CLINICAL DATA:  [place] forward striking face. Assess poly trauma.History of breast cancer, hypertension and diabetes. EXAM: CT HEAD WITHOUT CONTRAST CT MAXILLOFACIAL WITHOUT CONTRAST CT CERVICAL SPINE WITHOUT CONTRAST TECHNIQUE: Multidetector CT imaging of the head, cervical spine, and maxillofacial structures were performed using the standard protocol without intravenous contrast. Multiplanar CT image reconstructions of the cervical spine and maxillofacial structures were also generated. COMPARISON:  CT cervical spine December 31, 2015 FINDINGS: CT HEAD FINDINGS BRAIN: No intraparenchymal hemorrhage, mass effect nor midline shift. Moderate volume predominately posterior scattered  subarachnoid hemorrhage. Patchy white matter changes compatible with mild chronic small vessel ischemic disease, less than expected for age. No parenchymal brain volume loss for age. No hydrocephalus. Basal cisterns are patent. VASCULAR: Severe calcific atherosclerosis of the carotid siphons. SKULL: No skull fracture. No significant scalp soft tissue swelling. OTHER: None. CT MAXILLOFACIAL FINDINGS OSSEOUS: Acute transverse fracture through anterior maxilla including alveolar ridge with avulsion fractures teeth 7, 8, 9. Acute slightly depressed RIGHT nasal bone fracture. Fracture through nasal spine and mildly depressed osseous nasal septum fracture. Intact pterygoid bodies endplates. No destructive bony lesions. The mandible is intact, the condyles are located. Moderate temporomandibular osteoarthrosis. ORBITS: Ocular globes and orbital contents are nonsuspicious. Status post bilateral ocular lens implants. SINUSES: Hemosinus with maxillary sinus air-fluid levels. Mastoid air cells are well aerated. SOFT TISSUES: RIGHT lower, midface soft tissue swelling with 4.4 x 2.5 cm subcutaneous fat hematoma RIGHT anterior face. RIGHT periorbital soft tissue swelling. Perioral subcutaneous gas compatible with laceration. No radiopaque foreign bodies. Probable blood products and oral and nasopharynx. CT CERVICAL SPINE FINDINGS ALIGNMENT: Maintained lordosis. Stable grade 1 C3-4 anterolisthesis on degenerative basis. SKULL BASE AND VERTEBRAE: Acute nondisplaced fractures through the spinous process of C4 and C5. SOFT TISSUES AND SPINAL CANAL: Nonacute. Severe calcific atherosclerosis carotid bifurcations, obscuration RIGHT carotid artery. DISC LEVELS: Stable mild to moderate canal stenosis C4-5, C5-6. Severe C3-4, C4-5, C5-6 neural foraminal narrowing. UPPER CHEST: Lung apices are clear. OTHER: None. IMPRESSION: CT HEAD: 1. Acute moderate volume subarachnoid hemorrhage. 2. Otherwise normal noncontrast CT HEAD for age. CT  MAXILLOFACIAL: 1. Acute open fracture through anterior maxilla including alveolar ridge with teeth 7, 8 and 9 avulsion fractures. 2. Acute RIGHT nasal bone, osseous nasal septum fractures. 3. Facial contusions and 4.4 x 2.5 cm RIGHT anterior facial hematoma. No postseptal hematoma. CT CERVICAL SPINE: 1. Acute nondisplaced C4 and C5 spinous process fractures. 2. Stable grade 1 C3-4 anterolisthesis on degenerative basis. 3. Severe atherosclerosis with suspected RIGHT carotid artery hemodynamically significant stenosis which could be confirmed with CTA NECK on nonemergent basis. Critical Value/emergent results were called by telephone at the time of interpretation on 11/26/2017 at 5:30 pm to Dr. Daleen Bo , who verbally acknowledged these results. Electronically Signed   By: Elon Alas M.D.   On: 11/26/2017 17:33    HUO:[id number] except as listed in admit H&P  Blood pressure (!) 169/57, pulse (!) 58, temperature 97.6 F (36.4 C), temperature source Axillary, resp. rate 17, SpO2 94 %.  PHYSICAL EXAM: Overall appearance:  Healthy appearing elderly lady, in no distress, able to carry on conversations. Head/face: Bilateral periorbital edema, worse on the right which unfortunately is her only seeing eye I am unable to visualize the globe. Ears: External  ears look normal . Nose: External nose  is swollen.  Internal nasal exam  reveals diffuse mucosal edema and some accumulation of clotted blood but no evidence of septal hematoma. Oral Cavity/Pharynx:.  Upper and lower lip laceration.  Upper dentition with one missing teeth and the adjacent to teeth are loose with associated alveolar bone.  This is all in the anterior maxilla. Larynx/Hypopharynx: Deferred Neuro:  No identifiable neurologic deficits. Neck: Cervical collar in place.  Studies Reviewed: Maxillofacial CT  Procedures: Closure of lip lacerations.  Upper and lower lips infiltrated with 1% Xylocaine with epinephrine.  Mucosal surface was  lined up appropriately and closed in layers using 4-0 chromic suture. Total length of lacerations was approximately 3 cm. Right upper canine tooth removed from a 3 mm mucosal attachment. She tolerated this well.   Assessment/Plan: Anterior maxillary alveolar fracture with avulsed teeth.  Recommend consultation with dental and/or oral surgery.  She will be admitted to the hospital for observation of the intracranial bleed.  Lip lacerations reapproximated.  No further treatment necessary at this time.  Izora Gala 11/26/2017, 7:01 PM
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
## 2017-11-27 ENCOUNTER — Inpatient Hospital Stay (HOSPITAL_COMMUNITY): Payer: Medicare HMO

## 2017-11-27 LAB — CBC
HCT: 38.1 % (ref 36.0–46.0)
Hemoglobin: 12.6 g/dL (ref 12.0–15.0)
MCH: 30.8 pg (ref 26.0–34.0)
MCHC: 33.1 g/dL (ref 30.0–36.0)
MCV: 93.2 fL (ref 78.0–100.0)
Platelets: 242 10*3/uL (ref 150–400)
RBC: 4.09 MIL/uL (ref 3.87–5.11)
RDW: 12.9 % (ref 11.5–15.5)
WBC: 14.2 10*3/uL — ABNORMAL HIGH (ref 4.0–10.5)

## 2017-11-27 LAB — BASIC METABOLIC PANEL
Anion gap: 10 (ref 5–15)
BUN: 31 mg/dL — ABNORMAL HIGH (ref 6–20)
CO2: 25 mmol/L (ref 22–32)
Calcium: 8.6 mg/dL — ABNORMAL LOW (ref 8.9–10.3)
Chloride: 96 mmol/L — ABNORMAL LOW (ref 101–111)
Creatinine, Ser: 1.3 mg/dL — ABNORMAL HIGH (ref 0.44–1.00)
GFR calc Af Amer: 40 mL/min — ABNORMAL LOW (ref >60)
GFR calc non Af Amer: 35 mL/min — ABNORMAL LOW (ref >60)
Glucose, Bld: 155 mg/dL — ABNORMAL HIGH (ref 65–99)
Potassium: 4.4 mmol/L (ref 3.5–5.1)
Sodium: 131 mmol/L — ABNORMAL LOW (ref 135–145)

## 2017-11-27 LAB — GLUCOSE, CAPILLARY
Glucose-Capillary: 99 mg/dL (ref 65–99)
Glucose-Capillary: 202 mg/dL — ABNORMAL HIGH (ref 65–99)
Glucose-Capillary: 235 mg/dL — ABNORMAL HIGH (ref 65–99)
Glucose-Capillary: 229 mg/dL — ABNORMAL HIGH (ref 65–99)
Glucose-Capillary: 145 mg/dL — ABNORMAL HIGH (ref 65–99)

## 2017-11-27 LAB — MRSA PCR SCREENING: MRSA by PCR: NEGATIVE

## 2017-11-27 MED ORDER — LISINOPRIL 5 MG PO TABS
5.0000 mg | ORAL_TABLET | Freq: Every day | ORAL | Status: DC
  Administered 2017-11-27 – 2017-12-03 (×3): 5 mg via ORAL
  Filled 2017-11-27 (×6): qty 1

## 2017-11-27 MED ORDER — AMLODIPINE BESYLATE 5 MG PO TABS
5.0000 mg | ORAL_TABLET | Freq: Every day | ORAL | Status: DC
  Administered 2017-11-27 – 2017-12-03 (×4): 5 mg via ORAL
  Filled 2017-11-27 (×6): qty 1

## 2017-11-27 MED ORDER — TRAMADOL HCL 50 MG PO TABS
50.0000 mg | ORAL_TABLET | Freq: Four times a day (QID) | ORAL | Status: DC | PRN
  Administered 2017-11-28: 50 mg via ORAL
  Filled 2017-11-27: qty 1

## 2017-11-27 MED ORDER — LEVOTHYROXINE SODIUM 88 MCG PO TABS
88.0000 ug | ORAL_TABLET | Freq: Every day | ORAL | Status: DC
Start: 2017-11-27 — End: 2017-12-03
  Administered 2017-11-27 – 2017-12-03 (×6): 88 ug via ORAL
  Filled 2017-11-27 (×6): qty 1

## 2017-11-27 MED ORDER — HYDROCODONE-ACETAMINOPHEN 5-325 MG PO TABS
1.0000 | ORAL_TABLET | ORAL | Status: DC | PRN
  Administered 2017-11-27 – 2017-12-01 (×11): 1 via ORAL
  Filled 2017-11-27 (×11): qty 1

## 2017-11-27 NOTE — Care Management Note (Signed)
Case Management Note  Patient Details  Name: Sandy Owens MRN: [id number] Date of Birth: [birth year]  Subjective/Objective:  Pt admitted on 11/26/17 s/p ground level fall with TBI, multiple facial and oral fractures, and cervical spine transverse process fractures.  PTA, pt very independent; lives at home alone.  She has supportive daughter at bedside.                    Action/Plan: PT/OT consults pending.  CSW and RN Case Manager offered support to pt/ family.  Oral surgeon planning surgical intervention pending neuro clearance.  Will follow progress.    Expected Discharge Date:                  Expected Discharge Plan:     In-House Referral:  Clinical Social Work  Discharge planning Services  CM Consult  Post Acute Care Choice:    Choice offered to:     DME Arranged:    DME Agency:     HH Arranged:    HH Agency:     Status of Service:  In process, will continue to follow  If discussed at Long Length of Stay Meetings, dates discussed:    Additional Comments:  Reinaldo Raddle, RN, BSN  Trauma/Neuro ICU Case Manager [phone]

## 2017-11-27 NOTE — Progress Notes (Signed)
Subjective/Chief Complaint: COMPLAINS OF BILATERAL SHOULDER PAIN AND LOSS OF TEETH CANNOT OPEN EYES DUE TO SWELLING  SEEN BY ENT AND NSU HEAD CT STABLE    Objective: Vital signs in last 24 hours: Temp:  [97.6 F (36.4 C)-98.2 F (36.8 C)] 98.2 F (36.8 C) (03/28 0400) Pulse Rate:  [56-71] 60 (03/28 0700) Resp:  [14-19] 16 (03/28 0700) BP: (128-188)/(43-69) 146/56 (03/28 0700) SpO2:  [92 %-100 %] 97 % (03/28 0700) Weight:  [59.8 kg (131 lb 13.4 oz)] 59.8 kg (131 lb 13.4 oz) (03/28 0015)    Intake/Output from previous day: 03/27 0701 - 03/28 0700 In: 1177.5 [I.V.:927.5; IV Piggyback:250] Out: 285 [Urine:285] Intake/Output this shift: No intake/output data recorded.  General appearance: alert and cooperative Head: SIGNIFICANT FACIAL SWELLING  Resp: clear to auscultation bilaterally Cardio: regular rate and rhythm, S1, S2 normal, no murmur, click, rub or gallop GI: soft, non-tender; bowel sounds normal; no masses,  no organomegaly Neurologic: Alert and oriented X 3, normal strength and tone. Normal symmetric reflexes. Normal coordination and gait  Lab Results:  Recent Labs    11/26/17 1633 11/27/17 0332  WBC 18.0* 14.2*  HGB 13.2 12.6  HCT 38.7 38.1  PLT 235 242   BMET Recent Labs    11/26/17 1633 11/27/17 0332  NA 127* 131*  K 3.7 4.4  CL 92* 96*  CO2 23 25  GLUCOSE 161* 155*  BUN 32* 31*  CREATININE 1.21* 1.30*  CALCIUM 9.1 8.6*   PT/INR Recent Labs    11/26/17 1633  LABPROT 13.7  INR 1.06   ABG No results for input(s): PHART, HCO3 in the last 72 hours.  Invalid input(s): PCO2, PO2  Studies/Results: Ct Head Wo Contrast  Result Date: 11/27/2017 CLINICAL DATA:  84 y/o  F; follow-up of intracranial hemorrhage. EXAM: CT HEAD WITHOUT CONTRAST TECHNIQUE: Contiguous axial images were obtained from the base of the skull through the vertex without intravenous contrast. COMPARISON:  11/26/2017 CT head. FINDINGS: Brain: Stable moderate volume of  subarachnoid hemorrhage predominantly over the posterior frontal and parietal convexities. No new intracranial hemorrhage, stroke, or focal mass effect identified. No hydrocephalus or herniation. Partially empty sella turcica. Vascular: Calcific atherosclerosis of carotid siphons. No hyperdense vessel identified. Skull: Right parietal region scalp contusion. No calvarial fracture. Sinuses/Orbits: Small air-fluid levels in maxillary sinuses and sphenoid sinus are stable. Stable patchy opacification of ethmoid air cells. Other: Partially visualized hemorrhage within right facial soft tissues is stable. IMPRESSION: 1. Stable moderate volume of subarachnoid hemorrhage. 2. No new acute intracranial abnormality identified. 3. Stable partially visualized hematoma of right facial soft tissues. 4. Stable sinus fluid levels, probably hemorrhage in the setting of trauma. Electronically Signed   By: Kristine Garbe M.D.   On: 11/27/2017 06:36   Ct Head Wo Contrast  Result Date: 11/26/2017 CLINICAL DATA:  [place] forward striking face. Assess poly trauma.History of breast cancer, hypertension and diabetes. EXAM: CT HEAD WITHOUT CONTRAST CT MAXILLOFACIAL WITHOUT CONTRAST CT CERVICAL SPINE WITHOUT CONTRAST TECHNIQUE: Multidetector CT imaging of the head, cervical spine, and maxillofacial structures were performed using the standard protocol without intravenous contrast. Multiplanar CT image reconstructions of the cervical spine and maxillofacial structures were also generated. COMPARISON:  CT cervical spine December 31, 2015 FINDINGS: CT HEAD FINDINGS BRAIN: No intraparenchymal hemorrhage, mass effect nor midline shift. Moderate volume predominately posterior scattered subarachnoid hemorrhage. Patchy white matter changes compatible with mild chronic small vessel ischemic disease, less than expected for age. No parenchymal brain volume loss for age. No  hydrocephalus. Basal cisterns are patent. VASCULAR: Severe calcific  atherosclerosis of the carotid siphons. SKULL: No skull fracture. No significant scalp soft tissue swelling. OTHER: None. CT MAXILLOFACIAL FINDINGS OSSEOUS: Acute transverse fracture through anterior maxilla including alveolar ridge with avulsion fractures teeth 7, 8, 9. Acute slightly depressed RIGHT nasal bone fracture. Fracture through nasal spine and mildly depressed osseous nasal septum fracture. Intact pterygoid bodies endplates. No destructive bony lesions. The mandible is intact, the condyles are located. Moderate temporomandibular osteoarthrosis. ORBITS: Ocular globes and orbital contents are nonsuspicious. Status post bilateral ocular lens implants. SINUSES: Hemosinus with maxillary sinus air-fluid levels. Mastoid air cells are well aerated. SOFT TISSUES: RIGHT lower, midface soft tissue swelling with 4.4 x 2.5 cm subcutaneous fat hematoma RIGHT anterior face. RIGHT periorbital soft tissue swelling. Perioral subcutaneous gas compatible with laceration. No radiopaque foreign bodies. Probable blood products and oral and nasopharynx. CT CERVICAL SPINE FINDINGS ALIGNMENT: Maintained lordosis. Stable grade 1 C3-4 anterolisthesis on degenerative basis. SKULL BASE AND VERTEBRAE: Acute nondisplaced fractures through the spinous process of C4 and C5. SOFT TISSUES AND SPINAL CANAL: Nonacute. Severe calcific atherosclerosis carotid bifurcations, obscuration RIGHT carotid artery. DISC LEVELS: Stable mild to moderate canal stenosis C4-5, C5-6. Severe C3-4, C4-5, C5-6 neural foraminal narrowing. UPPER CHEST: Lung apices are clear. OTHER: None. IMPRESSION: CT HEAD: 1. Acute moderate volume subarachnoid hemorrhage. 2. Otherwise normal noncontrast CT HEAD for age. CT MAXILLOFACIAL: 1. Acute open fracture through anterior maxilla including alveolar ridge with teeth 7, 8 and 9 avulsion fractures. 2. Acute RIGHT nasal bone, osseous nasal septum fractures. 3. Facial contusions and 4.4 x 2.5 cm RIGHT anterior facial hematoma.  No postseptal hematoma. CT CERVICAL SPINE: 1. Acute nondisplaced C4 and C5 spinous process fractures. 2. Stable grade 1 C3-4 anterolisthesis on degenerative basis. 3. Severe atherosclerosis with suspected RIGHT carotid artery hemodynamically significant stenosis which could be confirmed with CTA NECK on nonemergent basis. Critical Value/emergent results were called by telephone at the time of interpretation on 11/26/2017 at 5:30 pm to Dr. Daleen Bo , who verbally acknowledged these results. Electronically Signed   By: Elon Alas M.D.   On: 11/26/2017 17:33   Ct Cervical Spine Wo Contrast  Result Date: 11/26/2017 CLINICAL DATA:  [place] forward striking face. Assess poly trauma.History of breast cancer, hypertension and diabetes. EXAM: CT HEAD WITHOUT CONTRAST CT MAXILLOFACIAL WITHOUT CONTRAST CT CERVICAL SPINE WITHOUT CONTRAST TECHNIQUE: Multidetector CT imaging of the head, cervical spine, and maxillofacial structures were performed using the standard protocol without intravenous contrast. Multiplanar CT image reconstructions of the cervical spine and maxillofacial structures were also generated. COMPARISON:  CT cervical spine December 31, 2015 FINDINGS: CT HEAD FINDINGS BRAIN: No intraparenchymal hemorrhage, mass effect nor midline shift. Moderate volume predominately posterior scattered subarachnoid hemorrhage. Patchy white matter changes compatible with mild chronic small vessel ischemic disease, less than expected for age. No parenchymal brain volume loss for age. No hydrocephalus. Basal cisterns are patent. VASCULAR: Severe calcific atherosclerosis of the carotid siphons. SKULL: No skull fracture. No significant scalp soft tissue swelling. OTHER: None. CT MAXILLOFACIAL FINDINGS OSSEOUS: Acute transverse fracture through anterior maxilla including alveolar ridge with avulsion fractures teeth 7, 8, 9. Acute slightly depressed RIGHT nasal bone fracture. Fracture through nasal spine and mildly depressed  osseous nasal septum fracture. Intact pterygoid bodies endplates. No destructive bony lesions. The mandible is intact, the condyles are located. Moderate temporomandibular osteoarthrosis. ORBITS: Ocular globes and orbital contents are nonsuspicious. Status post bilateral ocular lens implants. SINUSES: Hemosinus with maxillary sinus air-fluid levels. Mastoid  air cells are well aerated. SOFT TISSUES: RIGHT lower, midface soft tissue swelling with 4.4 x 2.5 cm subcutaneous fat hematoma RIGHT anterior face. RIGHT periorbital soft tissue swelling. Perioral subcutaneous gas compatible with laceration. No radiopaque foreign bodies. Probable blood products and oral and nasopharynx. CT CERVICAL SPINE FINDINGS ALIGNMENT: Maintained lordosis. Stable grade 1 C3-4 anterolisthesis on degenerative basis. SKULL BASE AND VERTEBRAE: Acute nondisplaced fractures through the spinous process of C4 and C5. SOFT TISSUES AND SPINAL CANAL: Nonacute. Severe calcific atherosclerosis carotid bifurcations, obscuration RIGHT carotid artery. DISC LEVELS: Stable mild to moderate canal stenosis C4-5, C5-6. Severe C3-4, C4-5, C5-6 neural foraminal narrowing. UPPER CHEST: Lung apices are clear. OTHER: None. IMPRESSION: CT HEAD: 1. Acute moderate volume subarachnoid hemorrhage. 2. Otherwise normal noncontrast CT HEAD for age. CT MAXILLOFACIAL: 1. Acute open fracture through anterior maxilla including alveolar ridge with teeth 7, 8 and 9 avulsion fractures. 2. Acute RIGHT nasal bone, osseous nasal septum fractures. 3. Facial contusions and 4.4 x 2.5 cm RIGHT anterior facial hematoma. No postseptal hematoma. CT CERVICAL SPINE: 1. Acute nondisplaced C4 and C5 spinous process fractures. 2. Stable grade 1 C3-4 anterolisthesis on degenerative basis. 3. Severe atherosclerosis with suspected RIGHT carotid artery hemodynamically significant stenosis which could be confirmed with CTA NECK on nonemergent basis. Critical Value/emergent results were called by  telephone at the time of interpretation on 11/26/2017 at 5:30 pm to Dr. Daleen Bo , who verbally acknowledged these results. Electronically Signed   By: Elon Alas M.D.   On: 11/26/2017 17:33   Ct Maxillofacial Wo Cm  Result Date: 11/26/2017 CLINICAL DATA:  [place] forward striking face. Assess poly trauma.History of breast cancer, hypertension and diabetes. EXAM: CT HEAD WITHOUT CONTRAST CT MAXILLOFACIAL WITHOUT CONTRAST CT CERVICAL SPINE WITHOUT CONTRAST TECHNIQUE: Multidetector CT imaging of the head, cervical spine, and maxillofacial structures were performed using the standard protocol without intravenous contrast. Multiplanar CT image reconstructions of the cervical spine and maxillofacial structures were also generated. COMPARISON:  CT cervical spine December 31, 2015 FINDINGS: CT HEAD FINDINGS BRAIN: No intraparenchymal hemorrhage, mass effect nor midline shift. Moderate volume predominately posterior scattered subarachnoid hemorrhage. Patchy white matter changes compatible with mild chronic small vessel ischemic disease, less than expected for age. No parenchymal brain volume loss for age. No hydrocephalus. Basal cisterns are patent. VASCULAR: Severe calcific atherosclerosis of the carotid siphons. SKULL: No skull fracture. No significant scalp soft tissue swelling. OTHER: None. CT MAXILLOFACIAL FINDINGS OSSEOUS: Acute transverse fracture through anterior maxilla including alveolar ridge with avulsion fractures teeth 7, 8, 9. Acute slightly depressed RIGHT nasal bone fracture. Fracture through nasal spine and mildly depressed osseous nasal septum fracture. Intact pterygoid bodies endplates. No destructive bony lesions. The mandible is intact, the condyles are located. Moderate temporomandibular osteoarthrosis. ORBITS: Ocular globes and orbital contents are nonsuspicious. Status post bilateral ocular lens implants. SINUSES: Hemosinus with maxillary sinus air-fluid levels. Mastoid air cells are well  aerated. SOFT TISSUES: RIGHT lower, midface soft tissue swelling with 4.4 x 2.5 cm subcutaneous fat hematoma RIGHT anterior face. RIGHT periorbital soft tissue swelling. Perioral subcutaneous gas compatible with laceration. No radiopaque foreign bodies. Probable blood products and oral and nasopharynx. CT CERVICAL SPINE FINDINGS ALIGNMENT: Maintained lordosis. Stable grade 1 C3-4 anterolisthesis on degenerative basis. SKULL BASE AND VERTEBRAE: Acute nondisplaced fractures through the spinous process of C4 and C5. SOFT TISSUES AND SPINAL CANAL: Nonacute. Severe calcific atherosclerosis carotid bifurcations, obscuration RIGHT carotid artery. DISC LEVELS: Stable mild to moderate canal stenosis C4-5, C5-6. Severe C3-4, C4-5, C5-6 neural  foraminal narrowing. UPPER CHEST: Lung apices are clear. OTHER: None. IMPRESSION: CT HEAD: 1. Acute moderate volume subarachnoid hemorrhage. 2. Otherwise normal noncontrast CT HEAD for age. CT MAXILLOFACIAL: 1. Acute open fracture through anterior maxilla including alveolar ridge with teeth 7, 8 and 9 avulsion fractures. 2. Acute RIGHT nasal bone, osseous nasal septum fractures. 3. Facial contusions and 4.4 x 2.5 cm RIGHT anterior facial hematoma. No postseptal hematoma. CT CERVICAL SPINE: 1. Acute nondisplaced C4 and C5 spinous process fractures. 2. Stable grade 1 C3-4 anterolisthesis on degenerative basis. 3. Severe atherosclerosis with suspected RIGHT carotid artery hemodynamically significant stenosis which could be confirmed with CTA NECK on nonemergent basis. Critical Value/emergent results were called by telephone at the time of interpretation on 11/26/2017 at 5:30 pm to Dr. Daleen Bo , who verbally acknowledged these results. Electronically Signed   By: Elon Alas M.D.   On: 11/26/2017 17:33    Anti-infectives: Anti-infectives (From admission, onward)   None      Assessment/Plan: Fall- TBI stable   CT repeat ok OOB  Facial fractures- seen by ENT  -  CPM  Loss of dentition - consult oral surgeon   Shoulder pain bilateral- nl exam but check plain films today   DVT prevention  Continue SCD  Keep in unit for now    LOS: 1 day    Marcello Moores A Zetha Kuhar 11/27/2017

## 2017-11-27 NOTE — Consult Note (Signed)
Reason for Consult: facial injury  Sandy Owens is an 84 y.o. female   CC: Dental pain, loose gum tissue   HPI: [address] injuring face yesterday. Sustained dental injuries with laceration to lip as well as intracranial bleed.   Past Medical History:  Diagnosis Date  . Cancer [facility])    left breast  . Coronary artery disease    CABG - 3- 2001  . Diabetes mellitus without complication ([place])   . Hypertension   . Hypothyroidism   . Neuromuscular disorder ([place])    neuropathy in feet  and legs    Past Surgical History:  Procedure Laterality Date  . ABDOMINAL HYSTERECTOMY    . APPENDECTOMY    . BACK SURGERY    . cabg    . HIP SURGERY      right hip replacement  . JOINT REPLACEMENT     left knee replacement  . MASTECTOMY Left   . SIMPLE MASTECTOMY WITH AXILLARY SENTINEL NODE BIOPSY Left 06/06/2014   Procedure: LEFT SIMPLE MASTECTOMY;  Surgeon: Excell Seltzer, MD;  Location: [facility];  Service: General;  Laterality: Left;    Family History  Problem Relation Age of Onset  . Breast cancer Neg Hx     Social History:  reports that she has quit smoking. She has never used smokeless tobacco. She reports that she drank alcohol. She reports that she does not use drugs.  Allergies: No Known Allergies  Medications: I have reviewed the patient's current medications.  Results for orders placed or performed during the hospital encounter of 11/26/17 (from the past 48 hour(s))  CBG monitoring, ED     Status: Abnormal   Collection Time: 11/26/17  4:09 PM  Result Value Ref Range   Glucose-Capillary 145 (H) 65 - 99 mg/dL  Comprehensive metabolic panel     Status: Abnormal   Collection Time: 11/26/17  4:33 PM  Result Value Ref Range   Sodium 127 (L) 135 - 145 mmol/L   Potassium 3.7 3.5 - 5.1 mmol/L   Chloride 92 (L) 101 - 111 mmol/L   CO2 23 22 - 32 mmol/L   Glucose, Bld 161 (H) 65 - 99 mg/dL   BUN 32 (H) 6 - 20 mg/dL   Creatinine, Ser 1.21 (H) 0.44 - 1.00 mg/dL    Calcium 9.1 8.9 - 10.3 mg/dL   Total Protein 6.0 (L) 6.5 - 8.1 g/dL   Albumin 2.9 (L) 3.5 - 5.0 g/dL   AST 55 (H) 15 - 41 U/L   ALT 32 14 - 54 U/L   Alkaline Phosphatase 66 38 - 126 U/L   Total Bilirubin 0.8 0.3 - 1.2 mg/dL   GFR calc non Af Amer 38 (L) >60 mL/min   GFR calc Af Amer 44 (L) >60 mL/min    Comment: (NOTE) The eGFR has been calculated using the CKD EPI equation. This calculation has not been validated in all clinical situations. eGFR's persistently <60 mL/min signify possible Chronic Kidney Disease.    Anion gap 12 5 - 15    Comment: Performed at [facility] [address], [place], [place] [address]  CBC with Differential     Status: Abnormal   Collection Time: 11/26/17  4:33 PM  Result Value Ref Range   WBC 18.0 (H) 4.0 - 10.5 K/uL   RBC 4.16 3.87 - 5.11 MIL/uL   Hemoglobin 13.2 12.0 - 15.0 g/dL   HCT 38.7 36.0 - 46.0 %   MCV 93.0 78.0 -  100.0 fL   MCH 31.7 26.0 - 34.0 pg   MCHC 34.1 30.0 - 36.0 g/dL   RDW 13.0 11.5 - 15.5 %   Platelets 235 150 - 400 K/uL   Neutrophils Relative % 84 %   Lymphocytes Relative 9 %   Monocytes Relative 7 %   Eosinophils Relative 0 %   Basophils Relative 0 %   Neutro Abs 15.1 (H) 1.7 - 7.7 K/uL   Lymphs Abs 1.6 0.7 - 4.0 K/uL   Monocytes Absolute 1.3 (H) 0.1 - 1.0 K/uL   Eosinophils Absolute 0.0 0.0 - 0.7 K/uL   Basophils Absolute 0.0 0.0 - 0.1 K/uL   WBC Morphology FEW NEUTROPHIL BANDS NOTED     Comment: Performed at [facility] [address] [place], [place] [address]  Protime-INR     Status: None   Collection Time: 11/26/17  4:33 PM  Result Value Ref Range   Prothrombin Time 13.7 11.4 - 15.2 seconds   INR 1.06     Comment: Performed at [facility], [place] [address] [place], [place] [address]  Urinalysis, Routine w reflex microscopic     Status: Abnormal   Collection Time: 11/26/17  7:03 PM  Result Value Ref Range   Color, Urine YELLOW YELLOW   APPearance HAZY (A) CLEAR   Specific Gravity, Urine  1.014 1.005 - 1.030   pH 6.0 5.0 - 8.0   Glucose, UA NEGATIVE NEGATIVE mg/dL   Hgb urine dipstick SMALL (A) NEGATIVE   Bilirubin Urine NEGATIVE NEGATIVE   Ketones, ur NEGATIVE NEGATIVE mg/dL   Protein, ur NEGATIVE NEGATIVE mg/dL   Nitrite NEGATIVE NEGATIVE   Leukocytes, UA LARGE (A) NEGATIVE   RBC / HPF 0-5 0 - 5 RBC/hpf   WBC, UA TOO NUMEROUS TO COUNT 0 - 5 WBC/hpf   Bacteria, UA MANY (A) NONE SEEN   Squamous Epithelial / LPF 0-5 (A) NONE SEEN   Mucus PRESENT    Hyaline Casts, UA PRESENT     Comment: Performed at [facility], [place] [address] [place], [place] [address]  MRSA PCR Screening     Status: None   Collection Time: 11/27/17 12:14 AM  Result Value Ref Range   MRSA by PCR NEGATIVE NEGATIVE    Comment:        The GeneXpert MRSA Assay (FDA approved for NASAL specimens only), is one component of a comprehensive MRSA colonization surveillance program. It is not intended to diagnose MRSA infection nor to guide or monitor treatment for MRSA infections. Performed at [facility], [place] [address] [place], [place] [address]   CBC     Status: Abnormal   Collection Time: 11/27/17  3:32 AM  Result Value Ref Range   WBC 14.2 (H) 4.0 - 10.5 K/uL   RBC 4.09 3.87 - 5.11 MIL/uL   Hemoglobin 12.6 12.0 - 15.0 g/dL   HCT 38.1 36.0 - 46.0 %   MCV 93.2 78.0 - 100.0 fL   MCH 30.8 26.0 - 34.0 pg   MCHC 33.1 30.0 - 36.0 g/dL   RDW 12.9 11.5 - 15.5 %   Platelets 242 150 - 400 K/uL    Comment: Performed at [facility], [place] [address], [place], [place] [address]  Basic metabolic panel     Status: Abnormal   Collection Time: 11/27/17  3:32 AM  Result Value Ref Range   Sodium 131 (L) 135 - 145 mmol/L   Potassium 4.4 3.5 -  5.1 mmol/L   Chloride 96 (L) 101 - 111 mmol/L   CO2 25 22 - 32 mmol/L   Glucose, Bld 155 (H) 65 - 99 mg/dL   BUN 31 (H) 6 - 20 mg/dL   Creatinine, Ser 1.30 (H) 0.44 - 1.00 mg/dL   Calcium 8.6 (L) 8.9 - 10.3 mg/dL   GFR calc non Af Amer 35 (L) >60  mL/min   GFR calc Af Amer 40 (L) >60 mL/min    Comment: (NOTE) The eGFR has been calculated using the CKD EPI equation. This calculation has not been validated in all clinical situations. eGFR's persistently <60 mL/min signify possible Chronic Kidney Disease.    Anion gap 10 5 - 15    Comment: Performed at [facility] [address], [place], [place] [address]  Glucose, capillary     Status: None   Collection Time: 11/27/17  8:56 AM  Result Value Ref Range   Glucose-Capillary 99 65 - 99 mg/dL   Comment 1 Notify RN   Glucose, capillary     Status: Abnormal   Collection Time: 11/27/17 11:40 AM  Result Value Ref Range   Glucose-Capillary 202 (H) 65 - 99 mg/dL    Ct Head Wo Contrast  Result Date: 11/27/2017 CLINICAL DATA:  84 y/o  F; follow-up of intracranial hemorrhage. EXAM: CT HEAD WITHOUT CONTRAST TECHNIQUE: Contiguous axial images were obtained from the base of the skull through the vertex without intravenous contrast. COMPARISON:  11/26/2017 CT head. FINDINGS: Brain: Stable moderate volume of subarachnoid hemorrhage predominantly over the posterior frontal and parietal convexities. No new intracranial hemorrhage, stroke, or focal mass effect identified. No hydrocephalus or herniation. Partially empty sella turcica. Vascular: Calcific atherosclerosis of carotid siphons. No hyperdense vessel identified. Skull: Right parietal region scalp contusion. No calvarial fracture. Sinuses/Orbits: Small air-fluid levels in maxillary sinuses and sphenoid sinus are stable. Stable patchy opacification of ethmoid air cells. Other: Partially visualized hemorrhage within right facial soft tissues is stable. IMPRESSION: 1. Stable moderate volume of subarachnoid hemorrhage. 2. No new acute intracranial abnormality identified. 3. Stable partially visualized hematoma of right facial soft tissues. 4. Stable sinus fluid levels, probably hemorrhage in the setting of trauma. Electronically Signed   By: Kristine Garbe M.D.   On: 11/27/2017 06:36   Ct Head Wo Contrast  Result Date: 11/26/2017 CLINICAL DATA:  [address] forward striking face. Assess poly trauma.History of breast cancer, hypertension and diabetes. EXAM: CT HEAD WITHOUT CONTRAST CT MAXILLOFACIAL WITHOUT CONTRAST CT CERVICAL SPINE WITHOUT CONTRAST TECHNIQUE: Multidetector CT imaging of the head, cervical spine, and maxillofacial structures were performed using the standard protocol without intravenous contrast. Multiplanar CT image reconstructions of the cervical spine and maxillofacial structures were also generated. COMPARISON:  CT cervical spine December 31, 2015 FINDINGS: CT HEAD FINDINGS BRAIN: No intraparenchymal hemorrhage, mass effect nor midline shift. Moderate volume predominately posterior scattered subarachnoid hemorrhage. Patchy white matter changes compatible with mild chronic small vessel ischemic disease, less than expected for age. No parenchymal brain volume loss for age. No hydrocephalus. Basal cisterns are patent. VASCULAR: Severe calcific atherosclerosis of the carotid siphons. SKULL: No skull fracture. No significant scalp soft tissue swelling. OTHER: None. CT MAXILLOFACIAL FINDINGS OSSEOUS: Acute transverse fracture through anterior maxilla including alveolar ridge with avulsion fractures teeth 7, 8, 9. Acute slightly depressed RIGHT nasal bone fracture. Fracture through nasal spine and mildly depressed osseous nasal septum fracture. Intact pterygoid bodies endplates. No destructive bony lesions. The mandible is intact, the condyles are located. Moderate temporomandibular  osteoarthrosis. ORBITS: Ocular globes and orbital contents are nonsuspicious. Status post bilateral ocular lens implants. SINUSES: Hemosinus with maxillary sinus air-fluid levels. Mastoid air cells are well aerated. SOFT TISSUES: RIGHT lower, midface soft tissue swelling with 4.4 x 2.5 cm subcutaneous fat hematoma RIGHT anterior face. RIGHT periorbital soft tissue  swelling. Perioral subcutaneous gas compatible with laceration. No radiopaque foreign bodies. Probable blood products and oral and nasopharynx. CT CERVICAL SPINE FINDINGS ALIGNMENT: Maintained lordosis. Stable grade 1 C3-4 anterolisthesis on degenerative basis. SKULL BASE AND VERTEBRAE: Acute nondisplaced fractures through the spinous process of C4 and C5. SOFT TISSUES AND SPINAL CANAL: Nonacute. Severe calcific atherosclerosis carotid bifurcations, obscuration RIGHT carotid artery. DISC LEVELS: Stable mild to moderate canal stenosis C4-5, C5-6. Severe C3-4, C4-5, C5-6 neural foraminal narrowing. UPPER CHEST: Lung apices are clear. OTHER: None. IMPRESSION: CT HEAD: 1. Acute moderate volume subarachnoid hemorrhage. 2. Otherwise normal noncontrast CT HEAD for age. CT MAXILLOFACIAL: 1. Acute open fracture through anterior maxilla including alveolar ridge with teeth 7, 8 and 9 avulsion fractures. 2. Acute RIGHT nasal bone, osseous nasal septum fractures. 3. Facial contusions and 4.4 x 2.5 cm RIGHT anterior facial hematoma. No postseptal hematoma. CT CERVICAL SPINE: 1. Acute nondisplaced C4 and C5 spinous process fractures. 2. Stable grade 1 C3-4 anterolisthesis on degenerative basis. 3. Severe atherosclerosis with suspected RIGHT carotid artery hemodynamically significant stenosis which could be confirmed with CTA NECK on nonemergent basis. Critical Value/emergent results were called by telephone at the time of interpretation on 11/26/2017 at 5:30 pm to Dr. Daleen Bo , who verbally acknowledged these results. Electronically Signed   By: Elon Alas M.D.   On: 11/26/2017 17:33   Ct Cervical Spine Wo Contrast  Result Date: 11/26/2017 CLINICAL DATA:  [place] forward striking face. Assess poly trauma.History of breast cancer, hypertension and diabetes. EXAM: CT HEAD WITHOUT CONTRAST CT MAXILLOFACIAL WITHOUT CONTRAST CT CERVICAL SPINE WITHOUT CONTRAST TECHNIQUE: Multidetector CT imaging of the head, cervical spine,  and maxillofacial structures were performed using the standard protocol without intravenous contrast. Multiplanar CT image reconstructions of the cervical spine and maxillofacial structures were also generated. COMPARISON:  CT cervical spine December 31, 2015 FINDINGS: CT HEAD FINDINGS BRAIN: No intraparenchymal hemorrhage, mass effect nor midline shift. Moderate volume predominately posterior scattered subarachnoid hemorrhage. Patchy white matter changes compatible with mild chronic small vessel ischemic disease, less than expected for age. No parenchymal brain volume loss for age. No hydrocephalus. Basal cisterns are patent. VASCULAR: Severe calcific atherosclerosis of the carotid siphons. SKULL: No skull fracture. No significant scalp soft tissue swelling. OTHER: None. CT MAXILLOFACIAL FINDINGS OSSEOUS: Acute transverse fracture through anterior maxilla including alveolar ridge with avulsion fractures teeth 7, 8, 9. Acute slightly depressed RIGHT nasal bone fracture. Fracture through nasal spine and mildly depressed osseous nasal septum fracture. Intact pterygoid bodies endplates. No destructive bony lesions. The mandible is intact, the condyles are located. Moderate temporomandibular osteoarthrosis. ORBITS: Ocular globes and orbital contents are nonsuspicious. Status post bilateral ocular lens implants. SINUSES: Hemosinus with maxillary sinus air-fluid levels. Mastoid air cells are well aerated. SOFT TISSUES: RIGHT lower, midface soft tissue swelling with 4.4 x 2.5 cm subcutaneous fat hematoma RIGHT anterior face. RIGHT periorbital soft tissue swelling. Perioral subcutaneous gas compatible with laceration. No radiopaque foreign bodies. Probable blood products and oral and nasopharynx. CT CERVICAL SPINE FINDINGS ALIGNMENT: Maintained lordosis. Stable grade 1 C3-4 anterolisthesis on degenerative basis. SKULL BASE AND VERTEBRAE: Acute nondisplaced fractures through the spinous process of C4 and C5. SOFT TISSUES AND  SPINAL CANAL:  Nonacute. Severe calcific atherosclerosis carotid bifurcations, obscuration RIGHT carotid artery. DISC LEVELS: Stable mild to moderate canal stenosis C4-5, C5-6. Severe C3-4, C4-5, C5-6 neural foraminal narrowing. UPPER CHEST: Lung apices are clear. OTHER: None. IMPRESSION: CT HEAD: 1. Acute moderate volume subarachnoid hemorrhage. 2. Otherwise normal noncontrast CT HEAD for age. CT MAXILLOFACIAL: 1. Acute open fracture through anterior maxilla including alveolar ridge with teeth 7, 8 and 9 avulsion fractures. 2. Acute RIGHT nasal bone, osseous nasal septum fractures. 3. Facial contusions and 4.4 x 2.5 cm RIGHT anterior facial hematoma. No postseptal hematoma. CT CERVICAL SPINE: 1. Acute nondisplaced C4 and C5 spinous process fractures. 2. Stable grade 1 C3-4 anterolisthesis on degenerative basis. 3. Severe atherosclerosis with suspected RIGHT carotid artery hemodynamically significant stenosis which could be confirmed with CTA NECK on nonemergent basis. Critical Value/emergent results were called by telephone at the time of interpretation on 11/26/2017 at 5:30 pm to Dr. Daleen Bo , who verbally acknowledged these results. Electronically Signed   By: Elon Alas M.D.   On: 11/26/2017 17:33   Ct Maxillofacial Wo Cm  Result Date: 11/26/2017 CLINICAL DATA:  [place] forward striking face. Assess poly trauma.History of breast cancer, hypertension and diabetes. EXAM: CT HEAD WITHOUT CONTRAST CT MAXILLOFACIAL WITHOUT CONTRAST CT CERVICAL SPINE WITHOUT CONTRAST TECHNIQUE: Multidetector CT imaging of the head, cervical spine, and maxillofacial structures were performed using the standard protocol without intravenous contrast. Multiplanar CT image reconstructions of the cervical spine and maxillofacial structures were also generated. COMPARISON:  CT cervical spine December 31, 2015 FINDINGS: CT HEAD FINDINGS BRAIN: No intraparenchymal hemorrhage, mass effect nor midline shift. Moderate volume  predominately posterior scattered subarachnoid hemorrhage. Patchy white matter changes compatible with mild chronic small vessel ischemic disease, less than expected for age. No parenchymal brain volume loss for age. No hydrocephalus. Basal cisterns are patent. VASCULAR: Severe calcific atherosclerosis of the carotid siphons. SKULL: No skull fracture. No significant scalp soft tissue swelling. OTHER: None. CT MAXILLOFACIAL FINDINGS OSSEOUS: Acute transverse fracture through anterior maxilla including alveolar ridge with avulsion fractures teeth 7, 8, 9. Acute slightly depressed RIGHT nasal bone fracture. Fracture through nasal spine and mildly depressed osseous nasal septum fracture. Intact pterygoid bodies endplates. No destructive bony lesions. The mandible is intact, the condyles are located. Moderate temporomandibular osteoarthrosis. ORBITS: Ocular globes and orbital contents are nonsuspicious. Status post bilateral ocular lens implants. SINUSES: Hemosinus with maxillary sinus air-fluid levels. Mastoid air cells are well aerated. SOFT TISSUES: RIGHT lower, midface soft tissue swelling with 4.4 x 2.5 cm subcutaneous fat hematoma RIGHT anterior face. RIGHT periorbital soft tissue swelling. Perioral subcutaneous gas compatible with laceration. No radiopaque foreign bodies. Probable blood products and oral and nasopharynx. CT CERVICAL SPINE FINDINGS ALIGNMENT: Maintained lordosis. Stable grade 1 C3-4 anterolisthesis on degenerative basis. SKULL BASE AND VERTEBRAE: Acute nondisplaced fractures through the spinous process of C4 and C5. SOFT TISSUES AND SPINAL CANAL: Nonacute. Severe calcific atherosclerosis carotid bifurcations, obscuration RIGHT carotid artery. DISC LEVELS: Stable mild to moderate canal stenosis C4-5, C5-6. Severe C3-4, C4-5, C5-6 neural foraminal narrowing. UPPER CHEST: Lung apices are clear. OTHER: None. IMPRESSION: CT HEAD: 1. Acute moderate volume subarachnoid hemorrhage. 2. Otherwise normal  noncontrast CT HEAD for age. CT MAXILLOFACIAL: 1. Acute open fracture through anterior maxilla including alveolar ridge with teeth 7, 8 and 9 avulsion fractures. 2. Acute RIGHT nasal bone, osseous nasal septum fractures. 3. Facial contusions and 4.4 x 2.5 cm RIGHT anterior facial hematoma. No postseptal hematoma. CT CERVICAL SPINE: 1. Acute nondisplaced C4 and C5 spinous  process fractures. 2. Stable grade 1 C3-4 anterolisthesis on degenerative basis. 3. Severe atherosclerosis with suspected RIGHT carotid artery hemodynamically significant stenosis which could be confirmed with CTA NECK on nonemergent basis. Critical Value/emergent results were called by telephone at the time of interpretation on 11/26/2017 at 5:30 pm to Dr. Daleen Bo , who verbally acknowledged these results. Electronically Signed   By: Elon Alas M.D.   On: 11/26/2017 17:33    ROS Blood pressure 117/68, pulse 64, temperature 98.9 F (37.2 C), temperature source Axillary, resp. rate 20, height 5' (1.524 m), weight 131 lb 13.4 oz (59.8 kg), SpO2 97 %. General appearance: cooperative and no distress Head: Normocephalic, without obvious abnormality Eyes: Bilateral periorbital edema and ecchymosis. Nose: dried heme. No obvious deformity Throat: Avulsed tooth #7 with gingival laceration, Subluxated anterior maxillary 3 unit bridge from teeth 8 to 10 with exposed roots. Missing crown of tooth # 11. Pharynx clear. hemostatic Neck: no adenopathy and supple, symmetrical, trachea midline  Assessment/Plan: Anterior maxillary fracture with avulsed and subluxed teeth. Will obtain panorex radiograph to better visualize dental injuries. Will contact neuro to see if patient can safely undergo surgery from neuro standpoint.   Diona Browner 11/27/2017, 12:07 PM

## 2017-11-27 NOTE — Progress Notes (Signed)
Patient ID: Sandy Owens, female   DOB: 08-28-1924, 84 y.o.   MRN: [id number] Looks better, no headache, c/o arms aching but only with mvmt of arms, no NT, in collar, awake conversant. Will sign off, but f/u with me 2-3 wks post d/c

## 2017-11-28 ENCOUNTER — Inpatient Hospital Stay (HOSPITAL_COMMUNITY): Payer: Medicare HMO | Admitting: Certified Registered"

## 2017-11-28 ENCOUNTER — Encounter (HOSPITAL_COMMUNITY): Admission: EM | Disposition: A | Discharge status: Rehabilitation Facility | Payer: Self-pay | Source: Home / Self Care

## 2017-11-28 HISTORY — PX: MULTIPLE EXTRACTIONS WITH ALVEOLOPLASTY: SHX5342

## 2017-11-28 LAB — URINALYSIS, ROUTINE W REFLEX MICROSCOPIC
Bilirubin Urine: NEGATIVE
Glucose, UA: NEGATIVE mg/dL
Ketones, ur: NEGATIVE mg/dL
Nitrite: NEGATIVE
Protein, ur: 100 mg/dL — AB
Specific Gravity, Urine: 1.014 (ref 1.005–1.030)
pH: 5 (ref 5.0–8.0)

## 2017-11-28 LAB — SURGICAL PCR SCREEN
MRSA, PCR: NEGATIVE
Staphylococcus aureus: NEGATIVE

## 2017-11-28 LAB — GLUCOSE, CAPILLARY
Glucose-Capillary: 97 mg/dL (ref 65–99)
Glucose-Capillary: 148 mg/dL — ABNORMAL HIGH (ref 65–99)
Glucose-Capillary: 159 mg/dL — ABNORMAL HIGH (ref 65–99)
Glucose-Capillary: 235 mg/dL — ABNORMAL HIGH (ref 65–99)
Glucose-Capillary: 182 mg/dL — ABNORMAL HIGH (ref 65–99)

## 2017-11-28 SURGERY — MULTIPLE EXTRACTION WITH ALVEOLOPLASTY
Anesthesia: General | Site: Mouth

## 2017-11-28 MED ORDER — FENTANYL CITRATE (PF) 100 MCG/2ML IJ SOLN
INTRAMUSCULAR | Status: DC | PRN
  Administered 2017-11-28 (×3): 25 ug via INTRAVENOUS

## 2017-11-28 MED ORDER — OXYMETAZOLINE HCL 0.05 % NA SOLN
NASAL | Status: DC | PRN
  Administered 2017-11-28: 1

## 2017-11-28 MED ORDER — PROPOFOL 10 MG/ML IV BOLUS
INTRAVENOUS | Status: AC
  Filled 2017-11-28: qty 20

## 2017-11-28 MED ORDER — OXYMETAZOLINE HCL 0.05 % NA SOLN
NASAL | Status: AC
  Filled 2017-11-28: qty 15

## 2017-11-28 MED ORDER — SUCCINYLCHOLINE CHLORIDE 20 MG/ML IJ SOLN
INTRAMUSCULAR | Status: DC | PRN
  Administered 2017-11-28: 40 mg via INTRAVENOUS

## 2017-11-28 MED ORDER — 0.9 % SODIUM CHLORIDE (POUR BTL) OPTIME
TOPICAL | Status: DC | PRN
  Administered 2017-11-28: 1000 mL

## 2017-11-28 MED ORDER — SODIUM CHLORIDE 0.9 % IR SOLN
Status: DC | PRN
  Administered 2017-11-28: 1000 mL

## 2017-11-28 MED ORDER — LIDOCAINE HCL (CARDIAC) 20 MG/ML IV SOLN
INTRAVENOUS | Status: DC | PRN
  Administered 2017-11-28: 60 mg via INTRAVENOUS

## 2017-11-28 MED ORDER — PHENOL 1.4 % MT LIQD
1.0000 | OROMUCOSAL | Status: DC | PRN
  Administered 2017-11-28: 1 via OROMUCOSAL
  Filled 2017-11-28: qty 177

## 2017-11-28 MED ORDER — CEFAZOLIN SODIUM-DEXTROSE 2-4 GM/100ML-% IV SOLN
2.0000 g | INTRAVENOUS | Status: AC
  Administered 2017-11-28: 2 g via INTRAVENOUS
  Filled 2017-11-28: qty 100

## 2017-11-28 MED ORDER — FENTANYL CITRATE (PF) 100 MCG/2ML IJ SOLN: 25.0000 ug | INTRAMUSCULAR | Status: DC | PRN

## 2017-11-28 MED ORDER — ESMOLOL HCL 100 MG/10ML IV SOLN
INTRAVENOUS | Status: DC | PRN
  Administered 2017-11-28: 10 mg via INTRAVENOUS
  Administered 2017-11-28: 30 mg via INTRAVENOUS
  Administered 2017-11-28 (×3): 10 mg via INTRAVENOUS

## 2017-11-28 MED ORDER — LACTATED RINGERS IV SOLN
INTRAVENOUS | Status: DC | PRN
  Administered 2017-11-28: 10:00:00 via INTRAVENOUS

## 2017-11-28 MED ORDER — ONDANSETRON HCL 4 MG/2ML IJ SOLN
INTRAMUSCULAR | Status: DC | PRN
  Administered 2017-11-28: 4 mg via INTRAVENOUS

## 2017-11-28 MED ORDER — LACTATED RINGERS IV SOLN
INTRAVENOUS | Status: DC
  Administered 2017-11-28: 09:00:00 via INTRAVENOUS

## 2017-11-28 MED ORDER — FENTANYL CITRATE (PF) 250 MCG/5ML IJ SOLN
INTRAMUSCULAR | Status: AC
  Filled 2017-11-28: qty 5

## 2017-11-28 MED ORDER — LIDOCAINE-EPINEPHRINE 2 %-1:100000 IJ SOLN
INTRAMUSCULAR | Status: AC
Start: 2017-11-28 — End: 2017-11-28
  Filled 2017-11-28: qty 1

## 2017-11-28 MED ORDER — LIDOCAINE-EPINEPHRINE 2 %-1:100000 IJ SOLN
INTRAMUSCULAR | Status: DC | PRN
  Administered 2017-11-28: 13 mL via INTRADERMAL

## 2017-11-28 MED ORDER — CHLORHEXIDINE GLUCONATE 0.12 % MT SOLN
15.0000 mL | Freq: Two times a day (BID) | OROMUCOSAL | Status: DC
  Administered 2017-11-28 – 2017-12-03 (×10): 15 mL via OROMUCOSAL
  Filled 2017-11-28 (×10): qty 15

## 2017-11-28 MED ORDER — PROPOFOL 10 MG/ML IV BOLUS
INTRAVENOUS | Status: DC | PRN
  Administered 2017-11-28: 80 mg via INTRAVENOUS

## 2017-11-28 MED ORDER — ORAL CARE MOUTH RINSE
15.0000 mL | Freq: Two times a day (BID) | OROMUCOSAL | Status: DC
  Administered 2017-11-28 – 2017-12-03 (×11): 15 mL via OROMUCOSAL

## 2017-11-28 SURGICAL SUPPLY — 29 items
BUR CROSS CUT FISSURE 1.6 (BURR) ×2 IMPLANT
BUR EGG ELITE 4.0 (BURR) ×2 IMPLANT
CANISTER SUCT 3000ML PPV (MISCELLANEOUS) ×2 IMPLANT
COVER SURGICAL LIGHT HANDLE (MISCELLANEOUS) ×2 IMPLANT
CRADLE DONUT ADULT HEAD (MISCELLANEOUS) ×2 IMPLANT
DECANTER SPIKE VIAL GLASS SM (MISCELLANEOUS) IMPLANT
DRAPE U-SHAPE 76X120 STRL (DRAPES) ×2 IMPLANT
FLUID NSS /IRRIG 1000 ML XXX (MISCELLANEOUS) ×2 IMPLANT
GAUZE PACKING FOLDED 2  STR (GAUZE/BANDAGES/DRESSINGS) ×1
GAUZE PACKING FOLDED 2 STR (GAUZE/BANDAGES/DRESSINGS) ×1 IMPLANT
GLOVE BIO SURGEON STRL SZ 6.5 (GLOVE) ×2 IMPLANT
GLOVE BIO SURGEON STRL SZ7.5 (GLOVE) ×2 IMPLANT
GLOVE BIOGEL PI IND STRL 7.0 (GLOVE) IMPLANT
GLOVE BIOGEL PI INDICATOR 7.0 (GLOVE)
GOWN STRL REUS W/ TWL LRG LVL3 (GOWN DISPOSABLE) ×1 IMPLANT
GOWN STRL REUS W/ TWL XL LVL3 (GOWN DISPOSABLE) ×1 IMPLANT
GOWN STRL REUS W/TWL LRG LVL3 (GOWN DISPOSABLE) ×1
GOWN STRL REUS W/TWL XL LVL3 (GOWN DISPOSABLE) ×1
KIT BASIN OR (CUSTOM PROCEDURE TRAY) ×2 IMPLANT
KIT TURNOVER KIT B (KITS) ×2 IMPLANT
NEEDLE 22X1 1/2 (OR ONLY) (NEEDLE) ×4 IMPLANT
NEEDLE 27GAX1X1/2 (NEEDLE) IMPLANT
NS IRRIG 1000ML POUR BTL (IV SOLUTION) ×2 IMPLANT
PAD ARMBOARD 7.5X6 YLW CONV (MISCELLANEOUS) ×2 IMPLANT
SUT CHROMIC 3 0 PS 2 (SUTURE) ×2 IMPLANT
SYR CONTROL 10ML LL (SYRINGE) ×2 IMPLANT
TRAY ENT MC OR (CUSTOM PROCEDURE TRAY) ×2 IMPLANT
TUBING IRRIGATION (MISCELLANEOUS) ×2 IMPLANT
YANKAUER SUCT BULB TIP NO VENT (SUCTIONS) ×2 IMPLANT

## 2017-11-28 NOTE — Anesthesia Preprocedure Evaluation (Signed)
Anesthesia Evaluation  Patient identified by MRN, date of birth, ID band Patient awake    Reviewed: Allergy & Precautions, NPO status , Patient's Chart, lab work & pertinent test results, reviewed documented beta blocker date and time   History of Anesthesia Complications Negative for: history of anesthetic complications  Airway Mallampati: IV  TM Distance: >3 FB Neck ROM: Limited  Mouth opening: Limited Mouth Opening  Dental  (+) Poor Dentition, Missing, Loose   Pulmonary neg shortness of breath, neg sleep apnea, neg COPD, neg recent URI, former smoker,    breath sounds clear to auscultation       Cardiovascular hypertension, Pt. on medications and Pt. on home beta blockers + CAD and + CABG   Rhythm:Regular     Neuro/Psych neg Seizures  Neuromuscular disease negative psych ROS   GI/Hepatic negative GI ROS, Neg liver ROS,   Endo/Other  diabetes, Type 2Hypothyroidism   Renal/GU negative Renal ROS     Musculoskeletal   Abdominal   Peds  Hematology negative hematology ROS (+)   Anesthesia Other Findings   Reproductive/Obstetrics                             Anesthesia Physical Anesthesia Plan  ASA: III  Anesthesia Plan: General   Post-op Pain Management:    Induction: Intravenous  PONV Risk Score and Plan: 3 and Ondansetron and Treatment may vary due to age or medical condition  Airway Management Planned: Video Laryngoscope Planned  Additional Equipment: None  Intra-op Plan:   Post-operative Plan: Extubation in OR  Informed Consent: I have reviewed the patients History and Physical, chart, labs and discussed the procedure including the risks, benefits and alternatives for the proposed anesthesia with the patient or authorized representative who has indicated his/her understanding and acceptance.   Dental advisory given and Consent reviewed with POA  Plan Discussed with: CRNA and  Surgeon  Anesthesia Plan Comments:         Anesthesia Quick Evaluation

## 2017-11-28 NOTE — Evaluation (Signed)
Physical Therapy Evaluation Patient Details Name: Sandy Owens MRN: [id number] DOB: [birth year] Today's Date: 11/28/2017   History of Present Illness  84 y.o. female admitted on 11/26/17 for fall with resultant moderate volume SAH, C4/5 spinous process fractures (soft collar), R carotid stenosis, facial fxs (open anterior maxilla including alveolar ridge with teetch 7,8,9 avulsion fx, R nasal bone fx, nasal septum fx, facial contusions, and right anterior facial hematoma, multiple lip lacs s/p closure) and oral surgery on 11/28/17  extraction of teeth # 8, 10, 11, 13, open reduction alvolar fx, enamloplasty teeth # 20 -28.  Pt with significant PMH of neuromuscular d/o, HTN, DM, CAD, L breast CA s/p L mastectomy, L TKA, CABG, and back surgery.  Clinical Impression  Pt is limited by pain and weakness.  Her "good" R eye is swollen shut, and she was only able to stand EOB and take side steps with me today.  She was quite independent PTA and would benefit from post acute rehab prior to returning home at discharge.   PT to follow acutely for deficits listed below.       Follow Up Recommendations CIR    Equipment Recommendations  Rolling walker with 5" wheels    Recommendations for Other Services Rehab consult     Precautions / Restrictions Precautions Precautions: Fall      Mobility  Bed Mobility Overal bed mobility: Needs Assistance Bed Mobility: Supine to Sit;Sit to Supine;Rolling Rolling: Min assist   Supine to sit: HOB elevated;Min assist Sit to supine: HOB elevated;Min assist   General bed mobility comments: Min assist to partially roll, partially sit up from [facility] elevated to EOB.  Min assist to help lift feet back into bed from sitting.   Transfers Overall transfer level: Needs assistance Equipment used: 1 person hand held assist Transfers: Sit to/from Stand Sit to Stand: Min assist         General transfer comment: Min assist to steady trunk for balance.    Ambulation/Gait Ambulation/Gait assistance: Min assist Ambulation Distance (Feet): 5 Feet Assistive device: 1 person hand held assist Gait Pattern/deviations: Step-to pattern     General Gait Details: side step to [facility] to be in a better position to return to supine.  Pt did not want to get up OOB to chair yet.          Balance Overall balance assessment: Needs assistance Sitting-balance support: Feet supported;Bilateral upper extremity supported;No upper extremity supported Sitting balance-Leahy Scale: Good     Standing balance support: Single extremity supported Standing balance-Leahy Scale: Poor Standing balance comment: needs external assist for balance in standing.                              Pertinent Vitals/Pain Pain Assessment: Faces Faces Pain Scale: Hurts whole lot Pain Location: posterior neck Pain Descriptors / Indicators: Grimacing;Guarding Pain Intervention(s): Limited activity within patient's tolerance;Monitored during session;Repositioned    Home Living Family/patient expects to be discharged to:: Private residence Living Arrangements: Alone Available Help at Discharge: Family;Available PRN/intermittently Type of Home: House       Home Layout: Laundry or work area in basement;Multi-level(laundry)        Prior Function Level of Independence: Independent         Comments: reports she does not drive due to poor vision at baseline, she does not normally walk with an AD, she manages her bills with help from her daughter and she uses a pill  box for her meds.      Hand Dominance   Dominant Hand: Right    Extremity/Trunk Assessment   Upper Extremity Assessment Upper Extremity Assessment: Defer to OT evaluation    Lower Extremity Assessment Lower Extremity Assessment: Generalized weakness    Cervical / Trunk Assessment Cervical / Trunk Assessment: Other exceptions Cervical / Trunk Exceptions: soft cervial brace    Communication   Communication: No difficulties  Cognition Arousal/Alertness: Awake/alert Behavior During Therapy: WFL for tasks assessed/performed Overall Cognitive Status: History of cognitive impairments - at baseline                                 General Comments: Pt reports history of memory impairments, but otherwise today A and O x4 and very good detalied report of home situation.              Assessment/Plan    PT Assessment Patient needs continued PT services  PT Problem List Decreased strength;Decreased activity tolerance;Decreased balance;Decreased mobility;Decreased knowledge of use of DME;Pain       PT Treatment Interventions DME instruction;Gait training;Stair training;Functional mobility training;Therapeutic activities;Therapeutic exercise;Balance training;Neuromuscular re-education;Patient/family education    PT Goals (Current goals can be found in the Care Plan section)  Acute Rehab PT Goals Patient Stated Goal: to get better so she can go home.  PT Goal Formulation: With patient Time For Goal Achievement: 12/12/17 Potential to Achieve Goals: Good    Frequency Min 3X/week   Barriers to discharge Inaccessible home environment;Decreased caregiver support has stairs in home, lives alone    AM-PAC PT "6 Clicks" Daily Activity  Outcome Measure Difficulty turning over in bed (including adjusting bedclothes, sheets and blankets)?: Unable Difficulty moving from lying on back to sitting on the side of the bed? : Unable Difficulty sitting down on and standing up from a chair with arms (e.g., wheelchair, bedside commode, etc,.)?: Unable Help needed moving to and from a bed to chair (including a wheelchair)?: A Little Help needed walking in hospital room?: A Little Help needed climbing 3-5 steps with a railing? : A Lot 6 Click Score: 11    End of Session Equipment Utilized During Treatment: Cervical collar Activity Tolerance: Patient limited by  pain Patient left: in bed;with call bell/phone within reach;with bed alarm set   PT Visit Diagnosis: History of falling (Z91.81);Other symptoms and signs involving the nervous system (R29.898);Pain Pain - Right/Left: (neck) Pain - part of body: (neck)    Time: 6433-2951 PT Time Calculation (min) (ACUTE ONLY): 30 min   Charges:           Wells Guiles B. Dontay Harm, PT, DPT [phone]   PT Evaluation $PT Eval Moderate Complexity: 1 Mod PT Treatments $Therapeutic Activity: 8-22 mins   11/28/2017, 3:56 PM

## 2017-11-28 NOTE — Anesthesia Procedure Notes (Addendum)
Procedure Name: Intubation Date/Time: 11/28/2017 10:19 AM Performed by: Adalberto Ill, CRNA Pre-anesthesia Checklist: Patient identified, Emergency Drugs available, Suction available, Patient being monitored and Timeout performed Patient Re-evaluated:Patient Re-evaluated prior to induction Oxygen Delivery Method: Circle system utilized Preoxygenation: Pre-oxygenation with 100% oxygen Induction Type: IV induction Ventilation: Mask ventilation without difficulty Laryngoscope Size: Glidescope and 3 Grade View: Grade I Tube type: nasal RAE. Nasal Tubes: Nasal prep performed and Nasal Rae Tube size: 6.5 mm Number of attempts: 1 (brief dentition unchanged no trauma that wasn't already present) Airway Equipment and Method: Video-laryngoscopy Placement Confirmation: ETT inserted through vocal cords under direct vision,  positive ETCO2 and breath sounds checked- equal and bilateral Secured at: 21 cm Dental Injury: Teeth and Oropharynx as per pre-operative assessment and Bloody posterior oropharynx

## 2017-11-28 NOTE — Op Note (Signed)
11/28/2017  10:41 AM  PATIENT:  Sandy Owens  84 y.o. female  PRE-OPERATIVE DIAGNOSIS:  MAXILLARY ALVEOLAR FRACTURE, SUBLUXATED TEETH, GINGIVAL LACERATIONS  POST-OPERATIVE DIAGNOSIS:  SAME+ INCISAL ENAMEL FRACTURES TEETH # 20-28  PROCEDURE:  Procedure(s): EXTRACTION TEETH # 8, 10, 11, 13, OPEN REDUCTION ALVEOLAR FRACTURE, ENAMELOPLASTY TEETH # 20 -28  SURGEON:  Surgeon(s): Diona Browner, DDS  ANESTHESIA:   local and general  EBL:  minimal  DRAINS: none   SPECIMEN:  No Specimen  COUNTS:  YES  PLAN OF CARE: Discharge to home after PACU  PATIENT DISPOSITION:  PACU - hemodynamically stable.   PROCEDURE DETAILS: Dictation # [id number]  Gae Bon, DMD 11/28/2017 10:41 AM

## 2017-11-28 NOTE — Progress Notes (Addendum)
PT Cancellation Note  Patient Details Name: PARYS ELENBAAS MRN: [id number] DOB: Jun 03, 1924   Cancelled Treatment:    Reason Eval/Treat Not Completed: Patient at procedure or test/unavailable.  Pt is in procedure. PT to check back later as time allows.  Thanks,    Barbarann Ehlers. Hawthorne, Esperanza, DPT [phone]   11/28/2017, 9:42 AM

## 2017-11-28 NOTE — Progress Notes (Addendum)
Subjective/Chief Complaint: No acute changes. Planning OR this AM.    Objective: Vital signs in last 24 hours: Temp:  [96.6 F (35.9 C)-98.2 F (36.8 C)] 98 F (36.7 C) (03/29 0400) Pulse Rate:  [49-64] 59 (03/29 0800) Resp:  [14-22] 18 (03/29 0800) BP: (88-137)/(36-68) 135/45 (03/29 0800) SpO2:  [88 %-100 %] 92 % (03/29 0800) Last BM Date: (pta)  Intake/Output from previous day: 03/28 0701 - 03/29 0700 In: 2720 [P.O.:920; I.V.:1800] Out: -  Intake/Output this shift: No intake/output data recorded.  General appearance: alert and cooperative Head: facial swelling/ecchymosis  Resp: clear to auscultation bilaterally Cardio: regular rate and rhythm, no pedal edema GI: soft, non-tender; bowel sounds normal; no masses,  no organomegaly Neurologic: Alert and oriented X 3, normal strength and tone.   Lab Results:  Recent Labs    11/26/17 1633 11/27/17 0332  WBC 18.0* 14.2*  HGB 13.2 12.6  HCT 38.7 38.1  PLT 235 242   BMET Recent Labs    11/26/17 1633 11/27/17 0332  NA 127* 131*  K 3.7 4.4  CL 92* 96*  CO2 23 25  GLUCOSE 161* 155*  BUN 32* 31*  CREATININE 1.21* 1.30*  CALCIUM 9.1 8.6*   PT/INR Recent Labs    11/26/17 1633  LABPROT 13.7  INR 1.06   ABG No results for input(s): PHART, HCO3 in the last 72 hours.  Invalid input(s): PCO2, PO2  Studies/Results: Dg Orthopantogram  Result Date: 11/27/2017 CLINICAL DATA:  Fall down steps with face bruising. EXAM: ORTHOPANTOGRAM/PANORAMIC COMPARISON:  Maxillofacial CT from yesterday FINDINGS: Central alveolar ridge fracture of the maxilla is obscured by artifact. Traumatic dislocation of right tooth 7. The other central incisors are located. Notable periapical erosion around the remaining right lower molar. Negative for mandibular fracture or dislocation. IMPRESSION: 1. Known central maxilla alveolar ridge fracture and incisor root loosening that is not well demonstrated on this study. Traumatic extraction of  tooth 7 on preceding CT. 2. Located and intact mandible. Electronically Signed   By: Monte Fantasia M.D.   On: 11/27/2017 13:40   Dg Shoulder Right  Result Date: 11/27/2017 CLINICAL DATA:  Fall.  Soreness both shoulders. EXAM: RIGHT SHOULDER - 2+ VIEW COMPARISON:  No recent. FINDINGS: Acromioclavicular glenohumeral degenerative change. No evidence of fracture dislocation. Diffuse osteopenia. IMPRESSION: Acromioclavicular glenohumeral degenerative change. No acute abnormality. No evidence of fracture or dislocation. Electronically Signed   By: Marcello Moores  Register   On: 11/27/2017 13:35   Ct Head Wo Contrast  Result Date: 11/27/2017 CLINICAL DATA:  84 y/o  F; follow-up of intracranial hemorrhage. EXAM: CT HEAD WITHOUT CONTRAST TECHNIQUE: Contiguous axial images were obtained from the base of the skull through the vertex without intravenous contrast. COMPARISON:  11/26/2017 CT head. FINDINGS: Brain: Stable moderate volume of subarachnoid hemorrhage predominantly over the posterior frontal and parietal convexities. No new intracranial hemorrhage, stroke, or focal mass effect identified. No hydrocephalus or herniation. Partially empty sella turcica. Vascular: Calcific atherosclerosis of carotid siphons. No hyperdense vessel identified. Skull: Right parietal region scalp contusion. No calvarial fracture. Sinuses/Orbits: Small air-fluid levels in maxillary sinuses and sphenoid sinus are stable. Stable patchy opacification of ethmoid air cells. Other: Partially visualized hemorrhage within right facial soft tissues is stable. IMPRESSION: 1. Stable moderate volume of subarachnoid hemorrhage. 2. No new acute intracranial abnormality identified. 3. Stable partially visualized hematoma of right facial soft tissues. 4. Stable sinus fluid levels, probably hemorrhage in the setting of trauma. Electronically Signed   By: Edgardo Roys.D.  On: 11/27/2017 06:36   Ct Head Wo Contrast  Result Date:  11/26/2017 CLINICAL DATA:  [place] forward striking face. Assess poly trauma.History of breast cancer, hypertension and diabetes. EXAM: CT HEAD WITHOUT CONTRAST CT MAXILLOFACIAL WITHOUT CONTRAST CT CERVICAL SPINE WITHOUT CONTRAST TECHNIQUE: Multidetector CT imaging of the head, cervical spine, and maxillofacial structures were performed using the standard protocol without intravenous contrast. Multiplanar CT image reconstructions of the cervical spine and maxillofacial structures were also generated. COMPARISON:  CT cervical spine December 31, 2015 FINDINGS: CT HEAD FINDINGS BRAIN: No intraparenchymal hemorrhage, mass effect nor midline shift. Moderate volume predominately posterior scattered subarachnoid hemorrhage. Patchy white matter changes compatible with mild chronic small vessel ischemic disease, less than expected for age. No parenchymal brain volume loss for age. No hydrocephalus. Basal cisterns are patent. VASCULAR: Severe calcific atherosclerosis of the carotid siphons. SKULL: No skull fracture. No significant scalp soft tissue swelling. OTHER: None. CT MAXILLOFACIAL FINDINGS OSSEOUS: Acute transverse fracture through anterior maxilla including alveolar ridge with avulsion fractures teeth 7, 8, 9. Acute slightly depressed RIGHT nasal bone fracture. Fracture through nasal spine and mildly depressed osseous nasal septum fracture. Intact pterygoid bodies endplates. No destructive bony lesions. The mandible is intact, the condyles are located. Moderate temporomandibular osteoarthrosis. ORBITS: Ocular globes and orbital contents are nonsuspicious. Status post bilateral ocular lens implants. SINUSES: Hemosinus with maxillary sinus air-fluid levels. Mastoid air cells are well aerated. SOFT TISSUES: RIGHT lower, midface soft tissue swelling with 4.4 x 2.5 cm subcutaneous fat hematoma RIGHT anterior face. RIGHT periorbital soft tissue swelling. Perioral subcutaneous gas compatible with laceration. No radiopaque foreign  bodies. Probable blood products and oral and nasopharynx. CT CERVICAL SPINE FINDINGS ALIGNMENT: Maintained lordosis. Stable grade 1 C3-4 anterolisthesis on degenerative basis. SKULL BASE AND VERTEBRAE: Acute nondisplaced fractures through the spinous process of C4 and C5. SOFT TISSUES AND SPINAL CANAL: Nonacute. Severe calcific atherosclerosis carotid bifurcations, obscuration RIGHT carotid artery. DISC LEVELS: Stable mild to moderate canal stenosis C4-5, C5-6. Severe C3-4, C4-5, C5-6 neural foraminal narrowing. UPPER CHEST: Lung apices are clear. OTHER: None. IMPRESSION: CT HEAD: 1. Acute moderate volume subarachnoid hemorrhage. 2. Otherwise normal noncontrast CT HEAD for age. CT MAXILLOFACIAL: 1. Acute open fracture through anterior maxilla including alveolar ridge with teeth 7, 8 and 9 avulsion fractures. 2. Acute RIGHT nasal bone, osseous nasal septum fractures. 3. Facial contusions and 4.4 x 2.5 cm RIGHT anterior facial hematoma. No postseptal hematoma. CT CERVICAL SPINE: 1. Acute nondisplaced C4 and C5 spinous process fractures. 2. Stable grade 1 C3-4 anterolisthesis on degenerative basis. 3. Severe atherosclerosis with suspected RIGHT carotid artery hemodynamically significant stenosis which could be confirmed with CTA NECK on nonemergent basis. Critical Value/emergent results were called by telephone at the time of interpretation on 11/26/2017 at 5:30 pm to Dr. Daleen Bo , who verbally acknowledged these results. Electronically Signed   By: Elon Alas M.D.   On: 11/26/2017 17:33   Ct Cervical Spine Wo Contrast  Result Date: 11/26/2017 CLINICAL DATA:  [place] forward striking face. Assess poly trauma.History of breast cancer, hypertension and diabetes. EXAM: CT HEAD WITHOUT CONTRAST CT MAXILLOFACIAL WITHOUT CONTRAST CT CERVICAL SPINE WITHOUT CONTRAST TECHNIQUE: Multidetector CT imaging of the head, cervical spine, and maxillofacial structures were performed using the standard protocol without  intravenous contrast. Multiplanar CT image reconstructions of the cervical spine and maxillofacial structures were also generated. COMPARISON:  CT cervical spine December 31, 2015 FINDINGS: CT HEAD FINDINGS BRAIN: No intraparenchymal hemorrhage, mass effect nor midline shift. Moderate volume predominately posterior scattered  subarachnoid hemorrhage. Patchy white matter changes compatible with mild chronic small vessel ischemic disease, less than expected for age. No parenchymal brain volume loss for age. No hydrocephalus. Basal cisterns are patent. VASCULAR: Severe calcific atherosclerosis of the carotid siphons. SKULL: No skull fracture. No significant scalp soft tissue swelling. OTHER: None. CT MAXILLOFACIAL FINDINGS OSSEOUS: Acute transverse fracture through anterior maxilla including alveolar ridge with avulsion fractures teeth 7, 8, 9. Acute slightly depressed RIGHT nasal bone fracture. Fracture through nasal spine and mildly depressed osseous nasal septum fracture. Intact pterygoid bodies endplates. No destructive bony lesions. The mandible is intact, the condyles are located. Moderate temporomandibular osteoarthrosis. ORBITS: Ocular globes and orbital contents are nonsuspicious. Status post bilateral ocular lens implants. SINUSES: Hemosinus with maxillary sinus air-fluid levels. Mastoid air cells are well aerated. SOFT TISSUES: RIGHT lower, midface soft tissue swelling with 4.4 x 2.5 cm subcutaneous fat hematoma RIGHT anterior face. RIGHT periorbital soft tissue swelling. Perioral subcutaneous gas compatible with laceration. No radiopaque foreign bodies. Probable blood products and oral and nasopharynx. CT CERVICAL SPINE FINDINGS ALIGNMENT: Maintained lordosis. Stable grade 1 C3-4 anterolisthesis on degenerative basis. SKULL BASE AND VERTEBRAE: Acute nondisplaced fractures through the spinous process of C4 and C5. SOFT TISSUES AND SPINAL CANAL: Nonacute. Severe calcific atherosclerosis carotid bifurcations,  obscuration RIGHT carotid artery. DISC LEVELS: Stable mild to moderate canal stenosis C4-5, C5-6. Severe C3-4, C4-5, C5-6 neural foraminal narrowing. UPPER CHEST: Lung apices are clear. OTHER: None. IMPRESSION: CT HEAD: 1. Acute moderate volume subarachnoid hemorrhage. 2. Otherwise normal noncontrast CT HEAD for age. CT MAXILLOFACIAL: 1. Acute open fracture through anterior maxilla including alveolar ridge with teeth 7, 8 and 9 avulsion fractures. 2. Acute RIGHT nasal bone, osseous nasal septum fractures. 3. Facial contusions and 4.4 x 2.5 cm RIGHT anterior facial hematoma. No postseptal hematoma. CT CERVICAL SPINE: 1. Acute nondisplaced C4 and C5 spinous process fractures. 2. Stable grade 1 C3-4 anterolisthesis on degenerative basis. 3. Severe atherosclerosis with suspected RIGHT carotid artery hemodynamically significant stenosis which could be confirmed with CTA NECK on nonemergent basis. Critical Value/emergent results were called by telephone at the time of interpretation on 11/26/2017 at 5:30 pm to Dr. Daleen Bo , who verbally acknowledged these results. Electronically Signed   By: Elon Alas M.D.   On: 11/26/2017 17:33   Dg Shoulder Left  Result Date: 11/27/2017 CLINICAL DATA:  Fall down steps with soreness to both shoulders. EXAM: LEFT SHOULDER - 2+ VIEW COMPARISON:  None. FINDINGS: Negative for acute fracture. Advanced glenohumeral osteoarthritis with joint narrowing and bulky inferior spurring. Osteopenia. IMPRESSION: 1. No acute finding. 2. Advanced glenohumeral osteoarthritis. Electronically Signed   By: Monte Fantasia M.D.   On: 11/27/2017 13:35   Ct Maxillofacial Wo Cm  Result Date: 11/26/2017 CLINICAL DATA:  [place] forward striking face. Assess poly trauma.History of breast cancer, hypertension and diabetes. EXAM: CT HEAD WITHOUT CONTRAST CT MAXILLOFACIAL WITHOUT CONTRAST CT CERVICAL SPINE WITHOUT CONTRAST TECHNIQUE: Multidetector CT imaging of the head, cervical spine, and  maxillofacial structures were performed using the standard protocol without intravenous contrast. Multiplanar CT image reconstructions of the cervical spine and maxillofacial structures were also generated. COMPARISON:  CT cervical spine December 31, 2015 FINDINGS: CT HEAD FINDINGS BRAIN: No intraparenchymal hemorrhage, mass effect nor midline shift. Moderate volume predominately posterior scattered subarachnoid hemorrhage. Patchy white matter changes compatible with mild chronic small vessel ischemic disease, less than expected for age. No parenchymal brain volume loss for age. No hydrocephalus. Basal cisterns are patent. VASCULAR: Severe calcific atherosclerosis of the  carotid siphons. SKULL: No skull fracture. No significant scalp soft tissue swelling. OTHER: None. CT MAXILLOFACIAL FINDINGS OSSEOUS: Acute transverse fracture through anterior maxilla including alveolar ridge with avulsion fractures teeth 7, 8, 9. Acute slightly depressed RIGHT nasal bone fracture. Fracture through nasal spine and mildly depressed osseous nasal septum fracture. Intact pterygoid bodies endplates. No destructive bony lesions. The mandible is intact, the condyles are located. Moderate temporomandibular osteoarthrosis. ORBITS: Ocular globes and orbital contents are nonsuspicious. Status post bilateral ocular lens implants. SINUSES: Hemosinus with maxillary sinus air-fluid levels. Mastoid air cells are well aerated. SOFT TISSUES: RIGHT lower, midface soft tissue swelling with 4.4 x 2.5 cm subcutaneous fat hematoma RIGHT anterior face. RIGHT periorbital soft tissue swelling. Perioral subcutaneous gas compatible with laceration. No radiopaque foreign bodies. Probable blood products and oral and nasopharynx. CT CERVICAL SPINE FINDINGS ALIGNMENT: Maintained lordosis. Stable grade 1 C3-4 anterolisthesis on degenerative basis. SKULL BASE AND VERTEBRAE: Acute nondisplaced fractures through the spinous process of C4 and C5. SOFT TISSUES AND SPINAL  CANAL: Nonacute. Severe calcific atherosclerosis carotid bifurcations, obscuration RIGHT carotid artery. DISC LEVELS: Stable mild to moderate canal stenosis C4-5, C5-6. Severe C3-4, C4-5, C5-6 neural foraminal narrowing. UPPER CHEST: Lung apices are clear. OTHER: None. IMPRESSION: CT HEAD: 1. Acute moderate volume subarachnoid hemorrhage. 2. Otherwise normal noncontrast CT HEAD for age. CT MAXILLOFACIAL: 1. Acute open fracture through anterior maxilla including alveolar ridge with teeth 7, 8 and 9 avulsion fractures. 2. Acute RIGHT nasal bone, osseous nasal septum fractures. 3. Facial contusions and 4.4 x 2.5 cm RIGHT anterior facial hematoma. No postseptal hematoma. CT CERVICAL SPINE: 1. Acute nondisplaced C4 and C5 spinous process fractures. 2. Stable grade 1 C3-4 anterolisthesis on degenerative basis. 3. Severe atherosclerosis with suspected RIGHT carotid artery hemodynamically significant stenosis which could be confirmed with CTA NECK on nonemergent basis. Critical Value/emergent results were called by telephone at the time of interpretation on 11/26/2017 at 5:30 pm to Dr. Daleen Bo , who verbally acknowledged these results. Electronically Signed   By: Elon Alas M.D.   On: 11/26/2017 17:33    Anti-infectives: Anti-infectives (From admission, onward)   None      Assessment/Plan: Ground level Fall-   TBI stable (moderate volume SAH)   CT repeat ok OOB. F/u Dr Ronnald Ramp in 2-3 wks post dc  C4 and C5 spinous process fractures- soft collar in place  R carotid stenosis- Incidental finding "Severe atherosclerosis with suspected RIGHT carotid artery hemodynamically significant stenosis which could be confirmed with CTA NECK on nonemergent basis"   Facial fractures- open anterior maxilla including alveolar ridge with teeth 7,8,9 avulsion fx; right nasal bone fx and nasal septum fx, facial contusions and right anterior facial hematoma: seen & lip lac closed by ENT Dr. Constance Holster; seen by oral surgery-  OR today with Dr. Hoyt Koch for extractions  Shoulder pain bilateral- nl exam, bilateral plain films with no acute injury, arthritis bilaterally   DVT prevention  Continue SCD  Keep in Unit. After OR plan advance diet as able and transfer to stepdown. Continue therapies.   LOS: 2 days    Clovis Riley 11/28/2017

## 2017-11-28 NOTE — Progress Notes (Signed)
Pt had 446mL in bladder when RN bladder scanned pt. Paged Dr. Barry Dienes, verbal order to place foley for retention.

## 2017-11-28 NOTE — H&P (Signed)
H&P documentation  -History and Physical Reviewed  -Patient has been re-examined  -No change in the plan of care  Sandy Owens  

## 2017-11-28 NOTE — Plan of Care (Signed)
Patient alert and oriented. Tolerating ambulation to bathroom with walker and 1 assist, with occasional shoulder pain. Pain managed well with PO meds and heat packs. Patient unable to wrap injured lips around incentive spirometer, so she was instructed to cough and deep breathe frequently. Family at bedside updated and supportive for patient.

## 2017-11-28 NOTE — Op Note (Signed)
Sandy Owens, Sandy Owens             ACCOUNT NO.:  [id number]  MEDICAL RECORD NO.:  [id number]  LOCATION:  4N32C                        FACILITY:  [facility]  PHYSICIAN:  Gae Bon, M.D.  DATE OF BIRTH:  Dec 17, 1923  DATE OF PROCEDURE:  11/28/2017 DATE OF DISCHARGE:                              OPERATIVE REPORT   PREOPERATIVE DIAGNOSES:  Maxillary alveolar fracture, subluxated teeth #10, gingival lacerations.  POSTOPERATIVE DIAGNOSES:  Maxillary alveolar fracture, subluxated teeth #10, gingival lacerations, incisal enamel fractures teeth #20, #21, #22, #23, #24, #25, #26, #27, #28.  PROCEDURES:  Extraction of teeth #8, #10, #11, #13; open reduction of alveolar fracture, enameloplasty teeth #20 through #28, suture gingival laceration.  SURGEON:  Gae Bon, M.D.  ANESTHESIA:  General.  ATTENDING:  Dr. Ermalene Postin, nasal intubation procedure.  INDICATIONS FOR PROCEDURE:  Sandy Owens is a 84 year old female who fell unwitnessed at her residence and landed on the concrete.  She sustained injuries to the face and mouth and as well as subarachnoid hemorrhage, cervical spinous process fractures.  Examination at bedside prior to surgery was limited due to the patient's pain level and swelling.  There were obviously some displaced teeth that would require removal.  This was discussed with the patient and family and elected to proceed with the surgery to treat the fracture, suture lacerations, and remove teeth as needed.  DESCRIPTION OF PROCEDURE:  The patient was taken to the operating room, placed on the table in a supine position.  General anesthesia was administered intravenously, and a nasal endotracheal tube was placed and secured.  The eyes were protected, and the patient was draped for surgery.  Time-out was performed.  The posterior pharynx was suctioned. A throat pack was placed.  A 2% lidocaine with 1:100,000 of epinephrine was infiltrated in the maxilla around teeth #8 through  #10 and carrying laterally to tooth #13, both buccally and palatally.  A total of 12 mL was utilized.  Examination showed there was a 3 unit bridge from teeth #8 to #10.  Tooth #8 was completely avulsed from the socket as it had been since the time of injury.  Tooth #10 was grossly mobile.  Decision was made to remove this bridge which was done with the rongeurs.  Then, tooth #11 was examined and found to have formally had a dental crown in place, but the crown was missing secondary to the injury; therefore, it was elected to remove this tooth using the upper universal forceps. Tooth #13 previously had a root canal, and there was no crown remaining, so the decision was made to remove this tooth as well and it was removed with a dental forceps in a simple fashion.  Then, the right maxillary bridge was examined and found to have good stability without mobility or damage.  The tissue was trimmed to allow for closure, and the alveolar fracture was addressed, it was repositioned.  Bone fragments were repositioned to their proximal positions, and then, the gingival tissue was addressed.  There was a flap of tissue that had been torn from the attached gingival mucosa overlying tooth #5 and #6 as well as #7 and #8. This tissue was trimmed to remove nonviable portion, and then,  this was sutured with 3-0 chromic in the proper position for stability.  The remainder of the maxillary incision was closed with 3-0 chromic. Attention was then turned to the mandible, and there were no mobile or missing teeth; however, there was enamel incisal edge fractures on teeth #20, #21, #22, #23, #24, #25, #26, #27, #28 with remaining sharp edges from porcelain.  This was smoothed back with the Stryker handpiece and fissure bur to remove the sharp areas.  It was not felt that extraction was necessary at this time.  Then, the oral cavity was inspected and found to have good closure and hemostasis.  The oral cavity  was irrigated and suctioned, and throat pack was removed.  The patient was left in the care of Anesthesia for extubation and transportation to the ICU.  EBL:  Minimal.  COMPLICATIONS:  None.  SPECIMENS:  None.     Gae Bon, M.D.     SMJ/MEDQ  D:  11/28/2017  T:  11/28/2017  Job:  [id number]

## 2017-11-28 NOTE — Transfer of Care (Signed)
Immediate Anesthesia Transfer of Care Note  Patient: Sandy Owens  Procedure(s) Performed: MULTIPLE EXTRACTION WITH ALVEOLOPLASTY (N/A Mouth)  Patient Location: PACU  Anesthesia Type:General  Level of Consciousness: awake, alert , oriented and patient cooperative  Airway & Oxygen Therapy: Patient Spontanous Breathing and Patient connected to face mask oxygen  Post-op Assessment: Report given to RN, Post -op Vital signs reviewed and stable and Patient moving all extremities X 4  Post vital signs: Reviewed and stable  Last Vitals:  Vitals Value Taken Time  BP 133/57 11/28/2017 10:57 AM  Temp    Pulse 97 11/28/2017 11:01 AM  Resp 19 11/28/2017 11:01 AM  SpO2 95 % 11/28/2017 11:01 AM  Vitals shown include unvalidated device data.  Last Pain:  Vitals:   11/28/17 0800  TempSrc:   PainSc: 0-No pain      Patients Stated Pain Goal: 0 (21/19/41 7408)  Complications: No apparent anesthesia complications

## 2017-11-28 NOTE — Progress Notes (Signed)
Patient arrived to unit from 4NICU, vitals stable, oriented to room/unit. Questions answered. Continue to monitor patient.

## 2017-11-29 LAB — GLUCOSE, CAPILLARY
Glucose-Capillary: 130 mg/dL — ABNORMAL HIGH (ref 65–99)
Glucose-Capillary: 222 mg/dL — ABNORMAL HIGH (ref 65–99)
Glucose-Capillary: 123 mg/dL — ABNORMAL HIGH (ref 65–99)
Glucose-Capillary: 130 mg/dL — ABNORMAL HIGH (ref 65–99)

## 2017-11-29 MED ORDER — INSULIN ASPART 100 UNIT/ML ~~LOC~~ SOLN
0.0000 [IU] | Freq: Three times a day (TID) | SUBCUTANEOUS | Status: DC
  Administered 2017-11-29: 1 [IU] via SUBCUTANEOUS
  Administered 2017-11-29: 3 [IU] via SUBCUTANEOUS
  Administered 2017-11-30: 1 [IU] via SUBCUTANEOUS
  Administered 2017-11-30: 2 [IU] via SUBCUTANEOUS
  Administered 2017-12-01: 1 [IU] via SUBCUTANEOUS
  Administered 2017-12-01: 3 [IU] via SUBCUTANEOUS
  Administered 2017-12-02 – 2017-12-03 (×2): 2 [IU] via SUBCUTANEOUS

## 2017-11-29 MED ORDER — LEVOTHYROXINE SODIUM 88 MCG PO TABS: 88.0000 ug | ORAL_TABLET | Freq: Every day | ORAL | Status: DC

## 2017-11-29 NOTE — Progress Notes (Addendum)
1 Day Post-Op   Subjective/Chief Complaint: Awake, alert, s/p removal of teeth   Objective: Vital signs in last 24 hours: Temp:  [97.1 F (36.2 C)-98.9 F (37.2 C)] 98.5 F (36.9 C) (03/30 0439) Pulse Rate:  [51-96] 51 (03/30 0800) Resp:  [17-22] 17 (03/30 0800) BP: (93-142)/(39-114) 100/40 (03/30 0800) SpO2:  [88 %-100 %] 99 % (03/30 0800) Last BM Date: 11/25/17  Intake/Output from previous day: 03/29 0701 - 03/30 0700 In: 469.4 [I.V.:469.4] Out: 1100 [Urine:1050; Blood:50] Intake/Output this shift: No intake/output data recorded.  General appearance: alert and cooperative Head: facial swelling/ecchymosis  Resp: clear to auscultation bilaterally Cardio: RRR GI: soft, non-tender; bowel sounds normal Neurologic: Alert and oriented X 3, normal strength and tone.   Lab Results:  Recent Labs    11/26/17 1633 11/27/17 0332  WBC 18.0* 14.2*  HGB 13.2 12.6  HCT 38.7 38.1  PLT 235 242   BMET Recent Labs    11/26/17 1633 11/27/17 0332  NA 127* 131*  K 3.7 4.4  CL 92* 96*  CO2 23 25  GLUCOSE 161* 155*  BUN 32* 31*  CREATININE 1.21* 1.30*  CALCIUM 9.1 8.6*   PT/INR Recent Labs    11/26/17 1633  LABPROT 13.7  INR 1.06   ABG No results for input(s): PHART, HCO3 in the last 72 hours.  Invalid input(s): PCO2, PO2  Studies/Results: Dg Orthopantogram  Result Date: 11/27/2017 CLINICAL DATA:  Fall down steps with face bruising. EXAM: ORTHOPANTOGRAM/PANORAMIC COMPARISON:  Maxillofacial CT from yesterday FINDINGS: Central alveolar ridge fracture of the maxilla is obscured by artifact. Traumatic dislocation of right tooth 7. The other central incisors are located. Notable periapical erosion around the remaining right lower molar. Negative for mandibular fracture or dislocation. IMPRESSION: 1. Known central maxilla alveolar ridge fracture and incisor root loosening that is not well demonstrated on this study. Traumatic extraction of tooth 7 on preceding CT. 2. Located  and intact mandible. Electronically Signed   By: Monte Fantasia M.D.   On: 11/27/2017 13:40   Dg Shoulder Right  Result Date: 11/27/2017 CLINICAL DATA:  Fall.  Soreness both shoulders. EXAM: RIGHT SHOULDER - 2+ VIEW COMPARISON:  No recent. FINDINGS: Acromioclavicular glenohumeral degenerative change. No evidence of fracture dislocation. Diffuse osteopenia. IMPRESSION: Acromioclavicular glenohumeral degenerative change. No acute abnormality. No evidence of fracture or dislocation. Electronically Signed   By: Marcello Moores  Register   On: 11/27/2017 13:35   Dg Shoulder Left  Result Date: 11/27/2017 CLINICAL DATA:  Fall down steps with soreness to both shoulders. EXAM: LEFT SHOULDER - 2+ VIEW COMPARISON:  None. FINDINGS: Negative for acute fracture. Advanced glenohumeral osteoarthritis with joint narrowing and bulky inferior spurring. Osteopenia. IMPRESSION: 1. No acute finding. 2. Advanced glenohumeral osteoarthritis. Electronically Signed   By: Monte Fantasia M.D.   On: 11/27/2017 13:35    Anti-infectives: Anti-infectives (From admission, onward)   Start     Dose/Rate Route Frequency Ordered Stop   11/28/17 0915  ceFAZolin (ANCEF) IVPB 2g/100 mL premix     2 g 200 mL/hr over 30 Minutes Intravenous To ShortStay Surgical 11/28/17 0903 11/28/17 1021      Assessment/Plan: Ground level Fall TBI stable (moderate volume SAH)   CT repeat ok OOB. F/u Dr Ronnald Ramp in 2-3 wks post dc C4 and C5 spinous process fractures- soft collar in place for comfort R carotid stenosis- Incidental finding "Severe atherosclerosis with suspected RIGHT carotid artery hemodynamically significant stenosis which could be confirmed with CTA NECK on nonemergent basis", can follow up with [place]  also or do early this week Facial fractures- open anterior maxilla including alveolar ridge with teeth 7,8,9 avulsion fx; right nasal bone fx and nasal septum fx, facial contusions and right anterior facial hematoma: seen & lip lac closed by ENT  Dr. Constance Holster; s/p removal of multiple teeth by Dr Hoyt Koch yesterday DVT-Continue SCD DM- will start ssi and restart oral meds when tol diet     Rolm Bookbinder 11/29/2017

## 2017-11-29 NOTE — Progress Notes (Signed)
Rehab Admissions Coordinator Note:  Patient was screened by Retta Diones for appropriateness for an Inpatient Acute Rehab Consult.  At this time, we are recommending Inpatient Rehab consult.  Jodell Cipro M 11/29/2017, 8:59 AM  I can be reached at [phone].

## 2017-11-29 NOTE — Progress Notes (Signed)
Sandy Owens PROGRESS NOTE:   SUBJECTIVE: resting well, no discomfort. Tolerating p's  OBJECTIVE:  Vitals: Blood pressure (!) 100/40, pulse (!) 51, temperature 97.7 F (36.5 C), temperature source Axillary, resp. rate 17, height 5' (1.524 m), weight 131 lb 13.4 oz (59.8 kg), SpO2 99 %. Lab results: Results for orders placed or performed during the hospital encounter of 11/26/17 (from the past 24 hour(s))  Glucose, capillary     Status: Abnormal   Collection Time: 11/28/17 10:58 AM  Result Value Ref Range   Glucose-Capillary 148 (H) 65 - 99 mg/dL   Comment 1 Notify RN   Glucose, capillary     Status: Abnormal   Collection Time: 11/28/17 12:12 PM  Result Value Ref Range   Glucose-Capillary 159 (H) 65 - 99 mg/dL  Urinalysis, Routine w reflex microscopic     Status: Abnormal   Collection Time: 11/28/17  2:37 PM  Result Value Ref Range   Color, Urine YELLOW YELLOW   APPearance TURBID (A) CLEAR   Specific Gravity, Urine 1.014 1.005 - 1.030   pH 5.0 5.0 - 8.0   Glucose, UA NEGATIVE NEGATIVE mg/dL   Hgb urine dipstick SMALL (A) NEGATIVE   Bilirubin Urine NEGATIVE NEGATIVE   Ketones, ur NEGATIVE NEGATIVE mg/dL   Protein, ur 100 (A) NEGATIVE mg/dL   Nitrite NEGATIVE NEGATIVE   Leukocytes, UA SMALL (A) NEGATIVE  Urinalysis, Microscopic (reflex)     Status: Abnormal (Preliminary result)   Collection Time: 11/28/17  2:37 PM  Result Value Ref Range   RBC / HPF 6-30 0 - 5 RBC/hpf   WBC, UA TOO NUMEROUS TO COUNT 0 - 5 WBC/hpf   Bacteria, UA FEW (A) NONE SEEN   Squamous Epithelial / LPF TOO NUMEROUS TO COUNT (A) NONE SEEN   Urine-Other PENDING   Glucose, capillary     Status: Abnormal   Collection Time: 11/28/17  4:46 PM  Result Value Ref Range   Glucose-Capillary 235 (H) 65 - 99 mg/dL  Glucose, capillary     Status: Abnormal   Collection Time: 11/28/17  9:52 PM  Result Value Ref Range   Glucose-Capillary 182 (H) 65 - 99 mg/dL  Glucose, capillary     Status: Abnormal   Collection Time: 11/29/17  8:46 AM  Result Value Ref Range   Glucose-Capillary 130 (H) 65 - 99 mg/dL   Radiology Results: Dg Orthopantogram  Result Date: 11/27/2017 CLINICAL DATA:  Fall down steps with face bruising. EXAM: ORTHOPANTOGRAM/PANORAMIC COMPARISON:  Maxillofacial CT from yesterday FINDINGS: Central alveolar ridge fracture of the maxilla is obscured by artifact. Traumatic dislocation of right tooth 7. The other central incisors are located. Notable periapical erosion around the remaining right lower molar. Negative for mandibular fracture or dislocation. IMPRESSION: 1. Known central maxilla alveolar ridge fracture and incisor root loosening that is not well demonstrated on this study. Traumatic extraction of tooth 7 on preceding CT. 2. Located and intact mandible. Electronically Signed   By: Monte Fantasia M.D.   On: 11/27/2017 13:40   Dg Shoulder Right  Result Date: 11/27/2017 CLINICAL DATA:  Fall.  Soreness both shoulders. EXAM: RIGHT SHOULDER - 2+ VIEW COMPARISON:  No recent. FINDINGS: Acromioclavicular glenohumeral degenerative change. No evidence of fracture dislocation. Diffuse osteopenia. IMPRESSION: Acromioclavicular glenohumeral degenerative change. No acute abnormality. No evidence of fracture or dislocation. Electronically Signed   By: Marcello Moores  Register   On: 11/27/2017 13:35   Dg Shoulder Left  Result Date: 11/27/2017 CLINICAL DATA:  Fall down steps with soreness to  both shoulders. EXAM: LEFT SHOULDER - 2+ VIEW COMPARISON:  None. FINDINGS: Negative for acute fracture. Advanced glenohumeral osteoarthritis with joint narrowing and bulky inferior spurring. Osteopenia. IMPRESSION: 1. No acute finding. 2. Advanced glenohumeral osteoarthritis. Electronically Signed   By: Monte Fantasia M.D.   On: 11/27/2017 13:35   General appearance: alert, cooperative and no distress Head: Normocephalic, without obvious abnormality, atraumatic Eyes: negative, edema decreasing Throat: oral  hemostatic, mild edema. pharynx clear.  Neck: no adenopathy and supple, symmetrical, trachea midline  ASSESSMENT: Doing well post-op.   PLAN: Advance diet as tolerated. Will see patient after discharge in 1-2 weeks.   Diona Browner 11/29/2017

## 2017-11-30 ENCOUNTER — Inpatient Hospital Stay (HOSPITAL_COMMUNITY): Payer: Medicare HMO

## 2017-11-30 LAB — URINALYSIS, ROUTINE W REFLEX MICROSCOPIC
Bilirubin Urine: NEGATIVE
Glucose, UA: NEGATIVE mg/dL
Ketones, ur: 5 mg/dL — AB
Nitrite: NEGATIVE
Protein, ur: 30 mg/dL — AB
Specific Gravity, Urine: 1.016 (ref 1.005–1.030)
pH: 5 (ref 5.0–8.0)

## 2017-11-30 LAB — GLUCOSE, CAPILLARY
Glucose-Capillary: 148 mg/dL — ABNORMAL HIGH (ref 65–99)
Glucose-Capillary: 199 mg/dL — ABNORMAL HIGH (ref 65–99)
Glucose-Capillary: 200 mg/dL — ABNORMAL HIGH (ref 65–99)

## 2017-11-30 LAB — URINE CULTURE
Culture: >=100000 — AB
Special Requests: NORMAL

## 2017-11-30 MED ORDER — COLCHICINE 0.6 MG PO TABS
0.6000 mg | ORAL_TABLET | Freq: Every day | ORAL | Status: DC
  Administered 2017-11-30 – 2017-12-01 (×2): 0.6 mg via ORAL
  Filled 2017-11-30 (×2): qty 1

## 2017-11-30 NOTE — Progress Notes (Signed)
1700 pt up to side of bed to dangle, sat up for about 30 minutes and stretched her arms and legs. Feet are still hurting to bad for her to stand at this time. She sat and dangled 3 times today. She has a non-productive cough, however sitting her up does make her deep breathe and cough well.

## 2017-11-30 NOTE — Progress Notes (Signed)
2 Days Post-Op   Subjective/Chief Complaint: C/o stiffness this AM.  Also complains of right hand pain, primarily second digit.     Objective: Vital signs in last 24 hours: Temp:  [97.8 F (36.6 C)-99.4 F (37.4 C)] 99.4 F (37.4 C) (03/31 0300) Pulse Rate:  [54-67] 56 (03/31 0600) Resp:  [17-23] 18 (03/31 0600) BP: (95-121)/(32-66) 96/36 (03/31 0600) SpO2:  [89 %-97 %] 95 % (03/31 0600) Last BM Date: 11/25/17  Intake/Output from previous day: 03/30 0701 - 03/31 0700 In: -  Out: 450 [Urine:450] Intake/Output this shift: Total I/O In: -  Out: 100 [Urine:100]  General appearance: alert and cooperative Head: facial swelling/ecchymosis, lip suture.   Resp: clear to auscultation bilaterally, decreased at bases.   Ortho, swollen right second MTP joint.   Cardio: RRR GI: soft, non-tender; bowel sounds normal Neurologic: Alert and oriented X 3, normal strength and tone.   Lab Results:  No results for input(s): WBC, HGB, HCT, PLT in the last 72 hours. BMET No results for input(s): NA, K, CL, CO2, GLUCOSE, BUN, CREATININE, CALCIUM in the last 72 hours. PT/INR No results for input(s): LABPROT, INR in the last 72 hours. ABG No results for input(s): PHART, HCO3 in the last 72 hours.  Invalid input(s): PCO2, PO2  Studies/Results: No results found.  Anti-infectives: Anti-infectives (From admission, onward)   Start     Dose/Rate Route Frequency Ordered Stop   11/28/17 0915  ceFAZolin (ANCEF) IVPB 2g/100 mL premix     2 g 200 mL/hr over 30 Minutes Intravenous To ShortStay Surgical 11/28/17 0903 11/28/17 1021      Assessment/Plan: Ground level Fall TBI stable (moderate volume SAH)   CT repeat ok OOB. F/u Dr Ronnald Ramp in 2-3 wks post dc C4 and C5 spinous process fractures- soft collar in place for comfort R carotid stenosis- Incidental finding "Severe atherosclerosis with suspected RIGHT carotid artery hemodynamically significant stenosis which could be confirmed with CTA NECK  on nonemergent basis", can follow up with [place] also or do early this week Facial fractures- open anterior maxilla including alveolar ridge with teeth 7,8,9 avulsion fx; right nasal bone fx and nasal septum fx, facial contusions and right anterior facial hematoma: seen & lip lac closed by ENT Dr. Constance Holster; s/p removal of multiple teeth by Dr Hoyt Koch yesterday Advance diet D/c foley OOB, ambulate CXR to eval for hypoxia IS Add colchicine for diet.   DVT-Continue SCD DM- will start ssi and restart oral meds when tol diet     Sandy Owens 11/30/2017

## 2017-11-30 NOTE — Evaluation (Signed)
Occupational Therapy Evaluation Patient Details Name: Sandy Owens MRN: [id number] DOB: 10-20-1923 Today's Date: 11/30/2017    History of Present Illness 84 y.o. female admitted on 11/26/17 for fall with resultant moderate volume SAH, C4/5 spinous process fractures (soft collar), R carotid stenosis, facial fxs (open anterior maxilla including alveolar ridge with teetch 7,8,9 avulsion fx, R nasal bone fx, nasal septum fx, facial contusions, and right anterior facial hematoma, multiple lip lacs s/p closure) and oral surgery on 11/28/17  extraction of teeth # 8, 10, 11, 13, open reduction alvolar fx, enamloplasty teeth # 20 -28.  Pt with significant PMH of neuromuscular d/o, HTN, DM, CAD, L breast CA s/p L mastectomy, L TKA, CABG, and back surgery.   Clinical Impression   Pt admitted with above. She demonstrates the below listed deficits and will benefit from continued OT to maximize safety and independence with BADLs.  Pt lethargic this date, and with c/o pain head/neck, Rt side, bil hands, bil. Legs and feet.  She max to total A for ADLs, and required total A for bed mobility, mod A - progressing to min guard assist for EOB sitting, was unable to stand with total A.   She lived alone PTA, and was independent with ADLs.   Family is supportive.  Her goals are to return to living independently.  Feel she would benefit from the consistency and intensity of CIR to allow her to maximize independence, reduce risk of readmission.  Will follow acutely.       Follow Up Recommendations  CIR;Supervision/Assistance - 24 hour    Equipment Recommendations  None recommended by OT    Recommendations for Other Services Rehab consult     Precautions / Restrictions Precautions Precautions: Fall      Mobility Bed Mobility Overal bed mobility: Needs Assistance Bed Mobility: Supine to Sit;Sit to Supine     Supine to sit: Total assist;HOB elevated Sit to supine: Total assist;HOB elevated   General bed  mobility comments: Pt assisted very little - required assist with all aspects   Transfers Overall transfer level: Needs assistance   Transfers: Sit to/from Stand Sit to Stand: Total assist         General transfer comment: attempted to stand x 2, however pt unable to do so - collapses forward, fully leaning into therapist, and does not attempt further     Balance Overall balance assessment: Needs assistance Sitting-balance support: Feet supported;Bilateral upper extremity supported;No upper extremity supported Sitting balance-Leahy Scale: Poor Sitting balance - Comments: required mod A for 75% of session, progressed to min guard for last portion of session  Postural control: Posterior lean;Right lateral lean                                 ADL either performed or assessed with clinical judgement   ADL Overall ADL's : Needs assistance/impaired Eating/Feeding: Maximal assistance;Sitting   Grooming: Wash/dry hands;Wash/dry face;Brushing hair;Maximal assistance;Sitting   Upper Body Bathing: Total assistance;Sitting   Lower Body Bathing: Total assistance;Bed level   Upper Body Dressing : Total assistance;Bed level   Lower Body Dressing: Total assistance;Bed level   Toilet Transfer: Total assistance Toilet Transfer Details (indicate cue type and reason): unable Toileting- Clothing Manipulation and Hygiene: Total assistance;Bed level       Functional mobility during ADLs: Total assistance General ADL Comments: Pt limited by lethargy and pain      Vision Baseline Vision/History: Wears glasses Wears  Glasses: At all times Patient Visual Report: Other (comment) Additional Comments: Pt with dysconjugate gaze.  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  Pt will open eyes only briefly and reports difficulty keeping Rt eye open due to edema      Perception Perception Perception Tested?: No Comments: difficult to assess due to  lethargy    Praxis Praxis Praxis tested?: Within functional limits;Not tested Praxis-Other Comments: difficult to assess due to lethargy     Pertinent Vitals/Pain Pain Assessment: Faces Faces Pain Scale: Hurts whole lot Pain Location: everywhere - c/o head/neck, face, rt side, Rt UE, feet, legs, Hands Pain Descriptors / Indicators: Grimacing;Guarding Pain Intervention(s): Monitored during session;Limited activity within patient's tolerance;Repositioned     Hand Dominance Right   Extremity/Trunk Assessment Upper Extremity Assessment Upper Extremity Assessment: LUE deficits/detail;Generalized weakness LUE Deficits / Details: Pt with active shoulder flexion to ~80* (she reports questionable difficulty with Lt shoulder PTA, but inconsistent with info).  AAROM WFL    Lower Extremity Assessment Lower Extremity Assessment: Defer to PT evaluation   Cervical / Trunk Assessment Cervical / Trunk Assessment: Other exceptions Cervical / Trunk Exceptions: soft cervial brace    Communication Communication Communication: No difficulties   Cognition Arousal/Alertness: Lethargic Behavior During Therapy: Flat affect Overall Cognitive Status: Impaired/Different from baseline Area of Impairment: Attention;Memory;Following commands;Awareness;Problem solving                   Current Attention Level: Sustained Memory: Decreased short-term memory Following Commands: Follows one step commands consistently;Follows one step commands with increased time   Awareness: Intellectual Problem Solving: Slow processing;Decreased initiation;Difficulty sequencing;Requires verbal cues;Requires tactile cues General Comments: Pt is lethargic today.  She is slow to respond    General Comments  son present     Exercises     Shoulder Instructions      Home Living Family/patient expects to be discharged to:: Inpatient rehab Living Arrangements: Alone Available Help at Discharge: Family;Available  PRN/intermittently Type of Home: House       Home Layout: Laundry or work area in basement;Multi-level   Alternate Level Stairs-Rails: Right                      Prior Functioning/Environment Level of Independence: Independent        Comments: reports she does not drive due to poor vision at baseline, she does not normally walk with an AD, she manages her bills with help from her daughter and she uses a pill box for her meds.         OT Problem List: Decreased strength;Decreased range of motion;Decreased activity tolerance;Impaired balance (sitting and/or standing);Impaired vision/perception;Decreased cognition;Decreased safety awareness;Decreased knowledge of use of DME or AE;Pain      OT Treatment/Interventions: Self-care/ADL training;Therapeutic exercise;Energy conservation;DME and/or AE instruction;Therapeutic activities;Cognitive remediation/compensation;Patient/family education;Balance training;Visual/perceptual remediation/compensation    OT Goals(Current goals can be found in the care plan section) Acute Rehab OT Goals Patient Stated Goal: to return to independence  OT Goal Formulation: With patient/family Time For Goal Achievement: 12/14/17 Potential to Achieve Goals: Good ADL Goals Pt Will Perform Eating: with modified independence;sitting Pt Will Perform Grooming: with min assist;standing Pt Will Perform Upper Body Bathing: with min assist;sitting Pt Will Perform Lower Body Bathing: with mod assist;sit to/from stand Pt Will Perform Upper Body Dressing: with min assist;sitting Pt Will Perform Lower Body Dressing: with mod assist;sit to/from stand Pt Will Transfer to Toilet: with min assist;ambulating;regular height toilet;grab  bars Pt Will Perform Toileting - Clothing Manipulation and hygiene: with min assist;sit to/from stand  OT Frequency: Min 2X/week   Barriers to D/C: Decreased caregiver support  lives alone, but family supportive         Co-evaluation              AM-PAC PT "6 Clicks" Daily Activity     Outcome Measure Help from another person eating meals?: A Lot Help from another person taking care of personal grooming?: A Lot Help from another person toileting, which includes using toliet, bedpan, or urinal?: Total Help from another person bathing (including washing, rinsing, drying)?: Total Help from another person to put on and taking off regular upper body clothing?: Total Help from another person to put on and taking off regular lower body clothing?: Total 6 Click Score: 8   End of Session Equipment Utilized During Treatment: Gait belt;Oxygen  Activity Tolerance: Patient limited by pain Patient left: in bed;with call bell/phone within reach;with bed alarm set;with family/visitor present  OT Visit Diagnosis: Pain;Unsteadiness on feet (R26.81);Cognitive communication deficit (R41.841) Pain - part of body: (generalized )                Time: 9323-5573 OT Time Calculation (min): 37 min Charges:  OT General Charges $OT Visit: 1 Visit OT Evaluation $OT Eval Moderate Complexity: 1 Mod OT Treatments $Therapeutic Activity: 8-22 mins G-Codes:     [facility], OTR/L [phone]   Lucille Passy M 11/30/2017, 12:46 PM

## 2017-11-30 NOTE — Progress Notes (Signed)
Attempted @ 0930 to get patient OOB to chair. When attempted to stand, she said that her feet hurt to bad to do it. She sat on the edge of the bed and dangled for 15 minutes and was put back into bed. Will try again at lunch.

## 2017-12-01 ENCOUNTER — Inpatient Hospital Stay (HOSPITAL_COMMUNITY): Payer: Medicare HMO

## 2017-12-01 ENCOUNTER — Encounter (HOSPITAL_COMMUNITY): Payer: Self-pay | Admitting: Oral Surgery

## 2017-12-01 DIAGNOSIS — E119 Type 2 diabetes mellitus without complications: Secondary | ICD-10-CM

## 2017-12-01 DIAGNOSIS — D62 Acute posthemorrhagic anemia: Secondary | ICD-10-CM

## 2017-12-01 DIAGNOSIS — N183 Chronic kidney disease, stage 3 unspecified: Secondary | ICD-10-CM

## 2017-12-01 DIAGNOSIS — T07XXXA Unspecified multiple injuries, initial encounter: Secondary | ICD-10-CM

## 2017-12-01 DIAGNOSIS — I25810 Atherosclerosis of coronary artery bypass graft(s) without angina pectoris: Secondary | ICD-10-CM

## 2017-12-01 DIAGNOSIS — M10071 Idiopathic gout, right ankle and foot: Secondary | ICD-10-CM

## 2017-12-01 DIAGNOSIS — W19XXXD Unspecified fall, subsequent encounter: Secondary | ICD-10-CM

## 2017-12-01 DIAGNOSIS — E1142 Type 2 diabetes mellitus with diabetic polyneuropathy: Secondary | ICD-10-CM

## 2017-12-01 DIAGNOSIS — W19XXXA Unspecified fall, initial encounter: Secondary | ICD-10-CM

## 2017-12-01 DIAGNOSIS — R52 Pain, unspecified: Secondary | ICD-10-CM

## 2017-12-01 DIAGNOSIS — Z853 Personal history of malignant neoplasm of breast: Secondary | ICD-10-CM

## 2017-12-01 DIAGNOSIS — D72829 Elevated white blood cell count, unspecified: Secondary | ICD-10-CM

## 2017-12-01 DIAGNOSIS — R1312 Dysphagia, oropharyngeal phase: Secondary | ICD-10-CM

## 2017-12-01 DIAGNOSIS — S01511A Laceration without foreign body of lip, initial encounter: Secondary | ICD-10-CM

## 2017-12-01 DIAGNOSIS — I609 Nontraumatic subarachnoid hemorrhage, unspecified: Secondary | ICD-10-CM

## 2017-12-01 DIAGNOSIS — R1310 Dysphagia, unspecified: Secondary | ICD-10-CM

## 2017-12-01 DIAGNOSIS — G8929 Other chronic pain: Secondary | ICD-10-CM

## 2017-12-01 DIAGNOSIS — I10 Essential (primary) hypertension: Secondary | ICD-10-CM

## 2017-12-01 DIAGNOSIS — R0902 Hypoxemia: Secondary | ICD-10-CM

## 2017-12-01 LAB — CBC
HCT: 28.1 % — ABNORMAL LOW (ref 36.0–46.0)
Hemoglobin: 9.3 g/dL — ABNORMAL LOW (ref 12.0–15.0)
MCH: 31.7 pg (ref 26.0–34.0)
MCHC: 33.1 g/dL (ref 30.0–36.0)
MCV: 95.9 fL (ref 78.0–100.0)
Platelets: 230 10*3/uL (ref 150–400)
RBC: 2.93 MIL/uL — ABNORMAL LOW (ref 3.87–5.11)
RDW: 13.8 % (ref 11.5–15.5)
WBC: 14.7 10*3/uL — ABNORMAL HIGH (ref 4.0–10.5)

## 2017-12-01 LAB — BASIC METABOLIC PANEL
Anion gap: 10 (ref 5–15)
BUN: 18 mg/dL (ref 6–20)
CO2: 21 mmol/L — ABNORMAL LOW (ref 22–32)
Calcium: 8 mg/dL — ABNORMAL LOW (ref 8.9–10.3)
Chloride: 101 mmol/L (ref 101–111)
Creatinine, Ser: 1.13 mg/dL — ABNORMAL HIGH (ref 0.44–1.00)
GFR calc Af Amer: 47 mL/min — ABNORMAL LOW (ref >60)
GFR calc non Af Amer: 41 mL/min — ABNORMAL LOW (ref >60)
Glucose, Bld: 134 mg/dL — ABNORMAL HIGH (ref 65–99)
Potassium: 4.2 mmol/L (ref 3.5–5.1)
Sodium: 132 mmol/L — ABNORMAL LOW (ref 135–145)

## 2017-12-01 LAB — GLUCOSE, CAPILLARY
Glucose-Capillary: 127 mg/dL — ABNORMAL HIGH (ref 65–99)
Glucose-Capillary: 233 mg/dL — ABNORMAL HIGH (ref 65–99)
Glucose-Capillary: 138 mg/dL — ABNORMAL HIGH (ref 65–99)
Glucose-Capillary: 127 mg/dL — ABNORMAL HIGH (ref 65–99)

## 2017-12-01 MED ORDER — GABAPENTIN 100 MG PO CAPS
100.0000 mg | ORAL_CAPSULE | Freq: Two times a day (BID) | ORAL | Status: DC
Start: 2017-12-01 — End: 2017-12-03
  Administered 2017-12-01 – 2017-12-03 (×5): 100 mg via ORAL
  Filled 2017-12-01 (×5): qty 1

## 2017-12-01 MED ORDER — ENOXAPARIN SODIUM 40 MG/0.4ML ~~LOC~~ SOLN
40.0000 mg | SUBCUTANEOUS | Status: DC
  Administered 2017-12-01 – 2017-12-02 (×2): 40 mg via SUBCUTANEOUS
  Filled 2017-12-01 (×2): qty 0.4

## 2017-12-01 MED ORDER — BETHANECHOL CHLORIDE 10 MG PO TABS
5.0000 mg | ORAL_TABLET | Freq: Three times a day (TID) | ORAL | Status: DC
  Administered 2017-12-01 – 2017-12-03 (×8): 5 mg via ORAL
  Filled 2017-12-01 (×8): qty 1

## 2017-12-01 NOTE — Progress Notes (Signed)
I met with pt and son at bedside to discuss goals and expectations of an inpt rehab admit. He and his sister can provide 24/7 supervision at d/c They prefer an inpt rehab admit rather than SNF. I will begin insurance authorization with [facility] for possible admit. [phone]

## 2017-12-01 NOTE — Consult Note (Signed)
Physical Medicine and Rehabilitation Consult Reason for Consult: Decreased functional mobility Referring Physician: Trauma services   HPI: Sandy Owens is a 84 y.o. right-handed female with history of left breast cancer with mastectomy, CAD with CABG 2001, hypertension, diabetes mellitus.  Presented 11/26/2017 after ground-level fall.  Per chart review and daughter, patient lives alone.  Multilevel home.  Reports she does not drive due to poor vision.  She manages her own bills and finances.  She has a daughter in the area that checks on her regularly.  There is a son from out of town who plans to provide assistance as needed.  Cranial CT reviewed, showing SAH.  Per report, Cervical spine and maxillofacial films revealed acute moderate volume subarachnoid hemorrhage.  Acute open fracture through anterior maxilla including alveolar ridge with teeth 7, 8 and 9 avulsion fractures.  Acute right nasal bone, osseous nasal septum fracture.  Facial contusions and a 4 x 4 x 2.5 cm right anterior facial hematoma.  Acute nondisplaced C4 and C5 spinous process fractures.  Conservative care of traumatic subarachnoid hemorrhage.  Underwent extraction of multiple teeth 11/28/2017.  Soft collar for C4 and C5 spinous process fractures for comfort.  Incidental findings of right carotid stenosis await plan for CTA of neck.  Hospital course pain management.  Maintain on a soft diet.  Follow cranial CT scan of the head 11/27/2017 stable.  Daughter notes patient recently received pain meds.  She also believes patient's medical and functional condition are declining.  Therapy evaluations completed with recommendations of physical medicine rehab consult.   Review of Systems  Constitutional: Negative for diaphoresis and fever.  HENT: Negative for hearing loss.   Eyes: Positive for blurred vision.  Respiratory: Negative for cough and shortness of breath.   Cardiovascular: Negative for chest pain and palpitations.    Gastrointestinal: Positive for constipation. Negative for nausea.  Genitourinary: Negative for dysuria, flank pain and hematuria.  Musculoskeletal: Positive for falls, joint pain and myalgias.  Neurological: Negative for seizures.  All other systems reviewed and are negative.  Past Medical History:  Diagnosis Date  . Cancer [facility])    left breast  . Coronary artery disease    CABG - 3- 2001  . Diabetes mellitus without complication ([place])   . Hypertension   . Hypothyroidism   . Neuromuscular disorder ([place])    neuropathy in feet  and legs   Past Surgical History:  Procedure Laterality Date  . ABDOMINAL HYSTERECTOMY    . APPENDECTOMY    . BACK SURGERY    . cabg    . HIP SURGERY      right hip replacement  . JOINT REPLACEMENT     left knee replacement  . MASTECTOMY Left   . SIMPLE MASTECTOMY WITH AXILLARY SENTINEL NODE BIOPSY Left 06/06/2014   Procedure: LEFT SIMPLE MASTECTOMY;  Surgeon: Excell Seltzer, MD;  Location: [place];  Service: General;  Laterality: Left;   Family History  Problem Relation Age of Onset  . Breast cancer Neg Hx    Social History:  reports that she has quit smoking. She has never used smokeless tobacco. She reports that she drank alcohol. She reports that she does not use drugs. Allergies: No Known Allergies Medications Prior to Admission  Medication Sig Dispense Refill  . amLODipine (NORVASC) 5 MG tablet 5 mg daily.     Marland Kitchen aspirin 81 MG tablet Take 81 mg by mouth daily.    Marland Kitchen atenolol (TENORMIN) 50  MG tablet 50 mg 2 (two) times daily.     . Calcium Carbonate-Vit D-Min (GNP CALCIUM PLUS 600 +D PO) Take 1 tablet by mouth.    . colchicine 0.6 MG tablet Take 0.6 mg by mouth daily.    Marland Kitchen glimepiride (AMARYL) 1 MG tablet 1 mg daily with breakfast.     . levothyroxine (SYNTHROID, LEVOTHROID) 88 MCG tablet 88 mcg daily.     Marland Kitchen lisinopril (PRINIVIL,ZESTRIL) 5 MG tablet 5 mg daily.     . Multiple Vitamin (MULTIVITAMIN) tablet Take 1 tablet by  mouth daily.    . naproxen sodium (ALEVE) 220 MG tablet Take 2 tablets every 12 hours until gout flare resolves.    . Omega-3 Fatty Acids (FISH OIL PO) Take 1 tablet by mouth 2 (two) times daily.    Marland Kitchen triamterene-hydrochlorothiazide (MAXZIDE-25) 37.5-25 MG per tablet daily.     Marland Kitchen ADVOCATE REDI-CODE test strip     . HYDROcodone-acetaminophen (NORCO/VICODIN) 5-325 MG per tablet Take 1-2 tablets by mouth every 4 (four) hours as needed for moderate pain. (Patient not taking: Reported on 11/26/2017) 40 tablet 0  . traMADol (ULTRAM) 50 MG tablet Take 1 tablet (50 mg total) by mouth every 6 (six) hours as needed. (Patient not taking: Reported on 11/26/2017) 6 tablet 0  . UNABLE TO FIND Med Name: Left Mastectomy Supplies  Diagnosis:  C50.912, Z90.12 (Patient not taking: Reported on 11/26/2017) 1 each 3    Home: Home Living Family/patient expects to be discharged to:: Inpatient rehab Living Arrangements: Alone Available Help at Discharge: Family, Available PRN/intermittently Type of Home: House Home Layout: Laundry or work area in basement, Designer, industrial/product of Steps: 14 Alternate Level Stairs-Rails: Right  Functional History: Prior Function Level of Independence: Independent Comments: reports she does not drive due to poor vision at baseline, she does not normally walk with an AD, she manages her bills with help from her daughter and she uses a pill box for her meds.  Functional Status:  Mobility: Bed Mobility Overal bed mobility: Needs Assistance Bed Mobility: Supine to Sit, Sit to Supine Rolling: Min assist Supine to sit: Total assist, HOB elevated Sit to supine: Total assist, HOB elevated General bed mobility comments: Pt assisted very little - required assist with all aspects  Transfers Overall transfer level: Needs assistance Equipment used: 1 person hand held assist Transfers: Sit to/from Stand Sit to Stand: Total assist General transfer comment: attempted to  stand x 2, however pt unable to do so - collapses forward, fully leaning into therapist, and does not attempt further  Ambulation/Gait Ambulation/Gait assistance: Min assist Ambulation Distance (Feet): 5 Feet Assistive device: 1 person hand held assist Gait Pattern/deviations: Step-to pattern General Gait Details: side step to [facility] to be in a better position to return to supine.  Pt did not want to get up OOB to chair yet.     ADL: ADL Overall ADL's : Needs assistance/impaired Eating/Feeding: Maximal assistance, Sitting Grooming: Wash/dry hands, Wash/dry face, Brushing hair, Maximal assistance, Sitting Upper Body Bathing: Total assistance, Sitting Lower Body Bathing: Total assistance, Bed level Upper Body Dressing : Total assistance, Bed level Lower Body Dressing: Total assistance, Bed level Toilet Transfer: Total assistance Toilet Transfer Details (indicate cue type and reason): unable Toileting- Clothing Manipulation and Hygiene: Total assistance, Bed level Functional mobility during ADLs: Total assistance General ADL Comments: Pt limited by lethargy and pain   Cognition: Cognition Overall Cognitive Status: Impaired/Different from baseline Orientation Level: Oriented X4 Cognition Arousal/Alertness: Lethargic Behavior During  Therapy: Flat affect Overall Cognitive Status: Impaired/Different from baseline Area of Impairment: Attention, Memory, Following commands, Awareness, Problem solving Current Attention Level: Sustained Memory: Decreased short-term memory Following Commands: Follows one step commands consistently, Follows one step commands with increased time Awareness: Intellectual Problem Solving: Slow processing, Decreased initiation, Difficulty sequencing, Requires verbal cues, Requires tactile cues General Comments: Pt is lethargic today.  She is slow to respond   Blood pressure (!) 129/46, pulse 65, temperature 97.9 F (36.6 C), temperature source Oral, resp. rate (!)  25, height 5' (1.524 m), weight 59.8 kg (131 lb 13.4 oz), SpO2 97 %. Physical Exam  Vitals reviewed. Constitutional: She appears well-developed and well-nourished.  HENT:  Multiple bruises to the face with swelling and ecchymosis  Eyes: EOM are normal. Right eye exhibits no discharge. Left eye exhibits no discharge.  Pupils sluggish but reactive to light  Neck:  Soft cervical collar in place  Cardiovascular: Normal rate and regular rhythm.  Respiratory:  Limited inspiratory effort but clear to auscultation +[place]  GI: Soft. Bowel sounds are normal. She exhibits no distension.  Musculoskeletal:  Facial edema and tenderness Tenderness b/l feet  Neurological: She is alert.  She provides her name and age.   Some mild delay in processing.   Follows simple commands Motor: RUE: 4-/5 proximal to distal LUE: 3-/5 proximal to distal LLE: 3-/5 proximal to distal RLE: 3-/5 proximal to distal  Skin:  Healing abrasions  Psychiatric: Her affect is blunt. Her speech is delayed. She is slowed.    Results for orders placed or performed during the hospital encounter of 11/26/17 (from the past 24 hour(s))  Glucose, capillary     Status: Abnormal   Collection Time: 11/30/17 11:44 AM  Result Value Ref Range   Glucose-Capillary 148 (H) 65 - 99 mg/dL  Urinalysis, Routine w reflex microscopic     Status: Abnormal   Collection Time: 11/30/17  1:02 PM  Result Value Ref Range   Color, Urine YELLOW YELLOW   APPearance HAZY (A) CLEAR   Specific Gravity, Urine 1.016 1.005 - 1.030   pH 5.0 5.0 - 8.0   Glucose, UA NEGATIVE NEGATIVE mg/dL   Hgb urine dipstick SMALL (A) NEGATIVE   Bilirubin Urine NEGATIVE NEGATIVE   Ketones, ur 5 (A) NEGATIVE mg/dL   Protein, ur 30 (A) NEGATIVE mg/dL   Nitrite NEGATIVE NEGATIVE   Leukocytes, UA LARGE (A) NEGATIVE   RBC / HPF 6-30 0 - 5 RBC/hpf   WBC, UA TOO NUMEROUS TO COUNT 0 - 5 WBC/hpf   Bacteria, UA RARE (A) NONE SEEN   Squamous Epithelial / LPF 0-5 (A) NONE SEEN     Mucus PRESENT    Non Squamous Epithelial 0-5 (A) NONE SEEN  Glucose, capillary     Status: Abnormal   Collection Time: 11/30/17  5:54 PM  Result Value Ref Range   Glucose-Capillary 199 (H) 65 - 99 mg/dL  Glucose, capillary     Status: Abnormal   Collection Time: 11/30/17  8:53 PM  Result Value Ref Range   Glucose-Capillary 200 (H) 65 - 99 mg/dL  CBC     Status: Abnormal   Collection Time: 12/01/17  3:43 AM  Result Value Ref Range   WBC 14.7 (H) 4.0 - 10.5 K/uL   RBC 2.93 (L) 3.87 - 5.11 MIL/uL   Hemoglobin 9.3 (L) 12.0 - 15.0 g/dL   HCT 28.1 (L) 36.0 - 46.0 %   MCV 95.9 78.0 - 100.0 fL   MCH 31.7 26.0 -  34.0 pg   MCHC 33.1 30.0 - 36.0 g/dL   RDW 13.8 11.5 - 15.5 %   Platelets 230 150 - 400 K/uL  Basic metabolic panel     Status: Abnormal   Collection Time: 12/01/17  3:43 AM  Result Value Ref Range   Sodium 132 (L) 135 - 145 mmol/L   Potassium 4.2 3.5 - 5.1 mmol/L   Chloride 101 101 - 111 mmol/L   CO2 21 (L) 22 - 32 mmol/L   Glucose, Bld 134 (H) 65 - 99 mg/dL   BUN 18 6 - 20 mg/dL   Creatinine, Ser 1.13 (H) 0.44 - 1.00 mg/dL   Calcium 8.0 (L) 8.9 - 10.3 mg/dL   GFR calc non Af Amer 41 (L) >60 mL/min   GFR calc Af Amer 47 (L) >60 mL/min   Anion gap 10 5 - 15   Dg Chest 2 View  Result Date: 11/30/2017 CLINICAL DATA:  Hypoxia. EXAM: CHEST - 2 VIEW COMPARISON:  06/03/2007 FINDINGS: Low volume chest which accentuates borderline heart size. Low volumes with streaky density especially behind the heart. No Kerley lines, effusion, or pneumothorax. Status post CABG. IMPRESSION: Low volume chest with left lower lobe atelectasis or pneumonia. Electronically Signed   By: Monte Fantasia M.D.   On: 11/30/2017 12:04    Assessment/Plan: Diagnosis: Polytrauma with TBI Labs and images independently reviewed.  Records reviewed and summated above.  1. Does the need for close, 24 hr/day medical supervision in concert with the patient's rehab needs make it unreasonable for this patient to  be served in a less intensive setting? Yes  2. Co-Morbidities requiring supervision/potential complications: left breast cancer with mastectomy, CAD with CABG (cont meds), HTN (monitor and provide prns in accordance with increased physical exertion and pain), diabetes mellitus with peripheral neuropathy (Monitor in accordance with exercise and adjust meds as necessary), gout (?flare, monitor LE), pain management (Biofeedback training with therapies to help reduce reliance on opiate pain medications, monitor pain control during therapies, and sedation at rest and titrate to maximum efficacy to ensure participation and gains in therapies), dysphagia (advance diet as tolerated), tachypnea (monitor RR and O2 Sats with increased physical exertion), hypothyroidism (cont meds, ensure appropriate mood and energy level for therapies), hyponatremia (cont to monitor, treat if necessary), leukocytosis (cont to monitor for signs and symptoms of infection, further workup if indicated), ABLA (transfuse if necessary to ensure appropriate perfusion for increased activity tolerance), CKD (avoid nephrotoxic meds) 3. Due to safety, skin/wound care, disease management, pain management and patient education, does the patient require 24 hr/day rehab nursing? Yes 4. Does the patient require coordinated care of a physician, rehab nurse, PT (1-2 hrs/day, 5 days/week), OT (1-2 hrs/day, 5 days/week) and SLP (1-2 hrs/day, 5 days/week) to address physical and functional deficits in the context of the above medical diagnosis(es)? Yes Addressing deficits in the following areas: balance, endurance, locomotion, strength, transferring, bathing, dressing, toileting, speech, swallowing and psychosocial support 5. Can the patient actively participate in an intensive therapy program of at least 3 hrs of therapy per day at least 5 days per week? Potentially 6. The potential for patient to make measurable gains while on inpatient rehab is  excellent 7. Anticipated functional outcomes upon discharge from inpatient rehab are supervision  with PT, supervision with OT, modified independent and supervision with SLP. 8. Estimated rehab length of stay to reach the above functional goals is: 12-17 days. 9. Anticipated D/C setting: Home 10. Anticipated post D/C treatments: HH therapy and  Home excercise program 11. Overall Rehab/Functional Prognosis: excellent  RECOMMENDATIONS: This patient's condition is appropriate for continued rehabilitative care in the following setting: CIR once medical workup complete if caregiver support available upon discharge. Patient has agreed to participate in recommended program. Potentially Note that insurance prior authorization may be required for reimbursement for recommended care.  Comment: Rehab Admissions Coordinator to follow up.  Delice Lesch, MD, ABPMR Lavon Paganini Angiulli, PA-C 12/01/2017

## 2017-12-01 NOTE — Progress Notes (Signed)
[facility]/Trauma Progress Note  3 Days Post-Op   Assessment/Plan Ground levelFall TBIstable (moderate volume SAH)CT repeat ok OOB. F/u Dr Ronnald Ramp in 2-3 wks post dc C4 and C5 spinous process fractures- soft collar in place for comfort R carotid stenosis- Incidental finding "Severe atherosclerosis with suspected RIGHT carotid artery hemodynamically significant stenosis which could be confirmed with CTA NECK on nonemergent basis", can follow up with [place] also or do early this week Facial fractures-open anterior maxilla including alveolar ridge with teeth 7,8,9 avulsion fx; right nasal bone fx and nasal septum fx, facial contusions and right anterior facial hematoma:seen& lip lac closedby ENTDr. Constance Holster; s/p removal of multiple teeth by Dr Hoyt Koch  New O2 requirement - 2L [place] and sats around 95-96, pulling 550 on IS - chest xray 03/31 showed low volume chest with LLL atelectasis or PNA,   Urinary retention - foley replaced last night, started urecholine Foot pain - likely neuropathy or from swelling from third spacing, dc'd fluids - started gabapentin B/l wrist/hand pain - xrays pending  DM- SSI FEN: pureed diet, colchicine for diet VTE: SCD's, lovenox held from [facility] ID: Ancef pre-op, afebrile, WBC 14.7 Foley: yes due to urinary retention, started urecholine, voiding trial tomorrow, creatinine improving Follow up: TBD  DISPO: OOB, ambulate, dc fluids, xrays for b/l wrists/hands pending, elevate feet, gabapentin for foot pain, PT/OT pending    LOS: 5 days    Subjective: CC: facial pain, b/l wrist and hand pain, and b/l feet pain  Pt states foot pain is a burning sensation and daughter states pt has neuropathy but does not take anything for it. Pt states this feels different than her normal foot pain that Vicks vapor rub helps. Pt complaining of swelling of b/l hands/wrist with some bruising of palmar aspect of right hand. Daughter is concerned about pt complaining of  worsening blurred vision. Pt blind in left eye. Pt has glasses. I asked daughter to bring in glasses to better assess vision before calling ophthalmology. Urinary retention yesterday and foley placed. Pt is not able to chew soft diet well due to multiple missing teeth. Pt states no issues breathing. No chest pain, abdominal pain, fevers or chills.   Objective: Vital signs in last 24 hours: Temp:  [97.9 F (36.6 C)-99.7 F (37.6 C)] 97.9 F (36.6 C) (04/01 0715) Pulse Rate:  [61-77] 64 (04/01 0700) Resp:  [20-33] 24 (04/01 0700) BP: (123-144)/(45-56) 125/46 (04/01 0700) SpO2:  [90 %-99 %] 96 % (04/01 0700) Last BM Date: 11/25/17  Intake/Output from previous day: 03/31 0701 - 04/01 0700 In: 3141.8 [P.O.:780; I.V.:2361.8] Out: 975 [Urine:975] Intake/Output this shift: No intake/output data recorded.  PE: Gen:  Alert, NAD, pleasant, cooperative HEENT: facial swelling and ecchymosis, lip suture and dried blood, pupils are equal and round, unable to assess vision without glasses Chest: ecchymosis noted to upper chest likely from pooling from face Card:  RRR, no M/G/R heard Pulm:  Mild bibasilar rales, rate and effort normal, pulling 550 on IS Abd: Soft, NT/ND, +BS Extremities: BUE: edema to b/l hands and forearms with ecchymosis noted to palmar aspect of right hand, pain with supination and pronation, poor ROM of b/l wrists, BLE: 1+ pitting edema of feet and mild edema up to knees, pain with light touch of feet, no ecchymosis or deformities noted Skin: no rashes noted, warm and dry   Anti-infectives: Anti-infectives (From admission, onward)   Start     Dose/Rate Route Frequency Ordered Stop   11/28/17 0915  ceFAZolin (ANCEF)  IVPB 2g/100 mL premix     2 g 200 mL/hr over 30 Minutes Intravenous To [facility] 11/28/17 0903 11/28/17 1021      Lab Results:  Recent Labs    12/01/17 0343  WBC 14.7*  HGB 9.3*  HCT 28.1*  PLT 230   BMET Recent Labs    12/01/17 0343  NA  132*  K 4.2  CL 101  CO2 21*  GLUCOSE 134*  BUN 18  CREATININE 1.13*  CALCIUM 8.0*   PT/INR No results for input(s): LABPROT, INR in the last 72 hours. CMP     Component Value Date/Time   NA 132 (L) 12/01/2017 0343   K 4.2 12/01/2017 0343   CL 101 12/01/2017 0343   CO2 21 (L) 12/01/2017 0343   GLUCOSE 134 (H) 12/01/2017 0343   BUN 18 12/01/2017 0343   CREATININE 1.13 (H) 12/01/2017 0343   CALCIUM 8.0 (L) 12/01/2017 0343   PROT 6.0 (L) 11/26/2017 1633   ALBUMIN 2.9 (L) 11/26/2017 1633   AST 55 (H) 11/26/2017 1633   ALT 32 11/26/2017 1633   ALKPHOS 66 11/26/2017 1633   BILITOT 0.8 11/26/2017 1633   GFRNONAA 41 (L) 12/01/2017 0343   GFRAA 47 (L) 12/01/2017 0343   Lipase  No results found for: LIPASE  Studies/Results: Dg Chest 2 View  Result Date: 11/30/2017 CLINICAL DATA:  Hypoxia. EXAM: CHEST - 2 VIEW COMPARISON:  06/03/2007 FINDINGS: Low volume chest which accentuates borderline heart size. Low volumes with streaky density especially behind the heart. No Kerley lines, effusion, or pneumothorax. Status post CABG. IMPRESSION: Low volume chest with left lower lobe atelectasis or pneumonia. Electronically Signed   By: Monte Fantasia M.D.   On: 11/30/2017 12:04      Kalman Drape , [facility] 12/01/2017, 9:32 AM  Pager: [phone] Mon-Wed, Friday 7:00am-4:30pm Thurs 7am-11:30am

## 2017-12-01 NOTE — Progress Notes (Signed)
I await insurance approval for admit to CIR, Insurance MD feels pt's pain is limiting her ability to fully participate at this time. I discussed with Janett Billow, [place]. I have requested further therapy tomorrow to see her ability to progress. [phone]

## 2017-12-01 NOTE — Care Management Important Message (Signed)
Important Message  Patient Details  Name: DARIELA STOKER MRN: [id number] Date of Birth: [birth year]   Medicare Important Message Given:  Yes    Lanika Colgate 12/01/2017, 3:00 PM

## 2017-12-01 NOTE — Progress Notes (Signed)
Physical Therapy Treatment Patient Details Name: Sandy Owens MRN: [id number] DOB: 12/18/1923 Today's Date: 12/01/2017    History of Present Illness 84 y.o. female admitted on 11/26/17 for fall with resultant moderate volume SAH, C4/5 spinous process fractures (soft collar), R carotid stenosis, facial fxs (open anterior maxilla including alveolar ridge with teetch 7,8,9 avulsion fx, R nasal bone fx, nasal septum fx, facial contusions, and right anterior facial hematoma, multiple lip lacs s/p closure) and oral surgery on 11/28/17  extraction of teeth # 8, 10, 11, 13, open reduction alvolar fx, enamloplasty teeth # 20 -28.  Pt with significant PMH of neuromuscular d/o, HTN, DM, CAD, L breast CA s/p L mastectomy, L TKA, CABG, and back surgery.    PT Comments    Pt progressing towards physical therapy goals. Was able to demonstrate improved bed mobility and advanced her LE's towards the EOB with less assist. Bed pad utilized for scooting assist (laterally bed>drop arm chair). She was attempting to assist with scooting activity however was not able to get leverage due to pain in her feel and in L wrist. Pt appears to be motivated to work with therapy and return to her PLOF. Pt and family inquiring about the process for CIR admission. Therapist explained typical process and reinforced that pt would need to be able to tolerate 3 hours of therapy per day to be considered for CIR. With improved pain control in the feet feel she could tolerate increased activity during therapy session. Will continue to follow and progress as able per POC.   Follow Up Recommendations  CIR     Equipment Recommendations  Rolling walker with 5" wheels    Recommendations for Other Services Rehab consult     Precautions / Restrictions Precautions Precautions: Fall Precaution Comments: Low vision at baseline Restrictions Weight Bearing Restrictions: No    Mobility  Bed Mobility Overal bed mobility: Needs  Assistance Bed Mobility: Supine to Sit     Supine to sit: Mod assist;+2 for physical assistance;HOB elevated     General bed mobility comments: Pt was able to advance her LE's towards the EOB. Bed pad was used to scoot around and +2 provided for trunk elevation to full sitting position EOB.   Transfers Overall transfer level: Needs assistance Equipment used: 2 person hand held assist Transfers: Sit to/from Stand;Lateral/Scoot Transfers Sit to Stand: Total assist;+2 physical assistance        Lateral/Scoot Transfers: Mod assist;+2 physical assistance General transfer comment: Attempted sit<>stand EOB with +2 assist. Due to pain in feet, pt unable to tolerate putting weight through her LE's and asks to stop. Was able to tolerate lateral transfer to drop arm recliner with +2 mod assist and bed pad for support.   Ambulation/Gait                 Stairs            Wheelchair Mobility    Modified Rankin (Stroke Patients Only)       Balance Overall balance assessment: Needs assistance Sitting-balance support: Feet supported;Bilateral upper extremity supported;No upper extremity supported Sitting balance-Leahy Scale: Poor Sitting balance - Comments: Was able to maintain static sitting without therapist assist however unable to accept truncal challenges.  Postural control: Posterior lean;Right lateral lean Standing balance support: Single extremity supported Standing balance-Leahy Scale: Zero Standing balance comment: +2 to attempt  Cognition Arousal/Alertness: Awake/alert Behavior During Therapy: Flat affect Overall Cognitive Status: Impaired/Different from baseline Area of Impairment: Attention;Memory;Following commands;Awareness;Problem solving                   Current Attention Level: Sustained Memory: Decreased short-term memory Following Commands: Follows one step commands consistently;Follows one step commands  with increased time   Awareness: Intellectual Problem Solving: Slow processing;Decreased initiation;Difficulty sequencing;Requires verbal cues;Requires tactile cues General Comments: Overall slow to respond but answering questions appropriately.       Exercises      General Comments        Pertinent Vitals/Pain Pain Assessment: Faces Faces Pain Scale: Hurts whole lot Pain Location: everywhere - c/o head/neck, face, rt side, Rt UE, feet, legs, Hands Pain Descriptors / Indicators: Grimacing;Guarding Pain Intervention(s): Limited activity within patient's tolerance;Monitored during session;Repositioned    Home Living                      Prior Function            PT Goals (current goals can now be found in the care plan section) Acute Rehab PT Goals Patient Stated Goal: to return to independence  PT Goal Formulation: With patient Time For Goal Achievement: 12/12/17 Potential to Achieve Goals: Good Progress towards PT goals: Progressing toward goals    Frequency    Min 3X/week      PT Plan Current plan remains appropriate    Co-evaluation              AM-PAC PT "6 Clicks" Daily Activity  Outcome Measure  Difficulty turning over in bed (including adjusting bedclothes, sheets and blankets)?: Unable Difficulty moving from lying on back to sitting on the side of the bed? : Unable Difficulty sitting down on and standing up from a chair with arms (e.g., wheelchair, bedside commode, etc,.)?: Unable Help needed moving to and from a bed to chair (including a wheelchair)?: A Little Help needed walking in hospital room?: A Little Help needed climbing 3-5 steps with a railing? : A Lot 6 Click Score: 11    End of Session Equipment Utilized During Treatment: Cervical collar;Gait belt Activity Tolerance: Patient limited by pain Patient left: in chair;with call bell/phone within reach;with family/visitor present Nurse Communication: Mobility status;Need for  lift equipment PT Visit Diagnosis: History of falling (Z91.81);Other symptoms and signs involving the nervous system (R29.898);Pain Pain - Right/Left: (neck) Pain - part of body: (neck)     Time: 1771-1657 PT Time Calculation (min) (ACUTE ONLY): 28 min  Charges:  $Gait Training: 23-37 mins                    G Codes:       Rolinda Roan, PT, DPT Acute Rehabilitation Services Pager: [phone]    Thelma Comp 12/01/2017, 11:31 AM

## 2017-12-02 ENCOUNTER — Inpatient Hospital Stay (HOSPITAL_COMMUNITY): Payer: Medicare HMO

## 2017-12-02 LAB — BASIC METABOLIC PANEL
Anion gap: 13 (ref 5–15)
BUN: 18 mg/dL (ref 6–20)
CO2: 21 mmol/L — ABNORMAL LOW (ref 22–32)
Calcium: 8.1 mg/dL — ABNORMAL LOW (ref 8.9–10.3)
Chloride: 100 mmol/L — ABNORMAL LOW (ref 101–111)
Creatinine, Ser: 1.02 mg/dL — ABNORMAL HIGH (ref 0.44–1.00)
GFR calc Af Amer: 54 mL/min — ABNORMAL LOW (ref >60)
GFR calc non Af Amer: 46 mL/min — ABNORMAL LOW (ref >60)
Glucose, Bld: 96 mg/dL (ref 65–99)
Potassium: 3.8 mmol/L (ref 3.5–5.1)
Sodium: 134 mmol/L — ABNORMAL LOW (ref 135–145)

## 2017-12-02 LAB — CBC
HCT: 27.9 % — ABNORMAL LOW (ref 36.0–46.0)
Hemoglobin: 8.9 g/dL — ABNORMAL LOW (ref 12.0–15.0)
MCH: 30.4 pg (ref 26.0–34.0)
MCHC: 31.9 g/dL (ref 30.0–36.0)
MCV: 95.2 fL (ref 78.0–100.0)
Platelets: 227 10*3/uL (ref 150–400)
RBC: 2.93 MIL/uL — ABNORMAL LOW (ref 3.87–5.11)
RDW: 13.6 % (ref 11.5–15.5)
WBC: 12.4 10*3/uL — ABNORMAL HIGH (ref 4.0–10.5)

## 2017-12-02 LAB — GLUCOSE, CAPILLARY
Glucose-Capillary: 107 mg/dL — ABNORMAL HIGH (ref 65–99)
Glucose-Capillary: 143 mg/dL — ABNORMAL HIGH (ref 65–99)
Glucose-Capillary: 191 mg/dL — ABNORMAL HIGH (ref 65–99)
Glucose-Capillary: 140 mg/dL — ABNORMAL HIGH (ref 65–99)

## 2017-12-02 MED ORDER — NAPROXEN SODIUM 275 MG PO TABS
275.0000 mg | ORAL_TABLET | Freq: Two times a day (BID) | ORAL | Status: DC
  Administered 2017-12-02 – 2017-12-03 (×4): 275 mg via ORAL
  Filled 2017-12-02 (×4): qty 1

## 2017-12-02 MED ORDER — ACETAMINOPHEN 325 MG PO TABS
650.0000 mg | ORAL_TABLET | Freq: Four times a day (QID) | ORAL | Status: DC
  Administered 2017-12-02 – 2017-12-03 (×6): 650 mg via ORAL
  Filled 2017-12-02 (×6): qty 2

## 2017-12-02 MED ORDER — COLCHICINE 0.6 MG PO TABS
0.3000 mg | ORAL_TABLET | Freq: Every day | ORAL | Status: DC
  Administered 2017-12-02 – 2017-12-03 (×2): 0.3 mg via ORAL
  Filled 2017-12-02 (×2): qty 0.5

## 2017-12-02 MED ORDER — ASPIRIN EC 81 MG PO TBEC
81.0000 mg | DELAYED_RELEASE_TABLET | Freq: Every day | ORAL | Status: DC
  Administered 2017-12-02 – 2017-12-03 (×2): 81 mg via ORAL
  Filled 2017-12-02 (×4): qty 1

## 2017-12-02 MED ORDER — OXYCODONE HCL 5 MG PO TABS: 2.5000 mg | ORAL_TABLET | ORAL | Status: DC | PRN

## 2017-12-02 MED ORDER — COLCHICINE 0.6 MG PO TABS: 0.6000 mg | ORAL_TABLET | Freq: Every day | ORAL | Status: DC

## 2017-12-02 NOTE — Progress Notes (Addendum)
Physical Therapy Treatment Patient Details Name: Sandy Owens MRN: [id number] DOB: 1924-07-18 Today's Date: 12/02/2017    History of Present Illness 84 y.o. female admitted on 11/26/17 for fall with resultant moderate volume SAH, C4/5 spinous process fractures (soft collar), R carotid stenosis, facial fxs (open anterior maxilla including alveolar ridge with teetch 7,8,9 avulsion fx, R nasal bone fx, nasal septum fx, facial contusions, and right anterior facial hematoma, multiple lip lacs s/p closure) and oral surgery on 11/28/17  extraction of teeth # 8, 10, 11, 13, open reduction alvolar fx, enamloplasty teeth # 20 -28.  Pt with significant PMH of neuromuscular d/o, HTN, DM, CAD, L breast CA s/p L mastectomy, L TKA, CABG, and back surgery.    PT Comments    Pt progressing towards physical therapy goals. Was able to progress ambulation this session and achieved ~10 feet with +2 min assist. Pain was significantly improved with socks and shoes vs hospital socks (pt could feel the non-slip texture in weightbearing which was increasing her pain). Will continue to progress distance as tolerance for functional activity appears to be improving.   Of note, during rolling activity to the right pt reports dizziness, and therapist observed ageotropic horizontal nystagmus lasting ~30 seconds in duration. Will further assess for vestibular dysfunction next session.   Pt remains appropriate for CIR level rehab at d/c.   Follow Up Recommendations  CIR     Equipment Recommendations  Rolling walker with 5" wheels    Recommendations for Other Services Rehab consult     Precautions / Restrictions Precautions Precautions: Fall Precaution Comments: Low vision at baseline Restrictions Weight Bearing Restrictions: No    Mobility  Bed Mobility Overal bed mobility: Needs Assistance Bed Mobility: Sit to Supine;Rolling Rolling: Min guard     Sit to supine: Min assist;+2 for safety/equipment    General bed mobility comments: Assist for LE elevation to bed as well as trunk control to supine position. Pt was able to press through feet on bed to scoot herself up in the bed (therapist assisting with bed pad minimally)  Transfers Overall transfer level: Needs assistance Equipment used: 2 person hand held assist Transfers: Sit to/from [facility] Sit to Stand: Mod assist;+2 physical assistance Stand pivot transfers: Mod assist;+2 physical assistance       General transfer comment: VC's for hand placement on seated surface for safety. Pt was able to power-up to full stand with +2 mod assist for balance support and safety.   Ambulation/Gait Ambulation/Gait assistance: Min assist;+2 physical assistance Ambulation Distance (Feet): 10 Feet Assistive device: 2 person hand held assist Gait Pattern/deviations: Decreased stride length;Shuffle;Trunk flexed;Narrow base of support Gait velocity: Decreased Gait velocity interpretation: Below normal speed for age/gender General Gait Details: With shoes donned, pt was able to ambulate in room ~10 feet. Feel she could have gone farther but the room set-up limited distance. Heavy +2 min assist for weight shifting and balance support.    Stairs            Wheelchair Mobility    Modified Rankin (Stroke Patients Only)       Balance Overall balance assessment: Needs assistance Sitting-balance support: Feet supported;Bilateral upper extremity supported;No upper extremity supported Sitting balance-Leahy Scale: Fair Sitting balance - Comments: Able to maintain sitting at EOB during self feeding Postural control: Posterior lean;Right lateral lean Standing balance support: Bilateral upper extremity supported Standing balance-Leahy Scale: Poor  Cognition Arousal/Alertness: Awake/alert Behavior During Therapy: Flat affect Overall Cognitive Status: Impaired/Different from  baseline Area of Impairment: Attention;Memory;Following commands;Awareness;Problem solving                   Current Attention Level: Selective Memory: Decreased short-term memory Following Commands: Follows one step commands consistently;Follows one step commands with increased time;Follows multi-step commands inconsistently   Awareness: Emergent Problem Solving: Slow processing;Requires verbal cues General Comments: Pt responding to questions and commands. Requiring increased time and slow moving due to generalized pain.       Exercises      General Comments        Pertinent Vitals/Pain Pain Assessment: Faces Faces Pain Scale: Hurts whole lot Pain Location: BLEs - feet (left>right) Pain Descriptors / Indicators: Grimacing;Guarding;Moaning;Tightness Pain Intervention(s): Monitored during session    Home Living                      Prior Function            PT Goals (current goals can now be found in the care plan section) Acute Rehab PT Goals Patient Stated Goal: to return to independence  PT Goal Formulation: With patient Time For Goal Achievement: 12/12/17 Potential to Achieve Goals: Good Progress towards PT goals: Progressing toward goals    Frequency    Min 3X/week      PT Plan Current plan remains appropriate    Co-evaluation              AM-PAC PT "6 Clicks" Daily Activity  Outcome Measure  Difficulty turning over in bed (including adjusting bedclothes, sheets and blankets)?: Unable Difficulty moving from lying on back to sitting on the side of the bed? : Unable Difficulty sitting down on and standing up from a chair with arms (e.g., wheelchair, bedside commode, etc,.)?: Unable Help needed moving to and from a bed to chair (including a wheelchair)?: A Lot Help needed walking in hospital room?: A Lot Help needed climbing 3-5 steps with a railing? : Total 6 Click Score: 8    End of Session Equipment Utilized During Treatment:  Cervical collar;Gait belt Activity Tolerance: Patient limited by pain Patient left: in chair;with call bell/phone within reach;with family/visitor present Nurse Communication: Mobility status;Need for lift equipment PT Visit Diagnosis: History of falling (Z91.81);Other symptoms and signs involving the nervous system (R29.898);Pain Pain - Right/Left: (neck) Pain - part of body: (neck)     Time: 1340-1409 PT Time Calculation (min) (ACUTE ONLY): 29 min  Charges:  $Gait Training: 23-37 mins                    G Codes:       Rolinda Roan, PT, DPT Acute Rehabilitation Services Pager: [phone]    Thelma Comp 12/02/2017, 2:56 PM

## 2017-12-02 NOTE — Care Management Note (Signed)
Case Management Note  Patient Details  Name: Sandy Owens MRN: [id number] Date of Birth: [birth year]  Subjective/Objective:  Pt admitted on 11/26/17 s/p ground level fall with TBI, multiple facial and oral fractures, and cervical spine transverse process fractures.  PTA, pt very independent; lives at home alone.  She has supportive daughter at bedside.                    Action/Plan: PT/OT consults pending.  CSW and RN Case Manager offered support to pt/ family.  Oral surgeon planning surgical intervention pending neuro clearance.  Will follow progress.    Expected Discharge Date:                  Expected Discharge Plan:  IP Rehab Facility  In-House Referral:  Clinical Social Work  Discharge planning Services  CM Consult  Post Acute Care Choice:    Choice offered to:     DME Arranged:    DME Agency:     HH Arranged:    [place] Agency:     Status of Service:  In process, will continue to follow  If discussed at Long Length of Stay Meetings, dates discussed:    Additional Comments:  12/02/17 J. Oren Section, RN, BSN PT/OT recommending CIR.  Pt with significant pain in feet, limiting ambulation.  This may possibly be related to gout flare; added gout meds today.  Hopeful for improved progress.    Reinaldo Raddle, RN, BSN  Trauma/Neuro ICU Case Manager [phone]

## 2017-12-02 NOTE — Progress Notes (Signed)
Occupational Therapy Treatment Patient Details Name: Sandy Owens MRN: [id number] DOB: July 24, 1924 Today's Date: 12/02/2017    History of present illness 84 y.o. female admitted on 11/26/17 for fall with resultant moderate volume SAH, C4/5 spinous process fractures (soft collar), R carotid stenosis, facial fxs (open anterior maxilla including alveolar ridge with teetch 7,8,9 avulsion fx, R nasal bone fx, nasal septum fx, facial contusions, and right anterior facial hematoma, multiple lip lacs s/p closure) and oral surgery on 11/28/17  extraction of teeth # 8, 10, 11, 13, open reduction alvolar fx, enamloplasty teeth # 20 -28.  Pt with significant PMH of neuromuscular d/o, HTN, DM, CAD, L breast CA s/p L mastectomy, L TKA, CABG, and back surgery.   OT comments  Pt progressing towards established goals. Pt performing self feeding and washing her face at EOB with set up and supervision-Min Guard A. Demonstrating increased sitting balance at EOB. Pt performing stand pivot transfer with Max A +2. Pt highly motivated to return to PLOF and participate in therapy and performed transfer despite significant pain in BLEs (feet). Pt performing BUE/BLE exercises seated and provided education to pt and family on edema management.  Continue to recommend dc to CIR for intensive OT and feel pt is motivated and would make good progress to independence for return home. Will continue to follow acutely to facilitate safe dc.    Follow Up Recommendations  CIR;Supervision/Assistance - 24 hour    Equipment Recommendations  None recommended by OT    Recommendations for Other Services Rehab consult    Precautions / Restrictions Precautions Precautions: Fall Precaution Comments: Low vision at baseline Restrictions Weight Bearing Restrictions: No       Mobility Bed Mobility Overal bed mobility: Needs Assistance Bed Mobility: Supine to Sit     Supine to sit: HOB elevated;Min assist     General bed mobility  comments: Min A to elevate trunk. Pt bringing BLEs towards EOB. VCs for use of bed rails to bring trunk towards EOB.   Transfers Overall transfer level: Needs assistance Equipment used: 2 person hand held assist Transfers: Sit to/from [facility] Sit to Stand: +2 physical assistance;Max assist Stand pivot transfers: Max assist;+2 physical assistance       General transfer comment: Max A +2 to power up into standing. Requiring bilateral knees blocked to assist in achieving upright postition. Pt requiring Max A +2 to facilitate pivot to recliner and assistance to move LLE laterally.     Balance Overall balance assessment: Needs assistance Sitting-balance support: Feet supported;Bilateral upper extremity supported;No upper extremity supported Sitting balance-Leahy Scale: Fair Sitting balance - Comments: Able to maintain sitting at EOB during self feeding   Standing balance support: Single extremity supported Standing balance-Leahy Scale: Zero Standing balance comment: Requiring Max A +2 to maintain standing                           ADL either performed or assessed with clinical judgement   ADL Overall ADL's : Needs assistance/impaired Eating/Feeding: Sitting;Set up;Supervision/ safety Eating/Feeding Details (indicate cue type and reason): Pt sitting at EOB with supervision for safety. Pt able to hold spoon and bring to mouth, Noted compensatory hiking of shoulder (RUE) to bring spoon to mouth. Pt with poor lip closure when drinking from a straw; providing Min cues for closing lips after each sip of drink through a straw.  Grooming: Wash/dry face;Sitting;Min guard Grooming Details (indicate cue type and reason): Sitting at EOB,  pt washed her face after eating. Min Guard for safety while seated. Demonstrating good sitting balance.             Lower Body Dressing: Maximal assistance;Sit to/from stand Lower Body Dressing Details (indicate cue type and  reason): Max A to don socks  Toilet Transfer: Maximal assistance;+2 for physical assistance Toilet Transfer Details (indicate cue type and reason): Pt performing stand pivot transfer to recliner with Max A +2. Pt motivated to participate in therapy despite significant pain in bilateral feet. Pt requiring Min A to facilitate lateral movement of LLE and has difficulty putting weight thorugh LLE due to pain.          Functional mobility during ADLs: Moderate assistance;+2 for physical assistance(Stand pivot only) General ADL Comments: Pt performing self feeding and grooming at EOB with supervision- [employer]. Demonstrating increased grasp strength and BUE ROM; however, continues to use compensatory hiking to bring hand to mouth for self feeding. Pt wearing her glasses throughout session. Pt performing BLE exercises in prepration for stand pivot to recliner. During stand pivot, pt requiring Max A for standing balacne due to significant pain. Pt highly motivated to return to PLOF. Pt performing activity on roomair and staying in 90s; placing pt back on 2L at end of session due to drop to 88%. HR elevating to 77 duirng transfer     Vision       Perception     Praxis      Cognition Arousal/Alertness: Awake/alert Behavior During Therapy: Flat affect Overall Cognitive Status: Impaired/Different from baseline Area of Impairment: Attention;Memory;Following commands;Awareness;Problem solving                   Current Attention Level: Selective Memory: Decreased short-term memory Following Commands: Follows one step commands consistently;Follows one step commands with increased time   Awareness: Emergent Problem Solving: Slow processing;Requires verbal cues General Comments: Pt responding to questions and commands. Requiring increased time adn slow moving due to generalized pain.         Exercises Exercises: General Upper Extremity;General Lower Extremity;Other exercises General  Exercises - Upper Extremity Shoulder Flexion: AROM;Both;10 reps(Adhering to cervical precautions) Elbow Flexion: AROM;Both;10 reps;Seated Elbow Extension: AROM;Both;10 reps;Seated Wrist Flexion: AROM;Both;10 reps;Seated Wrist Extension: AROM;Both;10 reps;Seated Digit Composite Flexion: AROM;Both;10 reps;Seated Composite Extension: AROM;Both;10 reps;Seated General Exercises - Lower Extremity Ankle Circles/Pumps: AAROM;Both;10 reps;Seated Hip Flexion/Marching: AROM;Both;10 reps;Seated Other Exercises Other Exercises: Educating pt and family on edema mangement with elevation and ROM Other Exercises: Finger opposition; BUEs; 5 sets; demonstrating poor opposition and requring increased time   Shoulder Instructions       General Comments Daughter and son present throughout session    Pertinent Vitals/ Pain       Pain Assessment: Faces Faces Pain Scale: Hurts whole lot Pain Location: BLEs - feet (left>right) Pain Descriptors / Indicators: Grimacing;Guarding;Moaning;Tightness Pain Intervention(s): Monitored during session;Limited activity within patient's tolerance;Repositioned  Home Living                                          Prior Functioning/Environment              Frequency  Min 2X/week        Progress Toward Goals  OT Goals(current goals can now be found in the care plan section)  Progress towards OT goals: Progressing toward goals  Acute Rehab OT Goals Patient Stated Goal: to return  to independence  OT Goal Formulation: With patient/family Time For Goal Achievement: 12/14/17 Potential to Achieve Goals: Good ADL Goals Pt Will Perform Eating: with modified independence;sitting Pt Will Perform Grooming: with min assist;standing Pt Will Perform Upper Body Bathing: with min assist;sitting Pt Will Perform Lower Body Bathing: with mod assist;sit to/from stand Pt Will Perform Upper Body Dressing: with min assist;sitting Pt Will Perform Lower  Body Dressing: with mod assist;sit to/from stand Pt Will Transfer to Toilet: with min assist;ambulating;regular height toilet;grab bars Pt Will Perform Toileting - Clothing Manipulation and hygiene: with min assist;sit to/from stand  Plan Discharge plan remains appropriate    Co-evaluation                 AM-PAC PT "6 Clicks" Daily Activity     Outcome Measure   Help from another person eating meals?: A Little Help from another person taking care of personal grooming?: A Little Help from another person toileting, which includes using toliet, bedpan, or urinal?: A Lot Help from another person bathing (including washing, rinsing, drying)?: A Lot Help from another person to put on and taking off regular upper body clothing?: A Lot Help from another person to put on and taking off regular lower body clothing?: A Lot 6 Click Score: 14    End of Session Equipment Utilized During Treatment: Gait belt;Oxygen  OT Visit Diagnosis: Pain;Unsteadiness on feet (R26.81);Cognitive communication deficit (R41.841) Pain - part of body: Ankle and joints of foot;Leg(generalized; BLEs)   Activity Tolerance Patient tolerated treatment well   Patient Left with call bell/phone within reach;with family/visitor present;in chair   Nurse Communication Mobility status        Time: 6789-3810 OT Time Calculation (min): 34 min  Charges: OT General Charges $OT Visit: 1 Visit OT Treatments $Self Care/Home Management : 23-37 mins  [place], OTR/L Acute Rehab Pager: [phone] Office: [place] 12/02/2017, 10:45 AM

## 2017-12-02 NOTE — Progress Notes (Signed)
[facility]/Trauma Progress Note  4 Days Post-Op   Assessment/Plan Ground levelFall TBIstable (moderate volume SAH)CT repeat ok OOB. F/u Dr Ronnald Ramp in 2-3 wks post dc C4 and C5 spinous process fractures- soft collar in place for comfort R carotid stenosis- Incidental finding "Severe atherosclerosis with suspected RIGHT carotid artery hemodynamically significant stenosis which could be confirmed with CTA NECK on nonemergent basis", can follow up with [place] also or do early this week Facial fractures-open anterior maxilla including alveolar ridge with teeth 7,8,9 avulsion fx; right nasal bone fx and nasal septum fx, facial contusions and right anterior facial hematoma:seen& lip lac closedby ENTDr. Constance Holster; s/p removal of multiple teeth by Dr Hoyt Koch  New O2 requirement - 2L [place] and sats around 95-96, pulling 550 on IS - chest xray 03/31 showed low volume chest with LLL atelectasis or PNA,  Urinary retention - foley replaced 03/31, started urecholine - voiding trial today Foot pain  - started gabapentin, not really helping - could be gout, on colchicine and started Aleve B/l wrist/hand pain - xrays neg for fracture, lucency of R index finger and ortho reviewed films and it's likely from gout, MRI only if pain and pt is not complaining of pain in that finger  DM- SSI FEN: pureed diet, colchicine for diet VTE: SCD's, lovenox  ID: Ancef pre-op, afebrile, WBC 14.7 Foley: voiding trial today, creatinine improving Follow up: TBD  DISPO: OOB, ambulate, added aleve for possible gout in feet, PT/OT pending, chest xray pending     LOS: 6 days    Subjective: CC: facial pain, b/l wrist and hand pain, and b/l feet pain  Daughter states hx of gout and gout started in R index finger roughly 5 years ago. Pt takes colchicine daily for suppressive therapy. Foot pain feels different than neuropathy. Daughter brought glasses in and pt thinks her vision is roughly the same as  baseline. Febrile last evening to 100.4. No SOB, abdominal pain, nausea or vomiting.   Objective: Vital signs in last 24 hours: Temp:  [97.5 F (36.4 C)-100.4 F (38 C)] 97.5 F (36.4 C) (04/02 0821) Pulse Rate:  [54-66] 63 (04/01 2300) Resp:  [18-31] 22 (04/01 2300) BP: (88-158)/(39-68) 130/47 (04/01 2300) SpO2:  [90 %-99 %] 97 % (04/01 2300) Last BM Date: (PTA)  Intake/Output from previous day: 04/01 0701 - 04/02 0700 In: 180 [P.O.:180] Out: 1100 [Urine:1100] Intake/Output this shift: No intake/output data recorded.  PE: Gen:  Alert, NAD, pleasant, cooperative HEENT: facial swelling and ecchymosis, lip suture and dried blood, pupils are equal and round, unable to assess vision without glasses Chest: ecchymosis noted to upper chest likely from pooling from face Card:  RRR, no M/G/R heard Pulm:  Mild bibasilar rales with diminished breaths sounds on the left, rate and effort normal Abd: Soft, NT/ND, +BS Extremities: BUE: edema to b/l hands and forearms with ecchymosis noted to palmar aspect of right hand, R index finger with deformity at PIP joint, BLE: 1+ pitting edema of feet and mild edema up to knees, pain with light touch of feet, no ecchymosis or deformities noted Skin: no rashes noted, warm and dry   Anti-infectives: Anti-infectives (From admission, onward)   Start     Dose/Rate Route Frequency Ordered Stop   11/28/17 0915  ceFAZolin (ANCEF) IVPB 2g/100 mL premix     2 g 200 mL/hr over 30 Minutes Intravenous To [facility] 11/28/17 0903 11/28/17 1021      Lab Results:  Recent Labs    12/01/17 0343  12/02/17 0411  WBC 14.7* 12.4*  HGB 9.3* 8.9*  HCT 28.1* 27.9*  PLT 230 227   BMET Recent Labs    12/01/17 0343 12/02/17 0411  NA 132* 134*  K 4.2 3.8  CL 101 100*  CO2 21* 21*  GLUCOSE 134* 96  BUN 18 18  CREATININE 1.13* 1.02*  CALCIUM 8.0* 8.1*   PT/INR No results for input(s): LABPROT, INR in the last 72 hours. CMP     Component Value  Date/Time   NA 134 (L) 12/02/2017 0411   K 3.8 12/02/2017 0411   CL 100 (L) 12/02/2017 0411   CO2 21 (L) 12/02/2017 0411   GLUCOSE 96 12/02/2017 0411   BUN 18 12/02/2017 0411   CREATININE 1.02 (H) 12/02/2017 0411   CALCIUM 8.1 (L) 12/02/2017 0411   PROT 6.0 (L) 11/26/2017 1633   ALBUMIN 2.9 (L) 11/26/2017 1633   AST 55 (H) 11/26/2017 1633   ALT 32 11/26/2017 1633   ALKPHOS 66 11/26/2017 1633   BILITOT 0.8 11/26/2017 1633   GFRNONAA 46 (L) 12/02/2017 0411   GFRAA 54 (L) 12/02/2017 0411   Lipase  No results found for: LIPASE  Studies/Results: Dg Chest 2 View  Result Date: 11/30/2017 CLINICAL DATA:  Hypoxia. EXAM: CHEST - 2 VIEW COMPARISON:  06/03/2007 FINDINGS: Low volume chest which accentuates borderline heart size. Low volumes with streaky density especially behind the heart. No Kerley lines, effusion, or pneumothorax. Status post CABG. IMPRESSION: Low volume chest with left lower lobe atelectasis or pneumonia. Electronically Signed   By: Monte Fantasia M.D.   On: 11/30/2017 12:04   Dg Wrist Complete Left  Result Date: 12/01/2017 CLINICAL DATA:  Left wrist pain after fall. EXAM: LEFT WRIST - COMPLETE 3+ VIEW COMPARISON:  None. FINDINGS: There is no evidence of fracture or dislocation. Moderate narrowing and osteophyte formation is seen involving the first carpometacarpal joint. Soft tissues are unremarkable. IMPRESSION: Moderate osteoarthritis of the first carpometacarpal joint. No acute abnormality seen in the left wrist. Electronically Signed   By: Marijo Conception, M.D.   On: 12/01/2017 17:05   Dg Wrist Complete Right  Result Date: 12/01/2017 CLINICAL DATA:  Fall.  Pain. EXAM: RIGHT WRIST - COMPLETE 3+ VIEW COMPARISON:  None. FINDINGS: There is no evidence of fracture or dislocation. There is no evidence of arthropathy or other focal bone abnormality. Soft tissues are unremarkable, except for overlying IV tubing. IMPRESSION: Negative. Electronically Signed   By: Staci Righter M.D.    On: 12/01/2017 17:01   Dg Hand Complete Left  Result Date: 12/01/2017 CLINICAL DATA:  Left hand pain after fall. EXAM: LEFT HAND - COMPLETE 3+ VIEW COMPARISON:  None. FINDINGS: There is no evidence of fracture or dislocation. Vascular calcifications are noted. Moderate narrowing and osteophyte formation is seen involving the first carpometacarpal joint. IMPRESSION: Moderate osteoarthritis of the first carpometacarpal joint. No acute abnormality seen in the left hand. Electronically Signed   By: Marijo Conception, M.D.   On: 12/01/2017 17:08   Dg Hand Complete Right  Result Date: 12/01/2017 CLINICAL DATA:  Bilateral hand pain after falling. EXAM: RIGHT HAND - COMPLETE 3+ VIEW COMPARISON:  Radiographs of February 21, 2013. FINDINGS: There is interval development involving lytic destruction involving the medial aspect of the distal portion of second proximal phalanx and proximal base of second middle phalanx. Lucency is also noted in the distal portion of the second proximal phalanx. This is concerning for possible malignancy or osteomyelitis or some form of erosive arthritis.  MRI is recommended for further evaluation. No other fracture or bony abnormality is noted. IMPRESSION: There is lytic destruction seen involving portions of the second proximal and middle phalanges concerning for osteomyelitis, or erosive arthritis or possibly malignancy. Further evaluation with MRI with and without gadolinium is recommended. Electronically Signed   By: Marijo Conception, M.D.   On: 12/01/2017 17:03      Kalman Drape , [facility] 12/02/2017, 8:51 AM  Pager: [phone] Mon-Wed, Friday 7:00am-4:30pm Thurs 7am-11:30am

## 2017-12-02 NOTE — Progress Notes (Signed)
I have submitted today's PT, OT, and Medical progress notes to [facility] and requested peer to peer with Dr. Hulen Skains for Wednesday. Pt and son are aware. I will follow up tomorrow. I discussed with Trauma PA.  [phone]

## 2017-12-03 ENCOUNTER — Other Ambulatory Visit: Payer: Self-pay

## 2017-12-03 ENCOUNTER — Encounter (HOSPITAL_COMMUNITY): Payer: Self-pay

## 2017-12-03 ENCOUNTER — Inpatient Hospital Stay (HOSPITAL_COMMUNITY)
Admission: RE | Admit: 2017-12-03 | Discharge: 2017-12-13 | DRG: 945 | Disposition: A | Discharge status: Home Health | Payer: Medicare HMO | Source: Intra-hospital | Attending: Physical Medicine & Rehabilitation | Admitting: Physical Medicine & Rehabilitation

## 2017-12-03 DIAGNOSIS — R1312 Dysphagia, oropharyngeal phase: Secondary | ICD-10-CM | POA: Diagnosis not present

## 2017-12-03 DIAGNOSIS — E222 Syndrome of inappropriate secretion of antidiuretic hormone: Secondary | ICD-10-CM | POA: Diagnosis not present

## 2017-12-03 DIAGNOSIS — S12301D Unspecified nondisplaced fracture of fourth cervical vertebra, subsequent encounter for fracture with routine healing: Secondary | ICD-10-CM | POA: Diagnosis not present

## 2017-12-03 DIAGNOSIS — Z8739 Personal history of other diseases of the musculoskeletal system and connective tissue: Secondary | ICD-10-CM

## 2017-12-03 DIAGNOSIS — Z90710 Acquired absence of both cervix and uterus: Secondary | ICD-10-CM | POA: Diagnosis not present

## 2017-12-03 DIAGNOSIS — Z9012 Acquired absence of left breast and nipple: Secondary | ICD-10-CM | POA: Diagnosis not present

## 2017-12-03 DIAGNOSIS — S066X9D Traumatic subarachnoid hemorrhage with loss of consciousness of unspecified duration, subsequent encounter: Secondary | ICD-10-CM | POA: Diagnosis not present

## 2017-12-03 DIAGNOSIS — R531 Weakness: Secondary | ICD-10-CM | POA: Diagnosis not present

## 2017-12-03 DIAGNOSIS — S022XXD Fracture of nasal bones, subsequent encounter for fracture with routine healing: Secondary | ICD-10-CM | POA: Diagnosis not present

## 2017-12-03 DIAGNOSIS — N179 Acute kidney failure, unspecified: Secondary | ICD-10-CM | POA: Diagnosis not present

## 2017-12-03 DIAGNOSIS — N183 Chronic kidney disease, stage 3 unspecified: Secondary | ICD-10-CM | POA: Diagnosis present

## 2017-12-03 DIAGNOSIS — R339 Retention of urine, unspecified: Secondary | ICD-10-CM

## 2017-12-03 DIAGNOSIS — Z96652 Presence of left artificial knee joint: Secondary | ICD-10-CM | POA: Diagnosis present

## 2017-12-03 DIAGNOSIS — R7309 Other abnormal glucose: Secondary | ICD-10-CM | POA: Diagnosis not present

## 2017-12-03 DIAGNOSIS — E1140 Type 2 diabetes mellitus with diabetic neuropathy, unspecified: Secondary | ICD-10-CM | POA: Diagnosis present

## 2017-12-03 DIAGNOSIS — Z96641 Presence of right artificial hip joint: Secondary | ICD-10-CM | POA: Diagnosis present

## 2017-12-03 DIAGNOSIS — I10 Essential (primary) hypertension: Secondary | ICD-10-CM | POA: Diagnosis present

## 2017-12-03 DIAGNOSIS — H547 Unspecified visual loss: Secondary | ICD-10-CM | POA: Diagnosis present

## 2017-12-03 DIAGNOSIS — I69098 Other sequelae following nontraumatic subarachnoid hemorrhage: Secondary | ICD-10-CM | POA: Diagnosis present

## 2017-12-03 DIAGNOSIS — I2510 Atherosclerotic heart disease of native coronary artery without angina pectoris: Secondary | ICD-10-CM | POA: Diagnosis present

## 2017-12-03 DIAGNOSIS — I129 Hypertensive chronic kidney disease with stage 1 through stage 4 chronic kidney disease, or unspecified chronic kidney disease: Secondary | ICD-10-CM | POA: Diagnosis present

## 2017-12-03 DIAGNOSIS — Z853 Personal history of malignant neoplasm of breast: Secondary | ICD-10-CM

## 2017-12-03 DIAGNOSIS — S01511S Laceration without foreign body of lip, sequela: Secondary | ICD-10-CM

## 2017-12-03 DIAGNOSIS — E119 Type 2 diabetes mellitus without complications: Secondary | ICD-10-CM | POA: Diagnosis not present

## 2017-12-03 DIAGNOSIS — S06301D Unspecified focal traumatic brain injury with loss of consciousness of 30 minutes or less, subsequent encounter: Secondary | ICD-10-CM | POA: Diagnosis not present

## 2017-12-03 DIAGNOSIS — E1122 Type 2 diabetes mellitus with diabetic chronic kidney disease: Secondary | ICD-10-CM | POA: Diagnosis present

## 2017-12-03 DIAGNOSIS — E1169 Type 2 diabetes mellitus with other specified complication: Secondary | ICD-10-CM

## 2017-12-03 DIAGNOSIS — Z7982 Long term (current) use of aspirin: Secondary | ICD-10-CM

## 2017-12-03 DIAGNOSIS — Z87891 Personal history of nicotine dependence: Secondary | ICD-10-CM | POA: Diagnosis not present

## 2017-12-03 DIAGNOSIS — E039 Hypothyroidism, unspecified: Secondary | ICD-10-CM

## 2017-12-03 DIAGNOSIS — Z23 Encounter for immunization: Secondary | ICD-10-CM | POA: Diagnosis not present

## 2017-12-03 DIAGNOSIS — Z951 Presence of aortocoronary bypass graft: Secondary | ICD-10-CM

## 2017-12-03 DIAGNOSIS — I609 Nontraumatic subarachnoid hemorrhage, unspecified: Secondary | ICD-10-CM | POA: Diagnosis not present

## 2017-12-03 DIAGNOSIS — E871 Hypo-osmolality and hyponatremia: Secondary | ICD-10-CM | POA: Diagnosis not present

## 2017-12-03 DIAGNOSIS — F419 Anxiety disorder, unspecified: Secondary | ICD-10-CM | POA: Diagnosis present

## 2017-12-03 DIAGNOSIS — Z79890 Hormone replacement therapy (postmenopausal): Secondary | ICD-10-CM

## 2017-12-03 DIAGNOSIS — S12401D Unspecified nondisplaced fracture of fifth cervical vertebra, subsequent encounter for fracture with routine healing: Secondary | ICD-10-CM

## 2017-12-03 DIAGNOSIS — E11649 Type 2 diabetes mellitus with hypoglycemia without coma: Secondary | ICD-10-CM | POA: Diagnosis present

## 2017-12-03 DIAGNOSIS — S06301S Unspecified focal traumatic brain injury with loss of consciousness of 30 minutes or less, sequela: Secondary | ICD-10-CM | POA: Diagnosis not present

## 2017-12-03 DIAGNOSIS — M7989 Other specified soft tissue disorders: Secondary | ICD-10-CM | POA: Diagnosis not present

## 2017-12-03 DIAGNOSIS — E669 Obesity, unspecified: Secondary | ICD-10-CM | POA: Diagnosis not present

## 2017-12-03 DIAGNOSIS — N189 Chronic kidney disease, unspecified: Secondary | ICD-10-CM | POA: Diagnosis not present

## 2017-12-03 DIAGNOSIS — W1830XD Fall on same level, unspecified, subsequent encounter: Secondary | ICD-10-CM

## 2017-12-03 DIAGNOSIS — Z7409 Other reduced mobility: Secondary | ICD-10-CM | POA: Diagnosis not present

## 2017-12-03 LAB — BASIC METABOLIC PANEL
Anion gap: 10 (ref 5–15)
BUN: 18 mg/dL (ref 6–20)
CO2: 24 mmol/L (ref 22–32)
Calcium: 8.4 mg/dL — ABNORMAL LOW (ref 8.9–10.3)
Chloride: 98 mmol/L — ABNORMAL LOW (ref 101–111)
Creatinine, Ser: 1.07 mg/dL — ABNORMAL HIGH (ref 0.44–1.00)
GFR calc Af Amer: 51 mL/min — ABNORMAL LOW (ref >60)
GFR calc non Af Amer: 44 mL/min — ABNORMAL LOW (ref >60)
Glucose, Bld: 144 mg/dL — ABNORMAL HIGH (ref 65–99)
Potassium: 4.1 mmol/L (ref 3.5–5.1)
Sodium: 132 mmol/L — ABNORMAL LOW (ref 135–145)

## 2017-12-03 LAB — CBC
HCT: 30.8 % — ABNORMAL LOW (ref 36.0–46.0)
Hemoglobin: 10.3 g/dL — ABNORMAL LOW (ref 12.0–15.0)
MCH: 31.4 pg (ref 26.0–34.0)
MCHC: 33.4 g/dL (ref 30.0–36.0)
MCV: 93.9 fL (ref 78.0–100.0)
Platelets: 291 10*3/uL (ref 150–400)
RBC: 3.28 MIL/uL — ABNORMAL LOW (ref 3.87–5.11)
RDW: 13.5 % (ref 11.5–15.5)
WBC: 9.9 10*3/uL (ref 4.0–10.5)

## 2017-12-03 LAB — GLUCOSE, CAPILLARY
Glucose-Capillary: 114 mg/dL — ABNORMAL HIGH (ref 65–99)
Glucose-Capillary: 155 mg/dL — ABNORMAL HIGH (ref 65–99)
Glucose-Capillary: 104 mg/dL — ABNORMAL HIGH (ref 65–99)

## 2017-12-03 MED ORDER — ONDANSETRON HCL 4 MG PO TABS: 4.0000 mg | ORAL_TABLET | Freq: Four times a day (QID) | ORAL | Status: DC | PRN

## 2017-12-03 MED ORDER — ASPIRIN EC 81 MG PO TBEC
81.0000 mg | DELAYED_RELEASE_TABLET | Freq: Every day | ORAL | Status: DC
  Administered 2017-12-04 – 2017-12-13 (×10): 81 mg via ORAL
  Filled 2017-12-03 (×10): qty 1

## 2017-12-03 MED ORDER — GABAPENTIN 100 MG PO CAPS
100.0000 mg | ORAL_CAPSULE | Freq: Two times a day (BID) | ORAL | Status: DC
  Administered 2017-12-03 – 2017-12-13 (×20): 100 mg via ORAL
  Filled 2017-12-03 (×20): qty 1

## 2017-12-03 MED ORDER — ONDANSETRON HCL 4 MG/2ML IJ SOLN: 4.0000 mg | Freq: Four times a day (QID) | INTRAMUSCULAR | Status: DC | PRN

## 2017-12-03 MED ORDER — ENOXAPARIN SODIUM 30 MG/0.3ML ~~LOC~~ SOLN
30.0000 mg | SUBCUTANEOUS | Status: DC
  Administered 2017-12-03: 30 mg via SUBCUTANEOUS
  Filled 2017-12-03 (×2): qty 0.3

## 2017-12-03 MED ORDER — BETHANECHOL CHLORIDE 10 MG PO TABS
5.0000 mg | ORAL_TABLET | Freq: Three times a day (TID) | ORAL | Status: DC
  Administered 2017-12-03 – 2017-12-05 (×5): 5 mg via ORAL
  Filled 2017-12-03 (×5): qty 1

## 2017-12-03 MED ORDER — PHENOL 1.4 % MT LIQD: 1.0000 | OROMUCOSAL | Status: DC | PRN

## 2017-12-03 MED ORDER — OXYCODONE HCL 5 MG PO TABS: 2.5000 mg | ORAL_TABLET | ORAL | Status: DC | PRN

## 2017-12-03 MED ORDER — AMLODIPINE BESYLATE 5 MG PO TABS
5.0000 mg | ORAL_TABLET | Freq: Every day | ORAL | Status: DC
  Administered 2017-12-04 – 2017-12-13 (×10): 5 mg via ORAL
  Filled 2017-12-03 (×11): qty 1

## 2017-12-03 MED ORDER — LEVOTHYROXINE SODIUM 88 MCG PO TABS
88.0000 ug | ORAL_TABLET | Freq: Every day | ORAL | Status: DC
  Administered 2017-12-04 – 2017-12-13 (×10): 88 ug via ORAL
  Filled 2017-12-03 (×10): qty 1

## 2017-12-03 MED ORDER — ENOXAPARIN SODIUM 30 MG/0.3ML ~~LOC~~ SOLN
30.0000 mg | SUBCUTANEOUS | Status: DC
  Administered 2017-12-04 – 2017-12-12 (×9): 30 mg via SUBCUTANEOUS
  Filled 2017-12-03 (×9): qty 0.3

## 2017-12-03 MED ORDER — NAPROXEN SODIUM 275 MG PO TABS
275.0000 mg | ORAL_TABLET | Freq: Two times a day (BID) | ORAL | Status: DC
  Administered 2017-12-04 – 2017-12-11 (×16): 275 mg via ORAL
  Filled 2017-12-03 (×16): qty 1

## 2017-12-03 MED ORDER — CHLORHEXIDINE GLUCONATE 0.12 % MT SOLN
15.0000 mL | Freq: Two times a day (BID) | OROMUCOSAL | Status: DC
  Administered 2017-12-03 – 2017-12-12 (×19): 15 mL via OROMUCOSAL
  Filled 2017-12-03 (×19): qty 15

## 2017-12-03 MED ORDER — ATENOLOL 50 MG PO TABS
50.0000 mg | ORAL_TABLET | Freq: Two times a day (BID) | ORAL | Status: DC
  Administered 2017-12-03 – 2017-12-13 (×20): 50 mg via ORAL
  Filled 2017-12-03 (×20): qty 1

## 2017-12-03 MED ORDER — TRAMADOL HCL 50 MG PO TABS: 50.0000 mg | ORAL_TABLET | Freq: Four times a day (QID) | ORAL | Status: DC | PRN

## 2017-12-03 MED ORDER — COLCHICINE 0.6 MG PO TABS
0.3000 mg | ORAL_TABLET | Freq: Every day | ORAL | Status: DC
  Administered 2017-12-04 – 2017-12-13 (×10): 0.3 mg via ORAL
  Filled 2017-12-03 (×11): qty 0.5

## 2017-12-03 MED ORDER — ONDANSETRON 4 MG PO TBDP
4.0000 mg | ORAL_TABLET | Freq: Four times a day (QID) | ORAL | Status: DC | PRN
  Administered 2017-12-11: 4 mg via ORAL
  Filled 2017-12-03 (×2): qty 1

## 2017-12-03 MED ORDER — ACETAMINOPHEN 325 MG PO TABS
325.0000 mg | ORAL_TABLET | ORAL | Status: DC | PRN
  Administered 2017-12-04: 650 mg via ORAL
  Filled 2017-12-03: qty 2

## 2017-12-03 MED ORDER — INSULIN ASPART 100 UNIT/ML ~~LOC~~ SOLN
0.0000 [IU] | Freq: Three times a day (TID) | SUBCUTANEOUS | Status: DC
  Administered 2017-12-05 – 2017-12-07 (×2): 1 [IU] via SUBCUTANEOUS
  Administered 2017-12-07: 2 [IU] via SUBCUTANEOUS
  Administered 2017-12-08: 1 [IU] via SUBCUTANEOUS
  Administered 2017-12-10: 2 [IU] via SUBCUTANEOUS

## 2017-12-03 MED ORDER — LISINOPRIL 5 MG PO TABS
5.0000 mg | ORAL_TABLET | Freq: Every day | ORAL | Status: DC
  Administered 2017-12-04 – 2017-12-13 (×10): 5 mg via ORAL
  Filled 2017-12-03 (×11): qty 1

## 2017-12-03 MED ORDER — SORBITOL 70 % SOLN
30.0000 mL | Freq: Every day | Status: DC | PRN
  Administered 2017-12-04 – 2017-12-12 (×4): 30 mL via ORAL
  Filled 2017-12-03 (×4): qty 30

## 2017-12-03 NOTE — Progress Notes (Signed)
Occupational Therapy Treatment Patient Details Name: Sandy Owens MRN: [id number] DOB: 1924/05/30 Today's Date: 12/03/2017    History of present illness 84 y.o. female admitted on 11/26/17 for fall with resultant moderate volume SAH, C4/5 spinous process fractures (soft collar), R carotid stenosis, facial fxs (open anterior maxilla including alveolar ridge with teetch 7,8,9 avulsion fx, R nasal bone fx, nasal septum fx, facial contusions, and right anterior facial hematoma, multiple lip lacs s/p closure) and oral surgery on 11/28/17  extraction of teeth # 8, 10, 11, 13, open reduction alvolar fx, enamloplasty teeth # 20 -28.  Pt with significant PMH of neuromuscular d/o, HTN, DM, CAD, L breast CA s/p L mastectomy, L TKA, CABG, and back surgery.   OT comments  Pt progressing towards established goals and continues to demonstrating high motivation to participate in therapy. Pt performing grooming tasks at sink with Min A for balance and increased time and cues for processing. Pt requiring Min A and RW for functional mobility to bathroom. Pt requiring rest break at sink due to back pain. Pt and family reports they have been performing UE exercises throughout day. Continue to recommend dc to CIR and will continue to follow acutely as admitted.    Follow Up Recommendations  CIR;Supervision/Assistance - 24 hour    Equipment Recommendations  None recommended by OT    Recommendations for Other Services Rehab consult    Precautions / Restrictions Precautions Precautions: Fall Precaution Comments: Low vision at baseline Restrictions Weight Bearing Restrictions: No       Mobility Bed Mobility Overal bed mobility: Needs Assistance Bed Mobility: Supine to Sit     Supine to sit: HOB elevated;Min assist     General bed mobility comments: OOB upon arrival  Transfers Overall transfer level: Needs assistance Equipment used: Rolling walker (2 wheeled) Transfers: Sit to/from Stand Sit to  Stand: Min assist         General transfer comment: Min A to power up into standing. VCs for hand placement    Balance Overall balance assessment: Needs assistance Sitting-balance support: Feet supported;No upper extremity supported Sitting balance-Leahy Scale: Fair     Standing balance support: During functional activity;Single extremity supported Standing balance-Leahy Scale: Poor Standing balance comment: Reliant on UE support during functional mobility.                            ADL either performed or assessed with clinical judgement   ADL Overall ADL's : Needs assistance/impaired     Grooming: Wash/dry face;Minimal assistance;Standing Grooming Details (indicate cue type and reason): Min A for standing balance. Pt requiring increased time and cues for sequencing. Performing oral care and washing her face and requiring either single hand on sink or hips against sink for balance. Pt stating her back starting to hurt and requesting to sit down.                              Functional mobility during ADLs: Minimal assistance;+2 for safety/equipment;Rolling walker General ADL Comments: Pt highly motivated to participate in therapy and return to PLOF. Pt eating upon arrival and stating "no I can wait. 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      Cognition Arousal/Alertness: Awake/alert Behavior During Therapy: WFL for tasks assessed/performed Overall Cognitive Status: Impaired/Different from baseline Area of Impairment: Following  commands;Memory;Safety/judgement;Awareness                   Current Attention Level: Selective Memory: Decreased short-term memory Following Commands: Follows one step commands consistently;Follows one step commands with increased time;Follows multi-step commands inconsistently;Follows multi-step commands with increased time Safety/Judgement: Decreased awareness of safety Awareness:  Emergent Problem Solving: Slow processing;Requires verbal cues General Comments: Continue to require increased time and cues for processing        Exercises    Shoulder Instructions       General Comments Son present throughotu sessio nadn confirmed that pt has been during UB/LB exercises daily. BP 155/57 at end of session. SpO2 96-98 throughout    Pertinent Vitals/ Pain       Pain Assessment: Faces Faces Pain Scale: Hurts a little bit Pain Location: B feet (L>R) - improved with shoes donned Pain Descriptors / Indicators: Grimacing;Guarding;Sore Pain Intervention(s): Monitored during session;Limited activity within patient's tolerance;Repositioned  Home Living                                          Prior Functioning/Environment              Frequency  Min 2X/week        Progress Toward Goals  OT Goals(current goals can now be found in the care plan section)  Progress towards OT goals: Progressing toward goals  Acute Rehab OT Goals Patient Stated Goal: to return to independence  OT Goal Formulation: With patient/family Time For Goal Achievement: 12/14/17 Potential to Achieve Goals: Good ADL Goals Pt Will Perform Eating: with modified independence;sitting Pt Will Perform Grooming: with min assist;standing Pt Will Perform Upper Body Bathing: with min assist;sitting Pt Will Perform Lower Body Bathing: with mod assist;sit to/from stand Pt Will Perform Upper Body Dressing: with min assist;sitting Pt Will Perform Lower Body Dressing: with mod assist;sit to/from stand Pt Will Transfer to Toilet: with min assist;ambulating;regular height toilet;grab bars Pt Will Perform Toileting - Clothing Manipulation and hygiene: with min assist;sit to/from stand  Plan Discharge plan remains appropriate    Co-evaluation                 AM-PAC PT "6 Clicks" Daily Activity     Outcome Measure   Help from another person eating meals?: A Little Help  from another person taking care of personal grooming?: A Little Help from another person toileting, which includes using toliet, bedpan, or urinal?: A Lot Help from another person bathing (including washing, rinsing, drying)?: A Lot Help from another person to put on and taking off regular upper body clothing?: A Lot Help from another person to put on and taking off regular lower body clothing?: A Lot 6 Click Score: 14    End of Session Equipment Utilized During Treatment: Gait belt;Rolling walker  OT Visit Diagnosis: Pain;Unsteadiness on feet (R26.81);Cognitive communication deficit (R41.841) Pain - part of body: Ankle and joints of foot;Leg(generalized; BLEs)   Activity Tolerance Patient tolerated treatment well   Patient Left with call bell/phone within reach;with family/visitor present;in chair   Nurse Communication Mobility status        Time: 6160-7371 OT Time Calculation (min): 21 min  Charges: OT General Charges $OT Visit: 1 Visit OT Treatments $Self Care/Home Management : 8-22 mins  [place], OTR/L Acute Rehab Pager: [phone] Office: [place] 12/03/2017, 1:42 PM

## 2017-12-03 NOTE — Progress Notes (Signed)
Patient ID: Sandy Owens, female   DOB: 01-20-1924, 84 y.o.   MRN: [id number] Admit to unit via bed, oriented to unit, plan of care, medications and therapy schedule. staes an understanding of information. Margarito Liner

## 2017-12-03 NOTE — H&P (Signed)
Physical Medicine and Rehabilitation Admission H&P    Chief Complaint  Patient presents with  . Fall    with facial injuries, not on blood thinners  : HPI: Sandy Owens is a 84 y.o. right-handed female with history of left breast cancer with mastectomy, CAD with CABG 2001, hypertension, diabetes mellitus.  Presented 11/26/2017 after ground-level fall.  Per chart review and daughter, patient lives alone.  Multilevel home.  Reports she does not drive due to poor vision.  She manages her own bills and finances.  She has a daughter in the area that checks on her regularly.  There is a son from out of town who plans to provide assistance as needed.  Cranial CT reviewed, showing SAH.  Per report, Cervical spine and maxillofacial films revealed acute moderate volume subarachnoid hemorrhage.  Acute open fracture through anterior maxilla including alveolar ridge with teeth 7, 8 and 9 avulsion fractures.  Acute right nasal bone, osseous nasal septum fracture.  Facial contusions and a 4 x 4 x 2.5 cm right anterior facial hematoma.  Acute nondisplaced C4 and C5 spinous process fractures.  Conservative care of traumatic subarachnoid hemorrhage.  Underwent extraction of multiple teeth 11/28/2017.  Soft collar for C4 and C5 spinous process fractures for comfort.  Incidental findings of right carotid stenosis await plan for CTA of neck versus follow-up as outpatient.  Hospital course pain management.  Acute blood loss anemia 8.9 and monitored.   Follow cranial CT scan of the head 11/27/2017 stable.  I will follow-up x-rays of right wrist hand as well as left wrist and hand unremarkable.  There was a lucency of right index finger likely felt to be gout no plan for MRI at this time.  Subcutaneous Lovenox added for DVT prophylaxis 12/01/2017.  Patient with bouts of urinary retention placed on low-dose Urecholine.  Dysphasia #1 thin liquid diet. Therapy evaluations completed with recommendations of physical medicine rehab  consult.  Patient was admitted for a comprehensive rehab program    Review of Systems  Constitutional: Negative for chills and fever.  HENT: Negative for hearing loss.   Eyes: Positive for blurred vision.  Respiratory: Negative for shortness of breath.   Cardiovascular: Negative for chest pain, palpitations and leg swelling.  Gastrointestinal: Positive for constipation. Negative for nausea.  Genitourinary: Negative for dysuria, flank pain and hematuria.  Musculoskeletal: Positive for falls, joint pain and myalgias.  Skin: Negative for rash.  Neurological: Negative for seizures.  All other systems reviewed and are negative.  Past Medical History:  Diagnosis Date  . Cancer [facility])    left breast  . Coronary artery disease    CABG - 3- 2001  . Diabetes mellitus without complication ([place])   . Hypertension   . Hypothyroidism   . Neuromuscular disorder ([place])    neuropathy in feet  and legs   Past Surgical History:  Procedure Laterality Date  . ABDOMINAL HYSTERECTOMY    . APPENDECTOMY    . BACK SURGERY    . cabg    . HIP SURGERY      right hip replacement  . JOINT REPLACEMENT     left knee replacement  . MASTECTOMY Left   . MULTIPLE EXTRACTIONS WITH ALVEOLOPLASTY N/A 11/28/2017   Procedure: MULTIPLE EXTRACTION WITH ALVEOLOPLASTY;  Surgeon: Diona Browner, DDS;  Location: [place];  Service: Oral Surgery;  Laterality: N/A;  . SIMPLE MASTECTOMY WITH AXILLARY SENTINEL NODE BIOPSY Left 06/06/2014   Procedure: LEFT SIMPLE MASTECTOMY;  Surgeon: Excell Seltzer,  MD;  Location: [place];  Service: General;  Laterality: Left;   Family History  Problem Relation Age of Onset  . Breast cancer Neg Hx    Social History:  reports that she has quit smoking. She has never used smokeless tobacco. She reports that she drank alcohol. She reports that she does not use drugs. Allergies: No Known Allergies Medications Prior to Admission  Medication Sig Dispense Refill  . amLODipine  (NORVASC) 5 MG tablet 5 mg daily.     Marland Kitchen aspirin 81 MG tablet Take 81 mg by mouth daily.    Marland Kitchen atenolol (TENORMIN) 50 MG tablet 50 mg 2 (two) times daily.     . Calcium Carbonate-Vit D-Min (GNP CALCIUM PLUS 600 +D PO) Take 1 tablet by mouth.    . colchicine 0.6 MG tablet Take 0.6 mg by mouth daily.    Marland Kitchen glimepiride (AMARYL) 1 MG tablet 1 mg daily with breakfast.     . levothyroxine (SYNTHROID, LEVOTHROID) 88 MCG tablet 88 mcg daily.     Marland Kitchen lisinopril (PRINIVIL,ZESTRIL) 5 MG tablet 5 mg daily.     . Multiple Vitamin (MULTIVITAMIN) tablet Take 1 tablet by mouth daily.    . naproxen sodium (ALEVE) 220 MG tablet Take 2 tablets every 12 hours until gout flare resolves.    . Omega-3 Fatty Acids (FISH OIL PO) Take 1 tablet by mouth 2 (two) times daily.    Marland Kitchen triamterene-hydrochlorothiazide (MAXZIDE-25) 37.5-25 MG per tablet daily.     Marland Kitchen ADVOCATE REDI-CODE test strip     . HYDROcodone-acetaminophen (NORCO/VICODIN) 5-325 MG per tablet Take 1-2 tablets by mouth every 4 (four) hours as needed for moderate pain. (Patient not taking: Reported on 11/26/2017) 40 tablet 0  . traMADol (ULTRAM) 50 MG tablet Take 1 tablet (50 mg total) by mouth every 6 (six) hours as needed. (Patient not taking: Reported on 11/26/2017) 6 tablet 0  . UNABLE TO FIND Med Name: Left Mastectomy Supplies  Diagnosis:  C50.912, Z90.12 (Patient not taking: Reported on 11/26/2017) 1 each 3    Drug Regimen Review Drug regimen was reviewed and remains appropriate with no significant issues identified  Home: Home Living Family/patient expects to be discharged to:: Inpatient rehab Living Arrangements: Alone Available Help at Discharge: Family, Available 24 hours/day(son and daughter to arrange 24/7 supervision at d/c) Type of Home: House Home Layout: Laundry or work area in basement, Designer, industrial/product of Steps: 14 Alternate Level Stairs-Rails: Right  Lives With: Alone   Functional History: Prior Function Level of  Independence: Independent Comments: reports she does not drive due to poor vision at baseline, she does not normally walk with an AD, she manages her bills with help from her daughter and she uses a pill box for her meds.   Functional Status:  Mobility: Bed Mobility Overal bed mobility: Needs Assistance Bed Mobility: Supine to Sit Rolling: Min guard, Min assist Supine to sit: HOB elevated, Min assist Sit to supine: Min assist General bed mobility comments: Assist provided for frequent position changes for vestibular testing Transfers Overall transfer level: Needs assistance Equipment used: Rolling walker (2 wheeled) Transfers: Sit to/from Stand Sit to Stand: Min assist Stand pivot transfers: Min assist  Lateral/Scoot Transfers: Mod assist, +2 physical assistance General transfer comment: Assist for boost to stand, and for balance support when takin gpivotal steps around to the bed.  Ambulation/Gait Ambulation/Gait assistance: Min assist, +2 safety/equipment Ambulation Distance (Feet): 60 Feet(30'; seated rest; 30') Assistive device: Rolling walker (2 wheeled)  Gait Pattern/deviations: Decreased stride length, Shuffle, Trunk flexed General Gait Details: With shoes donned, pt was able to ambulate fairly well with RW for support. Assist provided for balance and occasional walker management. Family member provided chair follow, and pt took 1 seated rest break before ambulating back to the room. Pt reports SOB and continued "woozy" feeling while ambulating, and was instructed in pursed-lip breathing. Sats improved from 93-98% on RA during seated rest. Gait velocity: Decreased Gait velocity interpretation: Below normal speed for age/gender    ADL: ADL Overall ADL's : Needs assistance/impaired Eating/Feeding: Sitting, Set up, Supervision/ safety Eating/Feeding Details (indicate cue type and reason): Pt sitting at EOB with supervision for safety. Pt able to hold spoon and bring to mouth,  Noted compensatory hiking of shoulder (RUE) to bring spoon to mouth. Pt with poor lip closure when drinking from a straw; providing Min cues for closing lips after each sip of drink through a straw.  Grooming: Wash/dry face, Minimal assistance, Standing Grooming Details (indicate cue type and reason): Min A for standing balance. Pt requiring increased time and cues for sequencing. Performing oral care and washing her face and requiring either single hand on sink or hips against sink for balance. Pt stating her back starting to hurt and requesting to sit down.  Upper Body Bathing: Total assistance, Sitting Lower Body Bathing: Total assistance, Bed level Upper Body Dressing : Total assistance, Bed level Lower Body Dressing: Maximal assistance, Sit to/from stand Lower Body Dressing Details (indicate cue type and reason): Max A to don socks  Toilet Transfer: Maximal assistance, +2 for physical assistance Toilet Transfer Details (indicate cue type and reason): Pt performing stand pivot transfer to recliner with Max A +2. Pt motivated to participate in therapy despite significant pain in bilateral feet. Pt requiring Min A to facilitate lateral movement of LLE and has difficulty putting weight thorugh LLE due to pain.  Toileting- Clothing Manipulation and Hygiene: Total assistance, Bed level Functional mobility during ADLs: Minimal assistance, +2 for safety/equipment, Rolling walker General ADL Comments: Pt highly motivated to participate in therapy and return to PLOF. Pt eating upon arrival and stating "no I can wait. I want to work"  Cognition: Cognition Overall Cognitive Status: Impaired/Different from baseline Orientation Level: Oriented X4 Cognition Arousal/Alertness: Awake/alert Behavior During Therapy: WFL for tasks assessed/performed Overall Cognitive Status: Impaired/Different from baseline Area of Impairment: Following commands, Memory, Safety/judgement, Awareness Current Attention Level:  Selective Memory: Decreased short-term memory Following Commands: Follows one step commands consistently, Follows one step commands with increased time, Follows multi-step commands inconsistently, Follows multi-step commands with increased time Safety/Judgement: Decreased awareness of safety Awareness: Emergent Problem Solving: Slow processing, Requires verbal cues General Comments: Continue to require increased time and cues for processing  Physical Exam: Blood pressure (!) 147/53, pulse 61, temperature 98.3 F (36.8 C), temperature source Oral, resp. rate (!) 21, height 5' (1.524 m), weight 59.8 kg (131 lb 13.4 oz), SpO2 96 %. Physical Exam  Vitals reviewed. Constitutional: She appears well-developed and well-nourished.  HENT:  Multiple bruises to the face forehead as well as ecchymosis  Eyes: EOM are normal. Right eye exhibits no discharge. Left eye exhibits no discharge.  Pupils are reactive to light  Neck:  A soft cervical collar was in place  Cardiovascular: Normal rate, regular rhythm and normal heart sounds.  Respiratory: Effort normal and breath sounds normal. No respiratory distress.  GI: Soft. Bowel sounds are normal. She exhibits no distension.  Musculoskeletal:  Facial edema and tenderness.  Tenderness bilaterally feet  Neurological:  She is alert.  She provides her name and age.   Some mild delay in processing.   Follows simple commands Motor: RUE: 4+/5 proximal to distal LUE: 4/5 proximal to distal LLE: 4--4/5 proximal to distal RLE: 4--4/5 proximal to distal   Skin:  Skin.  Healing abrasions  Psychiatric: She has a normal mood and affect. Her behavior is normal.   Results for orders placed or performed during the hospital encounter of 11/26/17 (from the past 48 hour(s))  Glucose, capillary     Status: Abnormal   Collection Time: 12/01/17  4:23 PM  Result Value Ref Range   Glucose-Capillary 138 (H) 65 - 99 mg/dL  Glucose, capillary     Status: Abnormal    Collection Time: 12/01/17  9:57 PM  Result Value Ref Range   Glucose-Capillary 127 (H) 65 - 99 mg/dL  Basic metabolic panel     Status: Abnormal   Collection Time: 12/02/17  4:11 AM  Result Value Ref Range   Sodium 134 (L) 135 - 145 mmol/L   Potassium 3.8 3.5 - 5.1 mmol/L   Chloride 100 (L) 101 - 111 mmol/L   CO2 21 (L) 22 - 32 mmol/L   Glucose, Bld 96 65 - 99 mg/dL   BUN 18 6 - 20 mg/dL   Creatinine, Ser 1.02 (H) 0.44 - 1.00 mg/dL   Calcium 8.1 (L) 8.9 - 10.3 mg/dL   GFR calc non Af Amer 46 (L) >60 mL/min   GFR calc Af Amer 54 (L) >60 mL/min    Comment: (NOTE) The eGFR has been calculated using the CKD EPI equation. This calculation has not been validated in all clinical situations. eGFR's persistently <60 mL/min signify possible Chronic Kidney Disease.    Anion gap 13 5 - 15    Comment: Performed at [facility] [address], [place], [place] [address]  CBC     Status: Abnormal   Collection Time: 12/02/17  4:11 AM  Result Value Ref Range   WBC 12.4 (H) 4.0 - 10.5 K/uL   RBC 2.93 (L) 3.87 - 5.11 MIL/uL   Hemoglobin 8.9 (L) 12.0 - 15.0 g/dL   HCT 27.9 (L) 36.0 - 46.0 %   MCV 95.2 78.0 - 100.0 fL   MCH 30.4 26.0 - 34.0 pg   MCHC 31.9 30.0 - 36.0 g/dL   RDW 13.6 11.5 - 15.5 %   Platelets 227 150 - 400 K/uL    Comment: Performed at [facility], [place] [address], [place], [place] [address]  Glucose, capillary     Status: Abnormal   Collection Time: 12/02/17  7:11 AM  Result Value Ref Range   Glucose-Capillary 107 (H) 65 - 99 mg/dL  Glucose, capillary     Status: Abnormal   Collection Time: 12/02/17  1:12 PM  Result Value Ref Range   Glucose-Capillary 143 (H) 65 - 99 mg/dL  Glucose, capillary     Status: Abnormal   Collection Time: 12/02/17  4:25 PM  Result Value Ref Range   Glucose-Capillary 191 (H) 65 - 99 mg/dL   Comment 1 Notify RN    Comment 2 Document in Chart   Glucose, capillary     Status: Abnormal   Collection Time: 12/02/17  9:26 PM  Result  Value Ref Range   Glucose-Capillary 140 (H) 65 - 99 mg/dL  Glucose, capillary     Status: Abnormal   Collection Time: 12/03/17  7:51 AM  Result  Value Ref Range   Glucose-Capillary 114 (H) 65 - 99 mg/dL  CBC     Status: Abnormal   Collection Time: 12/03/17 11:33 AM  Result Value Ref Range   WBC 9.9 4.0 - 10.5 K/uL   RBC 3.28 (L) 3.87 - 5.11 MIL/uL   Hemoglobin 10.3 (L) 12.0 - 15.0 g/dL   HCT 30.8 (L) 36.0 - 46.0 %   MCV 93.9 78.0 - 100.0 fL   MCH 31.4 26.0 - 34.0 pg   MCHC 33.4 30.0 - 36.0 g/dL   RDW 13.5 11.5 - 15.5 %   Platelets 291 150 - 400 K/uL    Comment: Performed at [facility], [place] [address] [place], [place] [address]  Basic metabolic panel     Status: Abnormal   Collection Time: 12/03/17 11:33 AM  Result Value Ref Range   Sodium 132 (L) 135 - 145 mmol/L   Potassium 4.1 3.5 - 5.1 mmol/L   Chloride 98 (L) 101 - 111 mmol/L   CO2 24 22 - 32 mmol/L   Glucose, Bld 144 (H) 65 - 99 mg/dL   BUN 18 6 - 20 mg/dL   Creatinine, Ser 1.07 (H) 0.44 - 1.00 mg/dL   Calcium 8.4 (L) 8.9 - 10.3 mg/dL   GFR calc non Af Amer 44 (L) >60 mL/min   GFR calc Af Amer 51 (L) >60 mL/min    Comment: (NOTE) The eGFR has been calculated using the CKD EPI equation. This calculation has not been validated in all clinical situations. eGFR's persistently <60 mL/min signify possible Chronic Kidney Disease.    Anion gap 10 5 - 15    Comment: Performed at [facility] [address] [place], [place] [address]  Glucose, capillary     Status: Abnormal   Collection Time: 12/03/17 11:59 AM  Result Value Ref Range   Glucose-Capillary 155 (H) 65 - 99 mg/dL   Dg Wrist Complete Left  Result Date: 12/01/2017 CLINICAL DATA:  Left wrist pain after fall. EXAM: LEFT WRIST - COMPLETE 3+ VIEW COMPARISON:  None. FINDINGS: There is no evidence of fracture or dislocation. Moderate narrowing and osteophyte formation is seen involving the first carpometacarpal joint. Soft tissues are unremarkable.  IMPRESSION: Moderate osteoarthritis of the first carpometacarpal joint. No acute abnormality seen in the left wrist. Electronically Signed   By: Marijo Conception, M.D.   On: 12/01/2017 17:05   Dg Wrist Complete Right  Result Date: 12/01/2017 CLINICAL DATA:  Fall.  Pain. EXAM: RIGHT WRIST - COMPLETE 3+ VIEW COMPARISON:  None. FINDINGS: There is no evidence of fracture or dislocation. There is no evidence of arthropathy or other focal bone abnormality. Soft tissues are unremarkable, except for overlying IV tubing. IMPRESSION: Negative. Electronically Signed   By: Staci Righter M.D.   On: 12/01/2017 17:01   Dg Chest Port 1 View  Result Date: 12/02/2017 CLINICAL DATA:  Fever EXAM: PORTABLE CHEST 1 VIEW COMPARISON:  Chest x-ray of 11/30/2017 FINDINGS: There is persistent opacity at the medial left lung base suspicious for atelectasis and/or pneumonia. Two-view chest x-ray may be helpful. The right lung is clear. Mediastinal and hilar contours are unremarkable and median sternotomy sutures are noted from prior CABG. Moderate cardiomegaly is unchanged. No bony abnormality seen other than degenerative change in both shoulders. IMPRESSION: 1. Persistent opacity at the medial left lung base. Consider atelectasis or possibly left lower lobe pneumonia. 2. Stable cardiomegaly. Electronically Signed   By: Ivar Drape M.D.   On:  12/02/2017 09:36   Dg Hand Complete Left  Result Date: 12/01/2017 CLINICAL DATA:  Left hand pain after fall. EXAM: LEFT HAND - COMPLETE 3+ VIEW COMPARISON:  None. FINDINGS: There is no evidence of fracture or dislocation. Vascular calcifications are noted. Moderate narrowing and osteophyte formation is seen involving the first carpometacarpal joint. IMPRESSION: Moderate osteoarthritis of the first carpometacarpal joint. No acute abnormality seen in the left hand. Electronically Signed   By: Marijo Conception, M.D.   On: 12/01/2017 17:08   Dg Hand Complete Right  Result Date: 12/01/2017 CLINICAL  DATA:  Bilateral hand pain after falling. EXAM: RIGHT HAND - COMPLETE 3+ VIEW COMPARISON:  Radiographs of February 21, 2013. FINDINGS: There is interval development involving lytic destruction involving the medial aspect of the distal portion of second proximal phalanx and proximal base of second middle phalanx. Lucency is also noted in the distal portion of the second proximal phalanx. This is concerning for possible malignancy or osteomyelitis or some form of erosive arthritis. MRI is recommended for further evaluation. No other fracture or bony abnormality is noted. IMPRESSION: There is lytic destruction seen involving portions of the second proximal and middle phalanges concerning for osteomyelitis, or erosive arthritis or possibly malignancy. Further evaluation with MRI with and without gadolinium is recommended. Electronically Signed   By: Marijo Conception, M.D.   On: 12/01/2017 17:03       Medical Problem List and Plan: 1.  Decreased functional mobility secondary to [facility] as well as multiple facial fractures, nasal septum fracture with facial contusions.  Acute nondisplaced C4 and C5 spinous process fractures-soft cervical collar for comfort.  Multiple tooth extractions after fall 11/26/2017 2.  DVT Prophylaxis/Anticoagulation: Subcutaneous Lovenox initiated 12/01/2017.  Check vascular study 3. Pain Management: Neurontin 100 mg twice daily, Anaprox 275 mg twice daily, hydrocodone/Ultram as needed 4. Mood: Provide emotional support 5. Neuropsych: This patient is capable of making decisions on her own behalf. 6. Skin/Wound Care: Routine skin checks 7. Fluids/Electrolytes/Nutrition: Routine I&O's with follow-up chemistries 8.  Acute blood loss anemia.  Latest hemoglobin 8.9.  Follow-up CBC 9.  Urinary retention.  Urecholine 5 mg 3 times daily.  Check PVR x3 10.  Hypertension.  Norvasc 5 mg daily, Tenormin 50 mg twice daily, lisinopril 5 mg daily.  Monitor with increased mobility 11.  History of gout.   Colchicine 0.6 mg daily 12.  History of CAD with CABG 2001.  No chest pain or shortness of breath 13.  History of left breast cancer with mastectomy 14.  Hypothyroidism.  Synthroid 15.  Diabetes mellitus.  Check blood sugars before meals and at bedtime.  SSI.  Patient on Amaryl 1 mg daily prior to admission   Post Admission Physician Evaluation: Preadmission assessment reviewed and changes made below. Functional deficits secondary  to Polytrauma with TBI. 1. Patient is admitted to receive collaborative, interdisciplinary care between the physiatrist, rehab nursing staff, and therapy team. 2. Patient's level of medical complexity and substantial therapy needs in context of that medical necessity cannot be provided at a lesser intensity of care such as a SNF. 3. Patient has experienced substantial functional loss from his/her baseline which was documented above under the "Functional History" and "Functional Status" headings.  Judging by the patient's diagnosis, physical exam, and functional history, the patient has potential for functional progress which will result in measurable gains while on inpatient rehab.  These gains will be of substantial and practical use upon discharge  in facilitating mobility and self-care at the  household level. 4. Physiatrist will provide 24 hour management of medical needs as well as oversight of the therapy plan/treatment and provide guidance as appropriate regarding the interaction of the two. 5. 24 hour rehab nursing will assist with bladder management, safety, skin/wound care, disease management, pain management and patient education  and help integrate therapy concepts, techniques,education, etc. 6. PT will assess and treat for/with: Lower extremity strength, range of motion, stamina, balance, functional mobility, safety, adaptive techniques and equipment, woundcare, coping skills, pain control, education.   Goals are: Supervision. OT will assess and treat  for/with: ADL's, functional mobility, safety, upper extremity strength, adaptive techniques and equipment, wound mgt, ego support, and community reintegration.   Goals are: Supervision. Therapy may not proceed with showering this patient. 7. SLP will assess and treat for/with: cognition, swallowing, speech.  Goals are: Mod I. 8. Case Management and Social Worker will assess and treat for psychological issues and discharge planning. 9. Team conference will be held weekly to assess progress toward goals and to determine barriers to discharge. 10. Patient will receive at least 3 hours of therapy per day at least 5 days per week. 11. ELOS: 12-17 days.       12. Prognosis:  excellent  Delice Lesch, MD, ABPMR Lavon Paganini Angiulli, PA-C 12/03/2017

## 2017-12-03 NOTE — Progress Notes (Signed)
[facility]/Trauma Progress Note  5 Days Post-Op   Assessment/Plan Ground levelFall TBIstable (moderate volume SAH)CT repeat ok OOB. F/u Dr Ronnald Ramp in 2-3 wks post dc C4 and C5 spinous process fractures- soft collar in place for comfort R carotid stenosis- Incidental finding "Severe atherosclerosis with suspected RIGHT carotid artery hemodynamically significant stenosis which could be confirmed with CTA NECK on nonemergent basis", can follow up with [place] also or do early this week Facial fractures-open anterior maxilla including alveolar ridge with teeth 7,8,9 avulsion fx; right nasal bone fx and nasal septum fx, facial contusions and right anterior facial hematoma:seen& lip lac closedby ENTDr. Constance Holster; s/p removal of multiple teeth by Dr Hoyt Koch New O2 requirement -improved and on RA - chest xray 03/31 showed low volume chest with LLL atelectasis or PNA - chest xray 04/02 showed persistent opacity at medial LLL, afebrile Urinary retention - foley replaced 03/31, started urecholine - passed voiding trial yesterday, foley DC'd 04/02 Foot pain - started gabapentin, not really helping - could be gout, on colchicine and started Aleve, seems to be helping B/l wrist/hand pain - xrays neg for fracture, lucency of R index finger and ortho reviewed films and it's likely from gout, MRI only if pain and pt is not complaining of pain in that finger  DM-SSI EYC:XKGYJE diet VTE: SCD's, lovenox HU:DJSHF pre-op, afebrile, WBC 14.7 Foley:voiding trial today, creatinine improving Follow up:TBD  DISPO:OOB, ambulate, PT/OT, CIR pending      LOS: 7 days    Subjective: CC: facial pain  Pt states she is feeling as good as she can be. Son at bedside. Walked a little with PT yesterday. She thinks her foot pain is improved. No issues overnight. No nausea, vomiting, fever, chills, new cough, SOB, CP.   Objective: Vital signs in last 24 hours: Temp:  [97.1 F (36.2  C)-98.7 F (37.1 C)] 97.1 F (36.2 C) (04/03 0748) Pulse Rate:  [52-66] 56 (04/03 0748) Resp:  [18-38] 18 (04/03 0748) BP: (122-166)/(45-72) 151/53 (04/03 0748) SpO2:  [95 %-100 %] 95 % (04/03 0748) Last BM Date: (PTA)  Intake/Output from previous day: 04/02 0701 - 04/03 0700 In: 720 [P.O.:720] Out: 1650 [Urine:1650] Intake/Output this shift: No intake/output data recorded.  PE: Gen: Alert, NAD, pleasant, cooperative HEENT: facial swelling and ecchymosis improved, lip suture well appearing, pupils are equal and round, vision yesterday was at pt's baseline did not reassess today Card: RRR Pulm:CTA with diminished breaths sounds on the left, rate andeffort normal Abd: Soft, NT/ND, +BS Extremities: WYO:VZCHY to b/l hands and forearms improved, BLE: 1+ pitting edema of feet and pain with light touch of feet improved Skin: no rashes noted, warm and dry  Anti-infectives: Anti-infectives (From admission, onward)   Start     Dose/Rate Route Frequency Ordered Stop   11/28/17 0915  ceFAZolin (ANCEF) IVPB 2g/100 mL premix     2 g 200 mL/hr over 30 Minutes Intravenous To ShortStay Surgical 11/28/17 0903 11/28/17 1021      Lab Results:  Recent Labs    12/01/17 0343 12/02/17 0411  WBC 14.7* 12.4*  HGB 9.3* 8.9*  HCT 28.1* 27.9*  PLT 230 227   BMET Recent Labs    12/01/17 0343 12/02/17 0411  NA 132* 134*  K 4.2 3.8  CL 101 100*  CO2 21* 21*  GLUCOSE 134* 96  BUN 18 18  CREATININE 1.13* 1.02*  CALCIUM 8.0* 8.1*   PT/INR No results for input(s): LABPROT, INR in the last 72 hours. CMP  Component Value Date/Time   NA 134 (L) 12/02/2017 0411   K 3.8 12/02/2017 0411   CL 100 (L) 12/02/2017 0411   CO2 21 (L) 12/02/2017 0411   GLUCOSE 96 12/02/2017 0411   BUN 18 12/02/2017 0411   CREATININE 1.02 (H) 12/02/2017 0411   CALCIUM 8.1 (L) 12/02/2017 0411   PROT 6.0 (L) 11/26/2017 1633   ALBUMIN 2.9 (L) 11/26/2017 1633   AST 55 (H) 11/26/2017 1633   ALT 32  11/26/2017 1633   ALKPHOS 66 11/26/2017 1633   BILITOT 0.8 11/26/2017 1633   GFRNONAA 46 (L) 12/02/2017 0411   GFRAA 54 (L) 12/02/2017 0411   Lipase  No results found for: LIPASE  Studies/Results: Dg Wrist Complete Left  Result Date: 12/01/2017 CLINICAL DATA:  Left wrist pain after fall. EXAM: LEFT WRIST - COMPLETE 3+ VIEW COMPARISON:  None. FINDINGS: There is no evidence of fracture or dislocation. Moderate narrowing and osteophyte formation is seen involving the first carpometacarpal joint. Soft tissues are unremarkable. IMPRESSION: Moderate osteoarthritis of the first carpometacarpal joint. No acute abnormality seen in the left wrist. Electronically Signed   By: Marijo Conception, M.D.   On: 12/01/2017 17:05   Dg Wrist Complete Right  Result Date: 12/01/2017 CLINICAL DATA:  Fall.  Pain. EXAM: RIGHT WRIST - COMPLETE 3+ VIEW COMPARISON:  None. FINDINGS: There is no evidence of fracture or dislocation. There is no evidence of arthropathy or other focal bone abnormality. Soft tissues are unremarkable, except for overlying IV tubing. IMPRESSION: Negative. Electronically Signed   By: Staci Righter M.D.   On: 12/01/2017 17:01   Dg Chest Port 1 View  Result Date: 12/02/2017 CLINICAL DATA:  Fever EXAM: PORTABLE CHEST 1 VIEW COMPARISON:  Chest x-ray of 11/30/2017 FINDINGS: There is persistent opacity at the medial left lung base suspicious for atelectasis and/or pneumonia. Two-view chest x-ray may be helpful. The right lung is clear. Mediastinal and hilar contours are unremarkable and median sternotomy sutures are noted from prior CABG. Moderate cardiomegaly is unchanged. No bony abnormality seen other than degenerative change in both shoulders. IMPRESSION: 1. Persistent opacity at the medial left lung base. Consider atelectasis or possibly left lower lobe pneumonia. 2. Stable cardiomegaly. Electronically Signed   By: Ivar Drape M.D.   On: 12/02/2017 09:36   Dg Hand Complete Left  Result Date:  12/01/2017 CLINICAL DATA:  Left hand pain after fall. EXAM: LEFT HAND - COMPLETE 3+ VIEW COMPARISON:  None. FINDINGS: There is no evidence of fracture or dislocation. Vascular calcifications are noted. Moderate narrowing and osteophyte formation is seen involving the first carpometacarpal joint. IMPRESSION: Moderate osteoarthritis of the first carpometacarpal joint. No acute abnormality seen in the left hand. Electronically Signed   By: Marijo Conception, M.D.   On: 12/01/2017 17:08   Dg Hand Complete Right  Result Date: 12/01/2017 CLINICAL DATA:  Bilateral hand pain after falling. EXAM: RIGHT HAND - COMPLETE 3+ VIEW COMPARISON:  Radiographs of February 21, 2013. FINDINGS: There is interval development involving lytic destruction involving the medial aspect of the distal portion of second proximal phalanx and proximal base of second middle phalanx. Lucency is also noted in the distal portion of the second proximal phalanx. This is concerning for possible malignancy or osteomyelitis or some form of erosive arthritis. MRI is recommended for further evaluation. No other fracture or bony abnormality is noted. IMPRESSION: There is lytic destruction seen involving portions of the second proximal and middle phalanges concerning for osteomyelitis, or erosive arthritis or possibly  malignancy. Further evaluation with MRI with and without gadolinium is recommended. Electronically Signed   By: Marijo Conception, M.D.   On: 12/01/2017 17:03      Kalman Drape , [facility] 12/03/2017, 9:37 AM  Pager: [phone] Mon-Wed, Friday 7:00am-4:30pm Thurs 7am-11:30am

## 2017-12-03 NOTE — Discharge Summary (Signed)
[facility]/Trauma Discharge Summary   Patient ID: Sandy Owens MRN: [id number] DOB/AGE: [birth year] 84 y.o.  Admit date: 11/26/2017 Discharge date: 12/03/2017  Admitting Diagnosis: Fall TBI [facility] Multiple facial and oral fracture C4 and C5 spinous process fractures  Discharge Diagnosis Patient Active Problem List   Diagnosis Date Noted  . Fall   . Hypoxia   . Lip laceration   . Pain   . Subarachnoid hemorrhage (Sandy Owens)   . History of breast cancer   . Coronary artery disease involving coronary bypass graft of native heart without angina pectoris   . Benign essential HTN   . Type 2 diabetes mellitus with peripheral neuropathy (HCC)   . Acute idiopathic gout of right foot   . Dysphagia   . Leukocytosis   . Acute blood loss anemia   . Stage 3 chronic kidney disease ([place])   . Multiple trauma 11/26/2017  . Breast cancer (Sandy Owens) 06/06/2014  . Cancer of breast, female [facility]) 04/13/2014    Consultants Neurosurgery ENT Oral Surgery Physical Medicine  Imaging: Dg Wrist Complete Left  Result Date: 12/01/2017 CLINICAL DATA:  Left wrist pain after fall. EXAM: LEFT WRIST - COMPLETE 3+ VIEW COMPARISON:  None. FINDINGS: There is no evidence of fracture or dislocation. Moderate narrowing and osteophyte formation is seen involving the first carpometacarpal joint. Soft tissues are unremarkable. IMPRESSION: Moderate osteoarthritis of the first carpometacarpal joint. No acute abnormality seen in the left wrist. Electronically Signed   By: Marijo Conception, M.D.   On: 12/01/2017 17:05   Dg Wrist Complete Right  Result Date: 12/01/2017 CLINICAL DATA:  Fall.  Pain. EXAM: RIGHT WRIST - COMPLETE 3+ VIEW COMPARISON:  None. FINDINGS: There is no evidence of fracture or dislocation. There is no evidence of arthropathy or other focal bone abnormality. Soft tissues are unremarkable, except for overlying IV tubing. IMPRESSION: Negative. Electronically Signed   By: Staci Righter M.D.   On:  12/01/2017 17:01   Dg Chest Port 1 View  Result Date: 12/02/2017 CLINICAL DATA:  Fever EXAM: PORTABLE CHEST 1 VIEW COMPARISON:  Chest x-ray of 11/30/2017 FINDINGS: There is persistent opacity at the medial left lung base suspicious for atelectasis and/or pneumonia. Two-view chest x-ray may be helpful. The right lung is clear. Mediastinal and hilar contours are unremarkable and median sternotomy sutures are noted from prior CABG. Moderate cardiomegaly is unchanged. No bony abnormality seen other than degenerative change in both shoulders. IMPRESSION: 1. Persistent opacity at the medial left lung base. Consider atelectasis or possibly left lower lobe pneumonia. 2. Stable cardiomegaly. Electronically Signed   By: Ivar Drape M.D.   On: 12/02/2017 09:36   Dg Hand Complete Left  Result Date: 12/01/2017 CLINICAL DATA:  Left hand pain after fall. EXAM: LEFT HAND - COMPLETE 3+ VIEW COMPARISON:  None. FINDINGS: There is no evidence of fracture or dislocation. Vascular calcifications are noted. Moderate narrowing and osteophyte formation is seen involving the first carpometacarpal joint. IMPRESSION: Moderate osteoarthritis of the first carpometacarpal joint. No acute abnormality seen in the left hand. Electronically Signed   By: Marijo Conception, M.D.   On: 12/01/2017 17:08   Dg Hand Complete Right  Result Date: 12/01/2017 CLINICAL DATA:  Bilateral hand pain after falling. EXAM: RIGHT HAND - COMPLETE 3+ VIEW COMPARISON:  Radiographs of February 21, 2013. FINDINGS: There is interval development involving lytic destruction involving the medial aspect of the distal portion of second proximal phalanx and proximal base of second middle phalanx. Lucency is also noted  in the distal portion of the second proximal phalanx. This is concerning for possible malignancy or osteomyelitis or some form of erosive arthritis. MRI is recommended for further evaluation. No other fracture or bony abnormality is noted. IMPRESSION: There is  lytic destruction seen involving portions of the second proximal and middle phalanges concerning for osteomyelitis, or erosive arthritis or possibly malignancy. Further evaluation with MRI with and without gadolinium is recommended. Electronically Signed   By: Marijo Conception, M.D.   On: 12/01/2017 17:03    Procedures Dr. Stefanie Libel (11/28/17) - Extraction of teeth #8, #10, #11, #13; open reduction of alveolar fracture, enameloplasty teeth #20 through #28, suture gingival laceration.  HPI: Patient is a 84 year old female who presents status post a ground-level fall around 1:00.  She was found several hours later by family.  She has been in the ER for 3-4 hours and was already been evaluated by facial surgery and neurosurgery.  Trauma has been asked to admit the patient.  There did not appear to be loss of consciousness.  She lives alone and is otherwise been in fairly good health.  During her workup, her CAT scan of the head, face, and neck showed a subarachnoid hemorrhage, multiple facial fractures, teeth avulsions, and several cervical spinous process fractures.  She currently is awake and alert and denies chest pain, shortness of breath, or abdominal pain.   Hospital Course:  Workup showed TBI, SAH, C4 and C5 spinous process fractures, and multiple facial and oral fractures. Pt was evaluated in the ER by neurosurgery and facial surgery. Pt was admitted to the Trauma service to the ICU. Repeat CT head the following day showed stable moderate volume of subarachnoid hemorrhage. Neurosurgery recommended soft C collar for 6 weeks for cervical spinous process fractures. Pt went to the OR for procedure listed above. Foley was removed on 03/31 but pt had issues with urinary retention so foley was replaced that same evening. Foley was removed and pt passed a voiding trail on 04/02. Pt had a new O2 requirement due to low O2 stats and chest xray showed LLL atelectasis or pneumonia. Pt remained afebrile except for a  few isolated low grade fevers. Pt was complaining of foot pain different than her neuropathy. Gout flare up was suspected and Aleve was started which seemed to help. Incidental R carotid stenosis was seen on CT and follow up US was recommended. Diet was advanced as tolerated.  On 04/03, the patient was voiding well, tolerating diet, ambulating well, pain well controlled, vital signs stable, and felt stable for discharge to inpatient rehab. Pt knows to follow up as outlined below. Patient was discharged in good condition.  Physical Exam: Gen: Alert, NAD, pleasant, cooperative HEENT: facial swelling and ecchymosis improved, lip suture well appearing, pupils are equal and round, vision yesterday was at pt's baseline did not reassess today Card: RRR Pulm:CTA with diminished breaths sounds on the left, rate andeffort normal Abd: Soft, NT/ND, +BS Extremities: XFG:HWEXH to b/l hands and forearms improved,BLE: 1+ pitting edema of feet and pain with light touch of feet improved Skin: no rashes noted, warm and dry   Follow-up Information    Eustace Moore, MD. Schedule an appointment as soon as possible for a visit in 3 week(s).   Specialty:  Neurosurgery Contact information: [address] [place] [address] [phone]        Diona Browner, [place]. Schedule an appointment as soon as possible for a visit in 1 week.  Specialty:  Oral Surgery Why:  For wound re-check Contact information: [place] [address] [phone]        [place]. Call.   Why:  as needed with questions or concerns Contact information: [address] [address] [phone]          Signed: [facility] 12/03/2017, 3:23 PM Pager: [phone] Consults: [phone] Mon-Fri 7:00 am-4:30 pm Sat-Sun 7:00 am-11:30 am

## 2017-12-03 NOTE — Progress Notes (Signed)
Physical Therapy Vestibular Evaluation/Treatment Patient Details Name: Sandy Owens MRN: [id number] DOB: 1924-04-25 Today's Date: 12/03/2017    History of Present Illness 84 y.o. female admitted on 11/26/17 for fall with resultant moderate volume SAH, C4/5 spinous process fractures (soft collar), R carotid stenosis, facial fxs (open anterior maxilla including alveolar ridge with teetch 7,8,9 avulsion fx, R nasal bone fx, nasal septum fx, facial contusions, and right anterior facial hematoma, multiple lip lacs s/p closure) and oral surgery on 11/28/17  extraction of teeth # 8, 10, 11, 13, open reduction alvolar fx, enamloplasty teeth # 20 -28.  Pt with significant PMH of neuromuscular d/o, HTN, DM, CAD, L breast CA s/p L mastectomy, L TKA, CABG, and back surgery.    PT Comments    Pt reporting dizziness with position changes, especially in rolling during prior therapy sessions. Today reports she had dizziness with bending forward at baseline, which is what she attributes to her fall.   Noted L rotational nystagmus with roll to the L, which switched to R downbeating nystagmus when the bed was positioned in trendelenberg. When rolling R, pt was noted to have horizontal ageotropic nystagmus. Due to restrictions in cervical movement and soft collar, we utilized the bed in trendelenberg to modify positioning for canalith repositioning technique. Based on the symptoms today, suspect R anterior canal dysfunction and treated canalithiasis. Suspect other canals may be effected as well, and will retest tomorrow.    Follow Up Recommendations  CIR     Equipment Recommendations  Rolling walker with 5" wheels    Recommendations for Other Services Rehab consult     Precautions / Restrictions Precautions Precautions: Fall Precaution Comments: Low vision at baseline; BPPV Restrictions Weight Bearing Restrictions: No    Mobility  Bed Mobility Overal bed mobility: Needs Assistance Bed Mobility:  Supine to Sit Rolling: Min guard;Min assist   Supine to sit: HOB elevated;Min assist Sit to supine: Min assist   General bed mobility comments: Assist provided for frequent position changes for vestibular testing  Transfers Overall transfer level: Needs assistance Equipment used: Rolling walker (2 wheeled) Transfers: Sit to/from Stand Sit to Stand: Min assist Stand pivot transfers: Min assist       General transfer comment: Assist for boost to stand, and for balance support when takin gpivotal steps around to the bed.   Ambulation/Gait Ambulation/Gait assistance: Min assist;+2 safety/equipment Ambulation Distance (Feet): 60 Feet(30'; seated rest; 30') Assistive device: Rolling walker (2 wheeled) Gait Pattern/deviations: Decreased stride length;Shuffle;Trunk flexed Gait velocity: Decreased Gait velocity interpretation: Below normal speed for age/gender General Gait Details: With shoes donned, pt was able to ambulate fairly well with RW for support. Assist provided for balance and occasional walker management. Family member provided chair follow, and pt took 1 seated rest break before ambulating back to the room. Pt reports SOB and continued "woozy" feeling while ambulating, and was instructed in pursed-lip breathing. Sats improved from 93-98% on RA during seated rest.   Stairs            Wheelchair Mobility    Modified Rankin (Stroke Patients Only)       Balance Overall balance assessment: Needs assistance Sitting-balance support: Feet supported;No upper extremity supported Sitting balance-Leahy Scale: Fair     Standing balance support: During functional activity;Single extremity supported Standing balance-Leahy Scale: Poor Standing balance comment: Reliant on UE support during functional mobility.      12/03/17 0001  Symptom Behavior  Type of Dizziness  ("Wooziness")  Duration of Dizziness ~1 minute  Aggravating Factors Rolling to right;Rolling to  left;Forward bending;Supine to sit;Lying supine  Occulomotor Exam  Occulomotor Alignment Abnormal (Low vision in L eye and noted alignment not equal)  Positional Sensitivities  Sit to Supine 2  Supine to Left Side 3  Supine to Right Side 3  Supine to Sitting 2        Cognition Arousal/Alertness: Awake/alert Behavior During Therapy: WFL for tasks assessed/performed Overall Cognitive Status: Impaired/Different from baseline Area of Impairment: Following commands;Memory;Safety/judgement;Awareness                   Current Attention Level: Selective Memory: Decreased short-term memory Following Commands: Follows one step commands consistently;Follows one step commands with increased time;Follows multi-step commands inconsistently;Follows multi-step commands with increased time Safety/Judgement: Decreased awareness of safety Awareness: Emergent Problem Solving: Slow processing;Requires verbal cues General Comments: Continue to require increased time and cues for processing      Exercises General Exercises - Lower Extremity Heel Slides: 15 reps;Both    General Comments General comments (skin integrity, edema, etc.): Son present throughotu sessio nadn confirmed that pt has been during UB/LB exercises daily. BP 155/57 at end of session. SpO2 96-98 throughout      Pertinent Vitals/Pain Pain Assessment: Faces Faces Pain Scale: Hurts a little bit Pain Location: B feet (L>R) - improved with shoes donned Pain Descriptors / Indicators: Grimacing;Guarding;Sore Pain Intervention(s): Limited activity within patient's tolerance;Monitored during session;Repositioned    Home Living                      Prior Function            PT Goals (current goals can now be found in the care plan section) Acute Rehab PT Goals Patient Stated Goal: to return to independence  PT Goal Formulation: With patient Time For Goal Achievement: 12/12/17 Potential to Achieve Goals:  Good Progress towards PT goals: Progressing toward goals    Frequency    Min 3X/week      PT Plan Current plan remains appropriate    Co-evaluation              AM-PAC PT "6 Clicks" Daily Activity  Outcome Measure  Difficulty turning over in bed (including adjusting bedclothes, sheets and blankets)?: Unable Difficulty moving from lying on back to sitting on the side of the bed? : Unable Difficulty sitting down on and standing up from a chair with arms (e.g., wheelchair, bedside commode, etc,.)?: Unable Help needed moving to and from a bed to chair (including a wheelchair)?: A Little Help needed walking in hospital room?: A Little Help needed climbing 3-5 steps with a railing? : A Lot 6 Click Score: 11    End of Session Equipment Utilized During Treatment: Cervical collar;Gait belt Activity Tolerance: Patient tolerated treatment well Patient left: in bed;with call bell/phone within reach;with bed alarm set;with family/visitor present Nurse Communication: Mobility status PT Visit Diagnosis: Unsteadiness on feet (R26.81);Pain;History of falling (Z91.81) Pain - part of body: (neck and feet)     Time: 1320-1400 PT Time Calculation (min) (ACUTE ONLY): 40 min  Charges:  $Gait Training: 23-37 mins $Therapeutic Activity: 23-37 mins $Canalith Rep Proc: 8-22 mins                    G Codes:       Rolinda Roan, PT, DPT Acute Rehabilitation Services Pager: [phone]    Thelma Comp 12/03/2017, 2:11 PM

## 2017-12-03 NOTE — Care Management Note (Signed)
Case Management Note  Patient Details  Name: Sandy Owens MRN: [id number] Date of Birth: May 14, 1924  Subjective/Objective:  Pt admitted on 11/26/17 s/p ground level fall with TBI, multiple facial and oral fractures, and cervical spine transverse process fractures.  PTA, pt very independent; lives at home alone.  She has supportive daughter at bedside.                    Action/Plan: PT/OT consults pending.  CSW and RN Case Manager offered support to pt/ family.  Oral surgeon planning surgical intervention pending neuro clearance.  Will follow progress.    Expected Discharge Date:  12/03/17               Expected Discharge Plan:  IP Rehab Facility  In-House Referral:  Clinical Social Work  Discharge planning Services  CM Consult  Post Acute Care Choice:    Choice offered to:     DME Arranged:    DME Agency:     HH Arranged:    [place] Agency:     Status of Service:  Completed, signed off  If discussed at [employer], dates discussed:    Additional Comments:  12/02/17 J. Oren Section, RN, BSN PT/OT recommending CIR.  Pt with significant pain in feet, limiting ambulation.  This may possibly be related to gout flare; added gout meds today.  Hopeful for improved progress.    12/03/17 J. Adamae Ricklefs, Therapist, sports, BSN Pt medically stable and insurance auth received for CIR admission.  Plan dc to [facility] later today.    Reinaldo Raddle, RN, BSN  Trauma/Neuro ICU Case Manager [phone]

## 2017-12-03 NOTE — Progress Notes (Signed)
Jamse Arn, MD  Physician  Physical Medicine and Rehabilitation  Consult Note  Signed  Date of Service:  12/01/2017 6:54 AM       Related encounter: ED to Hosp-Admission (Current) from 11/26/2017 in [place]      Expand All Collapse All      [] Hide copied text  [] Hover for details        Physical Medicine and Rehabilitation Consult Reason for Consult: Decreased functional mobility Referring Physician: Trauma services   HPI: Sandy Owens is a 84 y.o. right-handed female with history of left breast cancer with mastectomy, CAD with CABG 2001, hypertension, diabetes mellitus.  Presented 11/26/2017 after ground-level fall.  Per chart review and daughter, patient lives alone.  Multilevel home.  Reports she does not drive due to poor vision.  She manages her own bills and finances.  She has a daughter in the area that checks on her regularly.  There is a son from out of town who plans to provide assistance as needed.  Cranial CT reviewed, showing SAH.  Per report, Cervical spine and maxillofacial films revealed acute moderate volume subarachnoid hemorrhage.  Acute open fracture through anterior maxilla including alveolar ridge with teeth 7, 8 and 9 avulsion fractures.  Acute right nasal bone, osseous nasal septum fracture.  Facial contusions and a 4 x 4 x 2.5 cm right anterior facial hematoma.  Acute nondisplaced C4 and C5 spinous process fractures.  Conservative care of traumatic subarachnoid hemorrhage.  Underwent extraction of multiple teeth 11/28/2017.  Soft collar for C4 and C5 spinous process fractures for comfort.  Incidental findings of right carotid stenosis await plan for CTA of neck.  Hospital course pain management.  Maintain on a soft diet.  Follow cranial CT scan of the head 11/27/2017 stable.  Daughter notes patient recently received pain meds.  She also believes patient's medical and functional condition are declining.   Therapy evaluations completed with recommendations of physical medicine rehab consult.   Review of Systems  Constitutional: Negative for diaphoresis and fever.  HENT: Negative for hearing loss.   Eyes: Positive for blurred vision.  Respiratory: Negative for cough and shortness of breath.   Cardiovascular: Negative for chest pain and palpitations.  Gastrointestinal: Positive for constipation. Negative for nausea.  Genitourinary: Negative for dysuria, flank pain and hematuria.  Musculoskeletal: Positive for falls, joint pain and myalgias.  Neurological: Negative for seizures.  All other systems reviewed and are negative.      Past Medical History:  Diagnosis Date  . Cancer [facility])    left breast  . Coronary artery disease    CABG - 3- 2001  . Diabetes mellitus without complication ([place])   . Hypertension   . Hypothyroidism   . Neuromuscular disorder ([place])    neuropathy in feet  and legs        Past Surgical History:  Procedure Laterality Date  . ABDOMINAL HYSTERECTOMY    . APPENDECTOMY    . BACK SURGERY    . cabg    . HIP SURGERY      right hip replacement  . JOINT REPLACEMENT     left knee replacement  . MASTECTOMY Left   . SIMPLE MASTECTOMY WITH AXILLARY SENTINEL NODE BIOPSY Left 06/06/2014   Procedure: LEFT SIMPLE MASTECTOMY;  Surgeon: Excell Seltzer, MD;  Location: [place];  Service: General;  Laterality: Left;        Family History  Problem Relation Age of Onset  . Breast cancer Neg Hx    Social History:  reports that she has quit smoking. She has never used smokeless tobacco. She reports that she drank alcohol. She reports that she does not use drugs. Allergies: No Known Allergies       Medications Prior to Admission  Medication Sig Dispense Refill  . amLODipine (NORVASC) 5 MG tablet 5 mg daily.     Marland Kitchen aspirin 81 MG tablet Take 81 mg by mouth daily.    Marland Kitchen atenolol (TENORMIN) 50 MG tablet 50 mg 2 (two) times  daily.     . Calcium Carbonate-Vit D-Min (GNP CALCIUM PLUS 600 +D PO) Take 1 tablet by mouth.    . colchicine 0.6 MG tablet Take 0.6 mg by mouth daily.    Marland Kitchen glimepiride (AMARYL) 1 MG tablet 1 mg daily with breakfast.     . levothyroxine (SYNTHROID, LEVOTHROID) 88 MCG tablet 88 mcg daily.     Marland Kitchen lisinopril (PRINIVIL,ZESTRIL) 5 MG tablet 5 mg daily.     . Multiple Vitamin (MULTIVITAMIN) tablet Take 1 tablet by mouth daily.    . naproxen sodium (ALEVE) 220 MG tablet Take 2 tablets every 12 hours until gout flare resolves.    . Omega-3 Fatty Acids (FISH OIL PO) Take 1 tablet by mouth 2 (two) times daily.    Marland Kitchen triamterene-hydrochlorothiazide (MAXZIDE-25) 37.5-25 MG per tablet daily.     Marland Kitchen ADVOCATE REDI-CODE test strip     . HYDROcodone-acetaminophen (NORCO/VICODIN) 5-325 MG per tablet Take 1-2 tablets by mouth every 4 (four) hours as needed for moderate pain. (Patient not taking: Reported on 11/26/2017) 40 tablet 0  . traMADol (ULTRAM) 50 MG tablet Take 1 tablet (50 mg total) by mouth every 6 (six) hours as needed. (Patient not taking: Reported on 11/26/2017) 6 tablet 0  . UNABLE TO FIND Med Name: Left Mastectomy Supplies  Diagnosis:  C50.912, Z90.12 (Patient not taking: Reported on 11/26/2017) 1 each 3    Home: Home Living Family/patient expects to be discharged to:: Inpatient rehab Living Arrangements: Alone Available Help at Discharge: Family, Available PRN/intermittently Type of Home: House Home Layout: Laundry or work area in basement, Designer, industrial/product of Steps: 14 Alternate Level Stairs-Rails: Right  Functional History: Prior Function Level of Independence: Independent Comments: reports she does not drive due to poor vision at baseline, she does not normally walk with an AD, she manages her bills with help from her daughter and she uses a pill box for her meds.  Functional Status:  Mobility: Bed Mobility Overal bed mobility: Needs  Assistance Bed Mobility: Supine to Sit, Sit to Supine Rolling: Min assist Supine to sit: Total assist, HOB elevated Sit to supine: Total assist, HOB elevated General bed mobility comments: Pt assisted very little - required assist with all aspects  Transfers Overall transfer level: Needs assistance Equipment used: 1 person hand held assist Transfers: Sit to/from Stand Sit to Stand: Total assist General transfer comment: attempted to stand x 2, however pt unable to do so - collapses forward, fully leaning into therapist, and does not attempt further  Ambulation/Gait Ambulation/Gait assistance: Min assist Ambulation Distance (Feet): 5 Feet Assistive device: 1 person hand held assist Gait Pattern/deviations: Step-to pattern General Gait Details: side step to [facility] to be in a better position to return to supine.  Pt did not want to get up OOB to chair yet.   ADL: ADL Overall ADL's : Needs assistance/impaired Eating/Feeding: Maximal assistance, Sitting Grooming: Wash/dry  hands, Wash/dry face, Brushing hair, Maximal assistance, Sitting Upper Body Bathing: Total assistance, Sitting Lower Body Bathing: Total assistance, Bed level Upper Body Dressing : Total assistance, Bed level Lower Body Dressing: Total assistance, Bed level Toilet Transfer: Total assistance Toilet Transfer Details (indicate cue type and reason): unable Toileting- Clothing Manipulation and Hygiene: Total assistance, Bed level Functional mobility during ADLs: Total assistance General ADL Comments: Pt limited by lethargy and pain   Cognition: Cognition Overall Cognitive Status: Impaired/Different from baseline Orientation Level: Oriented X4 Cognition Arousal/Alertness: Lethargic Behavior During Therapy: Flat affect Overall Cognitive Status: Impaired/Different from baseline Area of Impairment: Attention, Memory, Following commands, Awareness, Problem solving Current Attention Level: Sustained Memory: Decreased  short-term memory Following Commands: Follows one step commands consistently, Follows one step commands with increased time Awareness: Intellectual Problem Solving: Slow processing, Decreased initiation, Difficulty sequencing, Requires verbal cues, Requires tactile cues General Comments: Pt is lethargic today.  She is slow to respond   Blood pressure (!) 129/46, pulse 65, temperature 97.9 F (36.6 C), temperature source Oral, resp. rate (!) 25, height 5' (1.524 m), weight 59.8 kg (131 lb 13.4 oz), SpO2 97 %. Physical Exam  Vitals reviewed. Constitutional: She appears well-developed and well-nourished.  HENT:  Multiple bruises to the face with swelling and ecchymosis  Eyes: EOM are normal. Right eye exhibits no discharge. Left eye exhibits no discharge.  Pupils sluggish but reactive to light  Neck:  Soft cervical collar in place  Cardiovascular: Normal rate and regular rhythm.  Respiratory:  Limited inspiratory effort but clear to auscultation +[place]  GI: Soft. Bowel sounds are normal. She exhibits no distension.  Musculoskeletal:  Facial edema and tenderness Tenderness b/l feet  Neurological: She is alert.  She provides her name and age.   Some mild delay in processing.   Follows simple commands Motor: RUE: 4-/5 proximal to distal LUE: 3-/5 proximal to distal LLE: 3-/5 proximal to distal RLE: 3-/5 proximal to distal  Skin:  Healing abrasions  Psychiatric: Her affect is blunt. Her speech is delayed. She is slowed.    LabResultsLast24Hours       Results for orders placed or performed during the hospital encounter of 11/26/17 (from the past 24 hour(s))  Glucose, capillary     Status: Abnormal   Collection Time: 11/30/17 11:44 AM  Result Value Ref Range   Glucose-Capillary 148 (H) 65 - 99 mg/dL  Urinalysis, Routine w reflex microscopic     Status: Abnormal   Collection Time: 11/30/17  1:02 PM  Result Value Ref Range   Color, Urine YELLOW YELLOW   APPearance HAZY  (A) CLEAR   Specific Gravity, Urine 1.016 1.005 - 1.030   pH 5.0 5.0 - 8.0   Glucose, UA NEGATIVE NEGATIVE mg/dL   Hgb urine dipstick SMALL (A) NEGATIVE   Bilirubin Urine NEGATIVE NEGATIVE   Ketones, ur 5 (A) NEGATIVE mg/dL   Protein, ur 30 (A) NEGATIVE mg/dL   Nitrite NEGATIVE NEGATIVE   Leukocytes, UA LARGE (A) NEGATIVE   RBC / HPF 6-30 0 - 5 RBC/hpf   WBC, UA TOO NUMEROUS TO COUNT 0 - 5 WBC/hpf   Bacteria, UA RARE (A) NONE SEEN   Squamous Epithelial / LPF 0-5 (A) NONE SEEN   Mucus PRESENT    Non Squamous Epithelial 0-5 (A) NONE SEEN  Glucose, capillary     Status: Abnormal   Collection Time: 11/30/17  5:54 PM  Result Value Ref Range   Glucose-Capillary 199 (H) 65 - 99 mg/dL  Glucose, capillary  Status: Abnormal   Collection Time: 11/30/17  8:53 PM  Result Value Ref Range   Glucose-Capillary 200 (H) 65 - 99 mg/dL  CBC     Status: Abnormal   Collection Time: 12/01/17  3:43 AM  Result Value Ref Range   WBC 14.7 (H) 4.0 - 10.5 K/uL   RBC 2.93 (L) 3.87 - 5.11 MIL/uL   Hemoglobin 9.3 (L) 12.0 - 15.0 g/dL   HCT 28.1 (L) 36.0 - 46.0 %   MCV 95.9 78.0 - 100.0 fL   MCH 31.7 26.0 - 34.0 pg   MCHC 33.1 30.0 - 36.0 g/dL   RDW 13.8 11.5 - 15.5 %   Platelets 230 150 - 400 K/uL  Basic metabolic panel     Status: Abnormal   Collection Time: 12/01/17  3:43 AM  Result Value Ref Range   Sodium 132 (L) 135 - 145 mmol/L   Potassium 4.2 3.5 - 5.1 mmol/L   Chloride 101 101 - 111 mmol/L   CO2 21 (L) 22 - 32 mmol/L   Glucose, Bld 134 (H) 65 - 99 mg/dL   BUN 18 6 - 20 mg/dL   Creatinine, Ser 1.13 (H) 0.44 - 1.00 mg/dL   Calcium 8.0 (L) 8.9 - 10.3 mg/dL   GFR calc non Af Amer 41 (L) >60 mL/min   GFR calc Af Amer 47 (L) >60 mL/min   Anion gap 10 5 - 15      ImagingResults(Last48hours)  Dg Chest 2 View  Result Date: 11/30/2017 CLINICAL DATA:  Hypoxia. EXAM: CHEST - 2 VIEW COMPARISON:  06/03/2007 FINDINGS: Low volume chest which  accentuates borderline heart size. Low volumes with streaky density especially behind the heart. No Kerley lines, effusion, or pneumothorax. Status post CABG. IMPRESSION: Low volume chest with left lower lobe atelectasis or pneumonia. Electronically Signed   By: Monte Fantasia M.D.   On: 11/30/2017 12:04     Assessment/Plan: Diagnosis: Polytrauma with TBI Labs and images independently reviewed.  Records reviewed and summated above.  1. Does the need for close, 24 hr/day medical supervision in concert with the patient's rehab needs make it unreasonable for this patient to be served in a less intensive setting? Yes  2. Co-Morbidities requiring supervision/potential complications: left breast cancer with mastectomy, CAD with CABG (cont meds), HTN (monitor and provide prns in accordance with increased physical exertion and pain), diabetes mellitus with peripheral neuropathy (Monitor in accordance with exercise and adjust meds as necessary), gout (?flare, monitor LE), pain management (Biofeedback training with therapies to help reduce reliance on opiate pain medications, monitor pain control during therapies, and sedation at rest and titrate to maximum efficacy to ensure participation and gains in therapies), dysphagia (advance diet as tolerated), tachypnea (monitor RR and O2 Sats with increased physical exertion), hypothyroidism (cont meds, ensure appropriate mood and energy level for therapies), hyponatremia (cont to monitor, treat if necessary), leukocytosis (cont to monitor for signs and symptoms of infection, further workup if indicated), ABLA (transfuse if necessary to ensure appropriate perfusion for increased activity tolerance), CKD (avoid nephrotoxic meds) 3. Due to safety, skin/wound care, disease management, pain management and patient education, does the patient require 24 hr/day rehab nursing? Yes 4. Does the patient require coordinated care of a physician, rehab nurse, PT (1-2 hrs/day, 5  days/week), OT (1-2 hrs/day, 5 days/week) and SLP (1-2 hrs/day, 5 days/week) to address physical and functional deficits in the context of the above medical diagnosis(es)? Yes Addressing deficits in the following areas: balance, endurance, locomotion, strength, transferring,  bathing, dressing, toileting, speech, swallowing and psychosocial support 5. Can the patient actively participate in an intensive therapy program of at least 3 hrs of therapy per day at least 5 days per week? Potentially 6. The potential for patient to make measurable gains while on inpatient rehab is excellent 7. Anticipated functional outcomes upon discharge from inpatient rehab are supervision  with PT, supervision with OT, modified independent and supervision with SLP. 8. Estimated rehab length of stay to reach the above functional goals is: 12-17 days. 9. Anticipated D/C setting: Home 10. Anticipated post D/C treatments: HH therapy and Home excercise program 11. Overall Rehab/Functional Prognosis: excellent  RECOMMENDATIONS: This patient's condition is appropriate for continued rehabilitative care in the following setting: CIR once medical workup complete if caregiver support available upon discharge. Patient has agreed to participate in recommended program. Potentially Note that insurance prior authorization may be required for reimbursement for recommended care.  Comment: Rehab Admissions Coordinator to follow up.  Delice Lesch, MD, ABPMR Lavon Paganini Angiulli, PA-C 12/01/2017          Revision History                        Routing History

## 2017-12-03 NOTE — Progress Notes (Signed)
Cristina Gong, RN  Rehab Admission Coordinator  Physical Medicine and Rehabilitation  PMR Pre-admission  Signed  Date of Service:  12/03/2017 2:23 PM       Related encounter: ED to Hosp-Admission (Current) from 11/26/2017 in [place]           [] Hide copied text  [] Hover for details   PMR Admission Coordinator Pre-Admission Assessment  Patient: Sandy Owens is an 84 y.o., female MRN: [id number] DOB: 20-Oct-1923 Height: 5' (152.4 cm) Weight: 59.8 kg (131 lb 13.4 oz)                                                                                                                                                  Insurance Information HMO: yes    PPO:      PCP:      IPA:      80/20:      OTHER: Medicare advantage plan PRIMARY: [employer]      Policy#: [id number]      Subscriber: pt CM Name: Shirlean Mylar      Phone#: [phone] ext [phone]     Fax#: [phone] Pre-Cert#: [id number]  For 7 days with f/u Carriece W. At ext [phone]     Employer: retired Benefits:  Phone #: [phone]     Name: 12/02/17 Eff. Date: 09/02/2017     Deduct: none      Out of Pocket Max: $3400      Life Max: none CIR: $295 co pay per days 1 until 6       SNF: no co pay days 1 until 20; $172 co pay per day days 21 until 100 Outpatient: $40 co pay per visit     Co-Pay: visits per medical neccesity Home Health: 100%      Co-Pay: visits per medical neccesity DME: 80%     Co-Pay: 20% Providers: in network  SECONDARY: none       Medicaid Application Date:      Case Manager:  Disability Application Date:       Case Worker:   Emergency Contact Information         Contact Information    Name Relation Home Work Mobile   Bush,Donna Daughter   [phone]     Current Medical History  Patient Admitting Diagnosis: polytrauma with TBI History of Present Illness:  JOA:CZYSA H Holenbeckis a 84 y.o.right-handed femalewith history of left breast cancer  with mastectomy, CAD with CABG 2001, hypertension, diabetes mellitus. Presented 11/26/2017 after ground-level fall. Cranial CT reviewed, showing SAH. Per report, Cervical spine and maxillofacial films revealed acute moderate volume subarachnoid hemorrhage. Acute open fracture through anterior maxilla including alveolar ridge with teeth 7, 8 and 9 avulsion fractures. Acute right nasal bone, osseous nasal septum fracture. Facial contusions and a 4  x 4 x 2.5 cm right anterior facial hematoma. Acute nondisplaced C4 and C5 spinous process fractures. Conservative care of traumatic subarachnoid hemorrhage. Underwent extraction of multiple teeth 11/28/2017. Soft collar for C4 and C5 spinous process fractures for comfort. Incidental findings of right carotid stenosis await plan for CTA of neckversus follow-up as outpatient. Hospital course pain management.Acute blood loss anemia 8.9 and monitored.Follow cranial CT scan of the head 11/27/2017 stable.I will follow-up x-rays of right wrist hand as well as left wrist and hand unremarkable. There was a lucency of right index finger likely felt to be gout no plan for MRI at this time. Subcutaneous Lovenox added for DVT prophylaxis 12/01/2017. Patient with bouts of urinary retention placed on low-dose Urecholine. Dysphasia #1 thin liquid diet.  Past Medical History      Past Medical History:  Diagnosis Date  . Cancer [facility])    left breast  . Coronary artery disease    CABG - 3- 2001  . Diabetes mellitus without complication ([place])   . Hypertension   . Hypothyroidism   . Neuromuscular disorder ([place])    neuropathy in feet  and legs    Family History  family history is not on file.  Prior Rehab/Hospitalizations:  Has the patient had major surgery during 100 days prior to admission? No  Current Medications   Current Facility-Administered Medications:  .  acetaminophen (TYLENOL) tablet 650 mg, 650 mg, Oral, Q6H, Focht, Jessica  L, PA, 650 mg at 12/03/17 0955 .  amLODipine (NORVASC) tablet 5 mg, 5 mg, Oral, Daily, Romana Juniper A, MD, 5 mg at 12/03/17 0954 .  aspirin EC tablet 81 mg, 81 mg, Oral, Daily, Focht, Jessica L, PA, 81 mg at 12/03/17 0955 .  atenolol (TENORMIN) tablet 50 mg, 50 mg, Oral, BID, Romana Juniper A, MD, 50 mg at 11/30/17 2232 .  bethanechol (URECHOLINE) tablet 5 mg, 5 mg, Oral, TID, Focht, Jessica L, PA, 5 mg at 12/03/17 0955 .  chlorhexidine (PERIDEX) 0.12 % solution 15 mL, 15 mL, Mouth Rinse, BID, Romana Juniper A, MD, 15 mL at 12/03/17 0954 .  colchicine tablet 0.3 mg, 0.3 mg, Oral, Daily, Focht, Jessica L, PA, 0.3 mg at 12/03/17 0958 .  enoxaparin (LOVENOX) injection 30 mg, 30 mg, Subcutaneous, Q24H, Focht, Jessica L, PA .  gabapentin (NEURONTIN) capsule 100 mg, 100 mg, Oral, BID, Focht, Jessica L, PA, 100 mg at 12/03/17 0954 .  insulin aspart (novoLOG) injection 0-9 Units, 0-9 Units, Subcutaneous, TID WC, Rolm Bookbinder, MD, 2 Units at 12/03/17 1257 .  levothyroxine (SYNTHROID, LEVOTHROID) tablet 88 mcg, 88 mcg, Oral, QAC breakfast, Romana Juniper A, MD, 88 mcg at 12/03/17 0854 .  lisinopril (PRINIVIL,ZESTRIL) tablet 5 mg, 5 mg, Oral, Daily, Romana Juniper A, MD, 5 mg at 12/03/17 0954 .  MEDLINE mouth rinse, 15 mL, Mouth Rinse, q12n4p, Romana Juniper A, MD, 15 mL at 12/03/17 1256 .  morphine 4 MG/ML injection 0.52 mg, 0.52 mg, Intravenous, Q1H PRN, Romana Juniper A, MD .  naproxen sodium (ANAPROX) tablet 275 mg, 275 mg, Oral, BID WC, Focht, Jessica L, PA, 275 mg at 12/03/17 0858 .  ondansetron (ZOFRAN-ODT) disintegrating tablet 4 mg, 4 mg, Oral, Q6H PRN **OR** ondansetron (ZOFRAN) injection 4 mg, 4 mg, Intravenous, Q6H PRN, Clovis Riley, MD, 4 mg at 11/26/17 2159 .  oxyCODONE (Oxy IR/ROXICODONE) immediate release tablet 2.5 mg, 2.5 mg, Oral, Q4H PRN, Focht, Jessica L, PA .  phenol (CHLORASEPTIC) mouth spray 1 spray, 1 spray, Mouth/Throat, PRN, Rayburn, Floyce Stakes,  PA-C, 1 spray at  11/28/17 1748 .  traMADol (ULTRAM) tablet 50 mg, 50 mg, Oral, Q6H PRN, Clovis Riley, MD, 50 mg at 11/28/17 1611  Patients Current Diet: DIET DYS 2 Room service appropriate? Yes; Fluid consistency: Thin  Precautions / Restrictions Precautions Precautions: Fall Precaution Comments: Low vision at baseline; BPPV Restrictions Weight Bearing Restrictions: No   Has the patient had 2 or more falls or a fall with injury in the past year?No  Prior Activity Level Community (5-7x/wk): Did not drive due to left eye blindness pta  Home Assistive Devices / Equipment  Prior Device Use: Indicate devices/aids used by the patient prior to current illness, exacerbation or injury? None of the above  Prior Functional Level Prior Function Level of Independence: Independent Comments: reports she does not drive due to poor vision at baseline, she does not normally walk with an AD, she manages her bills with help from her daughter and she uses a pill box for her meds.   Self Care: Did the patient need help bathing, dressing, using the toilet or eating?  Independent  Indoor Mobility: Did the patient need assistance with walking from room to room (with or without device)? Independent  Stairs: Did the patient need assistance with internal or external stairs (with or without device)? Independent  Functional Cognition: Did the patient need help planning regular tasks such as shopping or remembering to take medications? Needed some help  Current Functional Level Cognition  Overall Cognitive Status: Impaired/Different from baseline Current Attention Level: Selective Orientation Level: Oriented X4 Following Commands: Follows one step commands consistently, Follows one step commands with increased time, Follows multi-step commands inconsistently, Follows multi-step commands with increased time Safety/Judgement: Decreased awareness of safety General Comments: Continue to require increased  time and cues for processing    Extremity Assessment (includes Sensation/Coordination)  Upper Extremity Assessment: LUE deficits/detail, Generalized weakness LUE Deficits / Details: Pt with active shoulder flexion to ~80* (she reports questionable difficulty with Lt shoulder PTA, but inconsistent with info).  AAROM WFL   Lower Extremity Assessment: Defer to PT evaluation    ADLs  Overall ADL's : Needs assistance/impaired Eating/Feeding: Sitting, Set up, Supervision/ safety Eating/Feeding Details (indicate cue type and reason): Pt sitting at EOB with supervision for safety. Pt able to hold spoon and bring to mouth, Noted compensatory hiking of shoulder (RUE) to bring spoon to mouth. Pt with poor lip closure when drinking from a straw; providing Min cues for closing lips after each sip of drink through a straw.  Grooming: Wash/dry face, Minimal assistance, Standing Grooming Details (indicate cue type and reason): Min A for standing balance. Pt requiring increased time and cues for sequencing. Performing oral care and washing her face and requiring either single hand on sink or hips against sink for balance. Pt stating her back starting to hurt and requesting to sit down.  Upper Body Bathing: Total assistance, Sitting Lower Body Bathing: Total assistance, Bed level Upper Body Dressing : Total assistance, Bed level Lower Body Dressing: Maximal assistance, Sit to/from stand Lower Body Dressing Details (indicate cue type and reason): Max A to don socks  Toilet Transfer: Maximal assistance, +2 for physical assistance Toilet Transfer Details (indicate cue type and reason): Pt performing stand pivot transfer to recliner with Max A +2. Pt motivated to participate in therapy despite significant pain in bilateral feet. Pt requiring Min A to facilitate lateral movement of LLE and has difficulty putting weight thorugh LLE due to pain.  Toileting- Clothing Manipulation and  Hygiene: Total assistance, Bed  level Functional mobility during ADLs: Minimal assistance, +2 for safety/equipment, Rolling walker General ADL Comments: Pt highly motivated to participate in therapy and return to PLOF. Pt eating upon arrival and stating "no I can wait. I want to work"    Mobility  Overal bed mobility: Needs Assistance Bed Mobility: Supine to Sit Rolling: Min guard, Min assist Supine to sit: HOB elevated, Min assist Sit to supine: Min assist General bed mobility comments: Assist provided for frequent position changes for vestibular testing    Transfers  Overall transfer level: Needs assistance Equipment used: Rolling walker (2 wheeled) Transfers: Sit to/from Stand Sit to Stand: Min assist Stand pivot transfers: Min assist  Lateral/Scoot Transfers: Mod assist, +2 physical assistance General transfer comment: Assist for boost to stand, and for balance support when takin gpivotal steps around to the bed.     Ambulation / Gait / Stairs / Wheelchair Mobility  Ambulation/Gait Ambulation/Gait assistance: Min assist, +2 safety/equipment Ambulation Distance (Feet): 60 Feet(30'; seated rest; 30') Assistive device: Rolling walker (2 wheeled) Gait Pattern/deviations: Decreased stride length, Shuffle, Trunk flexed General Gait Details: With shoes donned, pt was able to ambulate fairly well with RW for support. Assist provided for balance and occasional walker management. Family member provided chair follow, and pt took 1 seated rest break before ambulating back to the room. Pt reports SOB and continued "woozy" feeling while ambulating, and was instructed in pursed-lip breathing. Sats improved from 93-98% on RA during seated rest. Gait velocity: Decreased Gait velocity interpretation: Below normal speed for age/gender    Posture / Balance Dynamic Sitting Balance Sitting balance - Comments: Able to maintain sitting at EOB during self feeding Balance Overall balance assessment: Needs  assistance Sitting-balance support: Feet supported, No upper extremity supported Sitting balance-Leahy Scale: Fair Sitting balance - Comments: Able to maintain sitting at EOB during self feeding Postural control: Posterior lean, Right lateral lean Standing balance support: During functional activity, Single extremity supported Standing balance-Leahy Scale: Poor Standing balance comment: Reliant on UE support during functional mobility.     Special needs/care consideration BiPAP/CPAP  N/a CPM  N/a Continuous Drip IV n/a Dialysis  N/a Life Vest  N/a Oxygen n/a Special Bed  N/a Trach Size  N/a Wound Vac/a Skin lip laceration; ecchymosis to face, chest, arm and hand Bowel mgmt: no LBM documented Bladder mgmt: continent Diabetic mgmt yes pta Legally blind left eye   Previous Home Environment Living Arrangements: Alone  Lives With: Alone Available Help at Discharge: Family, Available 24 hours/day(son and daughter to arrange 24/7 supervision at d/c) Type of Home: House Home Layout: Laundry or work area in basement, Multi-level Alternate Level Stairs-Rails: Right Alternate Level Stairs-Number of Steps: [place]: No  Discharge Living Setting Plans for Discharge Living Setting: Patient's home Type of Home at Discharge: House Discharge Home Layout: Multi-level, Laundry or work area in basement Alternate Level Stairs-Rails: Right Alternate Level Stairs-Number of Steps: 14 Does the patient have any problems obtaining your medications?: No  Social/Family/Support Systems Patient Roles: Manufacturing systems engineer Information: [name], daughter Anticipated Caregiver: daughter and son Anticipated Caregiver's Contact Information: see above Ability/Limitations of Caregiver: none Caregiver Availability: 24/7 Discharge Plan Discussed with Primary Caregiver: Yes Is Caregiver In Agreement with Plan?: Yes Does Caregiver/Family have Issues with Lodging/Transportation while Pt is in  Rehab?: No  Goals/Additional Needs Patient/Family Goal for Rehab: supervision with PT and OT Expected length of stay: ELOS 12 to 17 days Pt/Family Agrees to Admission and willing to participate:  Yes Program Orientation Provided & Reviewed with Pt/Caregiver Including Roles  & Responsibilities: Yes  Decrease burden of Care through IP rehab admission: n/a  Possible need for SNF placement upon discharge:not anticipated  Patient Condition: This patient's medical and functional status has changed since the consult dated: 12/01/2017 in which the Rehabilitation Physician determined and documented that the patient's condition is appropriate for intensive rehabilitative care in an inpatient rehabilitation facility. See "History of Present Illness" (above) for medical update. Functional changes are: min to mod assist. Patient's medical and functional status update has been discussed with the Rehabilitation physician and patient remains appropriate for inpatient rehabilitation. Will admit to inpatient rehab today.  Preadmission Screen Completed By:  Cleatrice Burke, 12/03/2017 3:51 PM ______________________________________________________________________   Discussed status with Dr. Posey Pronto on 12/03/2017 at  1551 and received telephone approval for admission today.  Admission Coordinator:  Cleatrice Burke, time 1950 Date 12/03/2017             Cosigned by: Jamse Arn, MD at 12/03/2017 3:52 PM  Revision History

## 2017-12-03 NOTE — PMR Pre-admission (Signed)
PMR Admission Coordinator Pre-Admission Assessment  Patient: Sandy Owens is an 84 y.o., female MRN: [id number] DOB: [birth year] Height: 5' (152.4 cm) Weight: 59.8 kg (131 lb 13.4 oz)              Insurance Information HMO: yes    PPO:      PCP:      IPA:      80/20:      OTHER: Medicare advantage plan PRIMARY: [employer]      Policy#: [id number]      Subscriber: pt CM Name: Sandy Owens      Phone#: [phone] ext [phone]     Fax#: [phone] Pre-Cert#: [id number]  For 7 days with f/u Carriece W. At ext [phone]     Employer: retired Benefits:  Phone #: [phone]     Name: 12/02/17 Eff. Date: 09/02/2017     Deduct: none      Out of Pocket Max: $3400      Life Max: none CIR: $295 co pay per days 1 until 6       SNF: no co pay days 1 until 20; $172 co pay per day days 21 until 100 Outpatient: $40 co pay per visit     Co-Pay: visits per medical neccesity Home Health: 100%      Co-Pay: visits per medical neccesity DME: 80%     Co-Pay: 20% Providers: in network  SECONDARY: none       Medicaid Application Date:      Case Manager:  Disability Application Date:       Case Worker:   Emergency Contact Information Contact Information    Name Relation Home Work Mobile   [place] Daughter   [phone]     Current Medical History  Patient Admitting Diagnosis: polytrauma with TBI History of Present Illness:  HPI: Sandy Owens a 84 y.o.right-handed femalewith history of left breast cancer with mastectomy, CAD with CABG 2001, hypertension, diabetes mellitus. Presented 11/26/2017 after ground-level fall. Cranial CT reviewed, showing SAH. Per report, Cervical spine and maxillofacial films revealed acute moderate volume subarachnoid hemorrhage. Acute open fracture through anterior maxilla including alveolar ridge with teeth 7, 8 and 9 avulsion fractures. Acute right nasal bone, osseous nasal septum fracture. Facial contusions and a 4 x 4 x 2.5 cm right anterior facial hematoma.  Acute nondisplaced C4 and C5 spinous process fractures. Conservative care of traumatic subarachnoid hemorrhage. Underwent extraction of multiple teeth 11/28/2017. Soft collar for C4 and C5 spinous process fractures for comfort. Incidental findings of right carotid stenosis await plan for CTA of neck versus follow-up as outpatient. Hospital course pain management.  Acute blood loss anemia 8.9 and monitored. Follow cranial CT scan of the head 11/27/2017 stable.  I will follow-up x-rays of right wrist hand as well as left wrist and hand unremarkable.  There was a lucency of right index finger likely felt to be gout no plan for MRI at this time.  Subcutaneous Lovenox added for DVT prophylaxis 12/01/2017.  Patient with bouts of urinary retention placed on low-dose Urecholine.  Dysphasia #1 thin liquid diet.  Past Medical History  Past Medical History:  Diagnosis Date  . Cancer [facility])    left breast  . Coronary artery disease    CABG - 3- 2001  . Diabetes mellitus without complication ([place])   . Hypertension   . Hypothyroidism   . Neuromuscular disorder (HCC)    neuropathy in feet  and legs    Family History  family history is  not on file.  Prior Rehab/Hospitalizations:  Has the patient had major surgery during 100 days prior to admission? No  Current Medications   Current Facility-Administered Medications:  .  acetaminophen (TYLENOL) tablet 650 mg, 650 mg, Oral, Q6H, Focht, Jessica L, PA, 650 mg at 12/03/17 0955 .  amLODipine (NORVASC) tablet 5 mg, 5 mg, Oral, Daily, Romana Juniper A, MD, 5 mg at 12/03/17 0954 .  aspirin EC tablet 81 mg, 81 mg, Oral, Daily, Focht, Jessica L, PA, 81 mg at 12/03/17 0955 .  atenolol (TENORMIN) tablet 50 mg, 50 mg, Oral, BID, Romana Juniper A, MD, 50 mg at 11/30/17 2232 .  bethanechol (URECHOLINE) tablet 5 mg, 5 mg, Oral, TID, Focht, Jessica L, PA, 5 mg at 12/03/17 0955 .  chlorhexidine (PERIDEX) 0.12 % solution 15 mL, 15 mL, Mouth Rinse, BID, Romana Juniper A, MD, 15 mL at 12/03/17 0954 .  colchicine tablet 0.3 mg, 0.3 mg, Oral, Daily, Focht, Jessica L, PA, 0.3 mg at 12/03/17 0958 .  enoxaparin (LOVENOX) injection 30 mg, 30 mg, Subcutaneous, Q24H, Focht, Jessica L, PA .  gabapentin (NEURONTIN) capsule 100 mg, 100 mg, Oral, BID, Focht, Jessica L, PA, 100 mg at 12/03/17 0954 .  insulin aspart (novoLOG) injection 0-9 Units, 0-9 Units, Subcutaneous, TID WC, Rolm Bookbinder, MD, 2 Units at 12/03/17 1257 .  levothyroxine (SYNTHROID, LEVOTHROID) tablet 88 mcg, 88 mcg, Oral, QAC breakfast, Romana Juniper A, MD, 88 mcg at 12/03/17 0854 .  lisinopril (PRINIVIL,ZESTRIL) tablet 5 mg, 5 mg, Oral, Daily, Romana Juniper A, MD, 5 mg at 12/03/17 0954 .  MEDLINE mouth rinse, 15 mL, Mouth Rinse, q12n4p, Romana Juniper A, MD, 15 mL at 12/03/17 1256 .  morphine 4 MG/ML injection 0.52 mg, 0.52 mg, Intravenous, Q1H PRN, Romana Juniper A, MD .  naproxen sodium (ANAPROX) tablet 275 mg, 275 mg, Oral, BID WC, Focht, Jessica L, PA, 275 mg at 12/03/17 0858 .  ondansetron (ZOFRAN-ODT) disintegrating tablet 4 mg, 4 mg, Oral, Q6H PRN **OR** ondansetron (ZOFRAN) injection 4 mg, 4 mg, Intravenous, Q6H PRN, Clovis Riley, MD, 4 mg at 11/26/17 2159 .  oxyCODONE (Oxy IR/ROXICODONE) immediate release tablet 2.5 mg, 2.5 mg, Oral, Q4H PRN, Focht, Jessica L, PA .  phenol (CHLORASEPTIC) mouth spray 1 spray, 1 spray, Mouth/Throat, PRN, Rayburn, Kelly A, PA-C, 1 spray at 11/28/17 1748 .  traMADol (ULTRAM) tablet 50 mg, 50 mg, Oral, Q6H PRN, Clovis Riley, MD, 50 mg at 11/28/17 1611  Patients Current Diet: DIET DYS 2 Room service appropriate? Yes; Fluid consistency: Thin  Precautions / Restrictions Precautions Precautions: Fall Precaution Comments: Low vision at baseline; BPPV Restrictions Weight Bearing Restrictions: No   Has the patient had 2 or more falls or a fall with injury in the past year?No  Prior Activity Level Community (5-7x/wk): Did not drive due to  left eye blindness pta  Home Assistive Devices / Equipment    Prior Device Use: Indicate devices/aids used by the patient prior to current illness, exacerbation or injury? None of the above  Prior Functional Level Prior Function Level of Independence: Independent Comments: reports she does not drive due to poor vision at baseline, she does not normally walk with an AD, she manages her bills with help from her daughter and she uses a pill box for her meds.   Self Care: Did the patient need help bathing, dressing, using the toilet or eating?  Independent  Indoor Mobility: Did the patient need assistance with walking from room to room (with  or without device)? Independent  Stairs: Did the patient need assistance with internal or external stairs (with or without device)? Independent  Functional Cognition: Did the patient need help planning regular tasks such as shopping or remembering to take medications? Needed some help  Current Functional Level Cognition  Overall Cognitive Status: Impaired/Different from baseline Current Attention Level: Selective Orientation Level: Oriented X4 Following Commands: Follows one step commands consistently, Follows one step commands with increased time, Follows multi-step commands inconsistently, Follows multi-step commands with increased time Safety/Judgement: Decreased awareness of safety General Comments: Continue to require increased time and cues for processing    Extremity Assessment (includes Sensation/Coordination)  Upper Extremity Assessment: LUE deficits/detail, Generalized weakness LUE Deficits / Details: Pt with active shoulder flexion to ~80* (she reports questionable difficulty with Lt shoulder PTA, but inconsistent with info).  AAROM WFL   Lower Extremity Assessment: Defer to PT evaluation    ADLs  Overall ADL's : Needs assistance/impaired Eating/Feeding: Sitting, Set up, Supervision/ safety Eating/Feeding Details (indicate cue type  and reason): Pt sitting at EOB with supervision for safety. Pt able to hold spoon and bring to mouth, Noted compensatory hiking of shoulder (RUE) to bring spoon to mouth. Pt with poor lip closure when drinking from a straw; providing Min cues for closing lips after each sip of drink through a straw.  Grooming: Wash/dry face, Minimal assistance, Standing Grooming Details (indicate cue type and reason): Min A for standing balance. Pt requiring increased time and cues for sequencing. Performing oral care and washing her face and requiring either single hand on sink or hips against sink for balance. Pt stating her back starting to hurt and requesting to sit down.  Upper Body Bathing: Total assistance, Sitting Lower Body Bathing: Total assistance, Bed level Upper Body Dressing : Total assistance, Bed level Lower Body Dressing: Maximal assistance, Sit to/from stand Lower Body Dressing Details (indicate cue type and reason): Max A to don socks  Toilet Transfer: Maximal assistance, +2 for physical assistance Toilet Transfer Details (indicate cue type and reason): Pt performing stand pivot transfer to recliner with Max A +2. Pt motivated to participate in therapy despite significant pain in bilateral feet. Pt requiring Min A to facilitate lateral movement of LLE and has difficulty putting weight thorugh LLE due to pain.  Toileting- Clothing Manipulation and Hygiene: Total assistance, Bed level Functional mobility during ADLs: Minimal assistance, +2 for safety/equipment, Rolling walker General ADL Comments: Pt highly motivated to participate in therapy and return to PLOF. Pt eating upon arrival and stating "no I can wait. I want to work"    Mobility  Overal bed mobility: Needs Assistance Bed Mobility: Supine to Sit Rolling: Min guard, Min assist Supine to sit: HOB elevated, Min assist Sit to supine: Min assist General bed mobility comments: Assist provided for frequent position changes for vestibular  testing    Transfers  Overall transfer level: Needs assistance Equipment used: Rolling walker (2 wheeled) Transfers: Sit to/from Stand Sit to Stand: Min assist Stand pivot transfers: Min assist  Lateral/Scoot Transfers: Mod assist, +2 physical assistance General transfer comment: Assist for boost to stand, and for balance support when takin gpivotal steps around to the bed.     Ambulation / Gait / Stairs / Wheelchair Mobility  Ambulation/Gait Ambulation/Gait assistance: Min assist, +2 safety/equipment Ambulation Distance (Feet): 60 Feet(30'; seated rest; 30') Assistive device: Rolling walker (2 wheeled) Gait Pattern/deviations: Decreased stride length, Shuffle, Trunk flexed General Gait Details: With shoes donned, pt was able to ambulate fairly well  with RW for support. Assist provided for balance and occasional walker management. Family member provided chair follow, and pt took 1 seated rest break before ambulating back to the room. Pt reports SOB and continued "woozy" feeling while ambulating, and was instructed in pursed-lip breathing. Sats improved from 93-98% on RA during seated rest. Gait velocity: Decreased Gait velocity interpretation: Below normal speed for age/gender    Posture / Balance Dynamic Sitting Balance Sitting balance - Comments: Able to maintain sitting at EOB during self feeding Balance Overall balance assessment: Needs assistance Sitting-balance support: Feet supported, No upper extremity supported Sitting balance-Leahy Scale: Fair Sitting balance - Comments: Able to maintain sitting at EOB during self feeding Postural control: Posterior lean, Right lateral lean Standing balance support: During functional activity, Single extremity supported Standing balance-Leahy Scale: Poor Standing balance comment: Reliant on UE support during functional mobility.     Special needs/care consideration BiPAP/CPAP  N/a CPM  N/a Continuous Drip IV n/a Dialysis  N/a Life Vest   N/a Oxygen n/a Special Bed  N/a Trach Size  N/a Wound Vac/a Skin lip laceration; ecchymosis to face, chest, arm and hand Bowel mgmt: no LBM documented Bladder mgmt: continent Diabetic mgmt yes pta Legally blind left eye   Previous Home Environment Living Arrangements: Alone  Lives With: Alone Available Help at Discharge: Family, Available 24 hours/day(son and daughter to arrange 24/7 supervision at d/c) Type of Home: House Home Layout: Laundry or work area in basement, Multi-level Alternate Level Stairs-Rails: Right Alternate Level Stairs-Number of Steps: [place]: No  Discharge Living Setting Plans for Discharge Living Setting: Patient's home Type of Home at Discharge: House Discharge Home Layout: Multi-level, Laundry or work area in basement Alternate Level Stairs-Rails: Right Alternate Level Stairs-Number of Steps: 14 Does the patient have any problems obtaining your medications?: No  Social/Family/Support Systems Patient Roles: Manufacturing systems engineer Information: [name], daughter Anticipated Caregiver: daughter and son Anticipated Caregiver's Contact Information: see above Ability/Limitations of Caregiver: none Caregiver Availability: 24/7 Discharge Plan Discussed with Primary Caregiver: Yes Is Caregiver In Agreement with Plan?: Yes Does Caregiver/Family have Issues with Lodging/Transportation while Pt is in Rehab?: No  Goals/Additional Needs Patient/Family Goal for Rehab: supervision with PT and OT Expected length of stay: ELOS 12 to 17 days Pt/Family Agrees to Admission and willing to participate: Yes Program Orientation Provided & Reviewed with Pt/Caregiver Including Roles  & Responsibilities: Yes  Decrease burden of Care through IP rehab admission: n/a  Possible need for SNF placement upon discharge:not anticipated  Patient Condition: This patient's medical and functional status has changed since the consult dated: 12/01/2017 in which the Rehabilitation  Physician determined and documented that the patient's condition is appropriate for intensive rehabilitative care in an inpatient rehabilitation facility. See "History of Present Illness" (above) for medical update. Functional changes are: min to mod assist. Patient's medical and functional status update has been discussed with the Rehabilitation physician and patient remains appropriate for inpatient rehabilitation. Will admit to inpatient rehab today.  Preadmission Screen Completed By:  Cleatrice Burke, 12/03/2017 3:51 PM ______________________________________________________________________   Discussed status with Dr. Posey Pronto on 12/03/2017 at  1551 and received telephone approval for admission today.  Admission Coordinator:  Cleatrice Burke, time 3646 Date 12/03/2017

## 2017-12-03 NOTE — Progress Notes (Signed)
CSW received referral to complete SBIRT with patient. Patient reports that she is doing better with walking and is hoping to get into CIR for rehab. SBIRT completed. Patient states she drinks a glass of wine every once in a while but does not feel she has a problem with it and denies needing resources. Patient's son is a great source of support to her.  CSW signing off.  Percell Locus Kashira Behunin LCSW [phone]

## 2017-12-03 NOTE — Progress Notes (Signed)
I have insurance approval to admit pt to inpt rehab today. I contacted Trauma PA, RN CM and SW. I will make the arrangements to admit today. [phone]

## 2017-12-03 NOTE — Progress Notes (Signed)
Physical Therapy Treatment Patient Details Name: Sandy Owens MRN: [id number] DOB: [birth year] Today's Date: 12/03/2017    History of Present Illness 84 y.o. female admitted on 11/26/17 for fall with resultant moderate volume SAH, C4/5 spinous process fractures (soft collar), R carotid stenosis, facial fxs (open anterior maxilla including alveolar ridge with teetch 7,8,9 avulsion fx, R nasal bone fx, nasal septum fx, facial contusions, and right anterior facial hematoma, multiple lip lacs s/p closure) and oral surgery on 11/28/17  extraction of teeth # 8, 10, 11, 13, open reduction alvolar fx, enamloplasty teeth # 20 -28.  Pt with significant PMH of neuromuscular d/o, HTN, DM, CAD, L breast CA s/p L mastectomy, L TKA, CABG, and back surgery.    PT Comments    Pt seen for mobility at the request of RN. Will return later today for vestibular assessment.  Overall pt progressing well with mobility and was able to ambulate 30' x2 with RW and min assist for support. With shoes donned, pt reports continued improvement with B foot pain. Pt required assist for balance as well as cues for pursed-lip breathing, and general safety. Pt complained of DOE however sats remained >93% throughout gait training on RA. Will continue to follow and progress as able per POC. CIR remains appropriate.  Follow Up Recommendations  CIR     Equipment Recommendations  Rolling walker with 5" wheels    Recommendations for Other Services       Precautions / Restrictions Precautions Precautions: Fall Precaution Comments: Low vision at baseline Restrictions Weight Bearing Restrictions: No    Mobility  Bed Mobility Overal bed mobility: Needs Assistance Bed Mobility: Supine to Sit     Supine to sit: HOB elevated;Min assist     General bed mobility comments: Pt moving to the L side this session and reports no dizziness. She was able to transition to EOB with min assist for scooting. Increased time to gain and  maintain sitting balance and pt reports "I feel woozy".  Transfers Overall transfer level: Needs assistance Equipment used: Rolling walker (2 wheeled) Transfers: Sit to/from Stand Sit to Stand: Min assist         General transfer comment: VC's for hand placement on seated surface for safety. Pt was able to power-up to full stand with min assist for balance support and safety. Increased time required for full trunk extension on initial stand.   Ambulation/Gait Ambulation/Gait assistance: Min assist;+2 safety/equipment Ambulation Distance (Feet): 60 Feet(30'; seated rest; 30') Assistive device: Rolling walker (2 wheeled) Gait Pattern/deviations: Decreased stride length;Shuffle;Trunk flexed Gait velocity: Decreased Gait velocity interpretation: Below normal speed for age/gender General Gait Details: With shoes donned, pt was able to ambulate fairly well with RW for support. Assist provided for balance and occasional walker management. Family member provided chair follow, and pt took 1 seated rest break before ambulating back to the room. Pt reports SOB and continued "woozy" feeling while ambulating, and was instructed in pursed-lip breathing. Sats improved from 93-98% on RA during seated rest.   Stairs            Wheelchair Mobility    Modified Rankin (Stroke Patients Only)       Balance Overall balance assessment: Needs assistance Sitting-balance support: Feet supported;No upper extremity supported Sitting balance-Leahy Scale: Fair     Standing balance support: Bilateral upper extremity supported;During functional activity Standing balance-Leahy Scale: Poor Standing balance comment: Reliant on UE support during functional mobility.  Cognition Arousal/Alertness: Awake/alert Behavior During Therapy: Flat affect Overall Cognitive Status: Impaired/Different from baseline Area of Impairment: Following  commands;Memory;Safety/judgement;Awareness                   Current Attention Level: Selective Memory: Decreased short-term memory Following Commands: Follows one step commands consistently;Follows one step commands with increased time;Follows multi-step commands inconsistently;Follows multi-step commands with increased time Safety/Judgement: Decreased awareness of safety Awareness: Emergent Problem Solving: Slow processing;Requires verbal cues        Exercises General Exercises - Lower Extremity Heel Slides: 15 reps;Both    General Comments        Pertinent Vitals/Pain Pain Assessment: Faces Faces Pain Scale: Hurts little more Pain Location: B feet (L>R) - improved with shoes donned Pain Descriptors / Indicators: Grimacing;Guarding;Sore Pain Intervention(s): Monitored during session    Home Living                      Prior Function            PT Goals (current goals can now be found in the care plan section) Acute Rehab PT Goals Patient Stated Goal: to return to independence  PT Goal Formulation: With patient Time For Goal Achievement: 12/12/17 Potential to Achieve Goals: Good Progress towards PT goals: Progressing toward goals    Frequency    Min 3X/week      PT Plan Current plan remains appropriate    Co-evaluation              AM-PAC PT "6 Clicks" Daily Activity  Outcome Measure  Difficulty turning over in bed (including adjusting bedclothes, sheets and blankets)?: Unable Difficulty moving from lying on back to sitting on the side of the bed? : Unable Difficulty sitting down on and standing up from a chair with arms (e.g., wheelchair, bedside commode, etc,.)?: Unable Help needed moving to and from a bed to chair (including a wheelchair)?: A Little Help needed walking in hospital room?: A Little Help needed climbing 3-5 steps with a railing? : A Lot 6 Click Score: 11    End of Session Equipment Utilized During Treatment:  Cervical collar;Gait belt Activity Tolerance: Patient limited by pain;Patient limited by fatigue Patient left: in chair;with call bell/phone within reach;with family/visitor present Nurse Communication: Mobility status PT Visit Diagnosis: Unsteadiness on feet (R26.81);Pain;History of falling (Z91.81) Pain - part of body: Ankle and joints of foot(neck)     Time: 5883-2549 PT Time Calculation (min) (ACUTE ONLY): 25 min  Charges:  $Gait Training: 23-37 mins                    G Codes:       Rolinda Roan, PT, DPT Acute Rehabilitation Services Pager: [phone]    Thelma Comp 12/03/2017, 12:15 PM

## 2017-12-04 ENCOUNTER — Inpatient Hospital Stay (HOSPITAL_COMMUNITY): Payer: Medicare HMO | Admitting: Speech Pathology

## 2017-12-04 ENCOUNTER — Inpatient Hospital Stay (HOSPITAL_COMMUNITY): Payer: Medicare HMO | Admitting: Occupational Therapy

## 2017-12-04 ENCOUNTER — Inpatient Hospital Stay (HOSPITAL_COMMUNITY): Payer: Medicare HMO

## 2017-12-04 ENCOUNTER — Inpatient Hospital Stay (HOSPITAL_COMMUNITY): Payer: Medicare HMO | Admitting: Physical Therapy

## 2017-12-04 DIAGNOSIS — M7989 Other specified soft tissue disorders: Secondary | ICD-10-CM

## 2017-12-04 DIAGNOSIS — R1312 Dysphagia, oropharyngeal phase: Secondary | ICD-10-CM

## 2017-12-04 DIAGNOSIS — R339 Retention of urine, unspecified: Secondary | ICD-10-CM

## 2017-12-04 DIAGNOSIS — S06301D Unspecified focal traumatic brain injury with loss of consciousness of 30 minutes or less, subsequent encounter: Secondary | ICD-10-CM

## 2017-12-04 DIAGNOSIS — N183 Chronic kidney disease, stage 3 (moderate): Secondary | ICD-10-CM

## 2017-12-04 LAB — COMPREHENSIVE METABOLIC PANEL
ALT: 30 U/L (ref 14–54)
AST: 42 U/L — ABNORMAL HIGH (ref 15–41)
Albumin: 2.3 g/dL — ABNORMAL LOW (ref 3.5–5.0)
Alkaline Phosphatase: 114 U/L (ref 38–126)
Anion gap: 14 (ref 5–15)
BUN: 17 mg/dL (ref 6–20)
CO2: 21 mmol/L — ABNORMAL LOW (ref 22–32)
Calcium: 8.5 mg/dL — ABNORMAL LOW (ref 8.9–10.3)
Chloride: 100 mmol/L — ABNORMAL LOW (ref 101–111)
Creatinine, Ser: 1 mg/dL (ref 0.44–1.00)
GFR calc Af Amer: 55 mL/min — ABNORMAL LOW (ref >60)
GFR calc non Af Amer: 47 mL/min — ABNORMAL LOW (ref >60)
Glucose, Bld: 126 mg/dL — ABNORMAL HIGH (ref 65–99)
Potassium: 4 mmol/L (ref 3.5–5.1)
Sodium: 135 mmol/L (ref 135–145)
Total Bilirubin: 0.6 mg/dL (ref 0.3–1.2)
Total Protein: 6.1 g/dL — ABNORMAL LOW (ref 6.5–8.1)

## 2017-12-04 LAB — CBC WITH DIFFERENTIAL/PLATELET
Basophils Absolute: 0 10*3/uL (ref 0.0–0.1)
Basophils Relative: 0 %
Eosinophils Absolute: 0.2 10*3/uL (ref 0.0–0.7)
Eosinophils Relative: 2 %
HCT: 32.2 % — ABNORMAL LOW (ref 36.0–46.0)
Hemoglobin: 10.9 g/dL — ABNORMAL LOW (ref 12.0–15.0)
Lymphocytes Relative: 19 %
Lymphs Abs: 2.1 10*3/uL (ref 0.7–4.0)
MCH: 31.6 pg (ref 26.0–34.0)
MCHC: 33.9 g/dL (ref 30.0–36.0)
MCV: 93.3 fL (ref 78.0–100.0)
Monocytes Absolute: 0.8 10*3/uL (ref 0.1–1.0)
Monocytes Relative: 7 %
Neutro Abs: 7.7 10*3/uL (ref 1.7–7.7)
Neutrophils Relative %: 72 %
Platelets: 310 10*3/uL (ref 150–400)
RBC: 3.45 MIL/uL — ABNORMAL LOW (ref 3.87–5.11)
RDW: 13.7 % (ref 11.5–15.5)
WBC: 10.8 10*3/uL — ABNORMAL HIGH (ref 4.0–10.5)
nRBC: 1 /100 WBC — ABNORMAL HIGH

## 2017-12-04 LAB — GLUCOSE, CAPILLARY
Glucose-Capillary: 100 mg/dL — ABNORMAL HIGH (ref 65–99)
Glucose-Capillary: 121 mg/dL — ABNORMAL HIGH (ref 65–99)
Glucose-Capillary: 120 mg/dL — ABNORMAL HIGH (ref 65–99)
Glucose-Capillary: 171 mg/dL — ABNORMAL HIGH (ref 65–99)

## 2017-12-04 MED ORDER — GUAIFENESIN-DM 100-10 MG/5ML PO SYRP
10.0000 mL | ORAL_SOLUTION | Freq: Four times a day (QID) | ORAL | Status: DC
  Administered 2017-12-04 – 2017-12-13 (×34): 10 mL via ORAL
  Filled 2017-12-04 (×35): qty 10

## 2017-12-04 NOTE — Progress Notes (Signed)
Physical Therapy Assessment and Plan  Patient Details  Name: Sandy Owens MRN: [id number] Date of Birth: 03-Mar-1924  PT Diagnosis: Abnormality of gait, BPPV, Cognitive deficits, Difficulty walking, Impaired sensation and Muscle weakness Rehab Potential: Good ELOS: 7-10 days   Today's Date: 12/04/2017 PT Individual Time: 0930-1030 PT Individual Time Calculation (min): 60 min    Problem List:  Patient Active Problem List   Diagnosis Date Noted  . Focal traumatic brain injury with LOC of 30 minutes or less, sequela ([place]) 12/03/2017  . Hypothyroidism   . History of gout   . Urinary retention   . Fall   . Hypoxia   . Lip laceration   . Pain   . Subarachnoid hemorrhage ([place])   . History of breast cancer   . Coronary artery disease involving coronary bypass graft of native heart without angina pectoris   . Benign essential HTN   . Type 2 diabetes mellitus with peripheral neuropathy (HCC)   . Acute idiopathic gout of right foot   . Oropharyngeal dysphagia   . Leukocytosis   . Acute blood loss anemia   . Stage 3 chronic kidney disease ([place])   . Multiple trauma 11/26/2017  . Breast cancer ([place]) 06/06/2014  . Cancer of breast, female ([place]) 04/13/2014    Past Medical History:  Past Medical History:  Diagnosis Date  . Cancer [facility])    left breast  . Coronary artery disease    CABG - 3- 2001  . Diabetes mellitus without complication ([place])   . Hypertension   . Hypothyroidism   . Neuromuscular disorder ([place])    neuropathy in feet  and legs   Past Surgical History:  Past Surgical History:  Procedure Laterality Date  . ABDOMINAL HYSTERECTOMY    . APPENDECTOMY    . BACK SURGERY    . cabg    . HIP SURGERY      right hip replacement  . JOINT REPLACEMENT     left knee replacement  . MASTECTOMY Left   . MULTIPLE EXTRACTIONS WITH ALVEOLOPLASTY N/A 11/28/2017   Procedure: MULTIPLE EXTRACTION WITH ALVEOLOPLASTY;  Surgeon: Diona Browner, DDS;  Location: [place];  Service: Oral  Surgery;  Laterality: N/A;  . SIMPLE MASTECTOMY WITH AXILLARY SENTINEL NODE BIOPSY Left 06/06/2014   Procedure: LEFT SIMPLE MASTECTOMY;  Surgeon: Excell Seltzer, MD;  Location: [place];  Service: General;  Laterality: Left;    Assessment & Plan Clinical Impression: Sandy Kreuser Holenbeckis a 84 y.o.right-handed femalewith history of left breast cancer with mastectomy, CAD with CABG 2001, hypertension, diabetes mellitus. Presented 11/26/2017 after ground-level fall. Per chart review and daughter,patient lives alone. Multilevel home. Reports she does not drive due to poor vision. She manages her own bills and finances.She has a daughter in the area that checks on her regularly. There is a son from out of town who plans to provide assistance as needed.Cranial CT reviewed, showing SAH. Per report, Cervical spine and maxillofacial films revealed acute moderate volume subarachnoid hemorrhage. Acute open fracture through anterior maxilla including alveolar ridge with teeth 7, 8 and 9 avulsion fractures. Acute right nasal bone, osseous nasal septum fracture. Facial contusions and a 4 x 4 x 2.5 cm right anterior facial hematoma. Acute nondisplaced C4 and C5 spinous process fractures. Conservative care of traumatic subarachnoid hemorrhage. Underwent extraction of multiple teeth 11/28/2017. Soft collar for C4 and C5 spinous process fractures for comfort. Incidental findings of right carotid stenosis await plan for CTA of neck versus follow-up as outpatient.  Hospital course pain management.  Acute blood loss anemia 8.9 and monitored. Follow cranial CT scan of the head 11/27/2017 stable.  I will follow-up x-rays of right wrist hand as well as left wrist and hand unremarkable.  There was a lucency of right index finger likely felt to be gout no plan for MRI at this time.  Subcutaneous Lovenox added for DVT prophylaxis 12/01/2017.  Patient with bouts of urinary retention placed on low-dose  Urecholine.  Dysphasia #1 thin liquid diet.Therapy evaluations completed with recommendations of physical medicine rehab consult.  Patient was admitted for a comprehensive rehab program   Patient transferred to CIR on 12/03/2017 .   Patient currently requires min with mobility secondary to muscle weakness, decreased cardiorespiratoy endurance, central origin and decreased standing balance.  Prior to hospitalization, patient was independent  with mobility and lived with Alone in a House home.  Home access is 3Stairs to enter.  Patient will benefit from skilled PT intervention to maximize safe functional mobility, minimize fall risk and decrease caregiver burden for planned discharge home with intermittent assist.  Anticipate patient will benefit from follow up [facility] at discharge.  Skilled Therapeutic Intervention Evaluation completed (see details above and below) with education on PT POC and goals and individual treatment initiated with focus on functional transfers, gait training with RW, and stair assessment. Pt reports no pain at rest or with activity. Pt is able to perform bed mobility with SBA with HOB elevated and use of bedrails. Stand pivot transfer bed to w/c with min assist without AD. Ambulation x 50 ft with RW and min assist for balance. Pt exhibits decreased B step length with gait. Ascend/descend 8 4" stairs with 2 handrails and min assist, step-to and step-through gait pattern. Car transfer with min assist for balance. Pt fatigues quickly and requires frequent rest breaks. Pt has possible BPPV which possibly lead to the fall causing this injury, will assess further. Pt left semi-reclined in bed at end of session, needs in reach, bed alarm in place, son present at bedside.  PT Evaluation Precautions/Restrictions Precautions Precautions: Fall Precaution Comments: Low vision at baseline; BPPV Required Braces or Orthoses: Other Brace/Splint(soft collar for comfort) Other Brace/Splint: soft  collar for comfort Restrictions Weight Bearing Restrictions: No Home Living/Prior Functioning Home Living Available Help at Discharge: Family;Available 24 hours/day Type of Home: House Home Access: Stairs to enter [employer] of Steps: 3 Entrance Stairs-Rails: Right Home Layout: Multi-level;Able to live on main level with bedroom/bathroom  Lives With: Alone Prior Function Level of Independence: Independent with gait;Independent with transfers Driving: No Vocation: Retired Radiographer, 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 History: (low vision at baseline) Perception Perception: Within Functional Limits Praxis Praxis: Intact  Cognition Arousal/Alertness: Awake/alert Orientation Level: Oriented X4 Safety/Judgment: Appears intact [employer] Scales of Cognitive Functioning: Purposeful/appropriate Sensation Sensation Light Touch: Impaired Detail Light Touch Impaired Details: Impaired RLE;Impaired LLE(neuropathy) Proprioception: Appears Intact Coordination Gross Motor Movements are Fluid and Coordinated: No Heel Shin Test: normal R/L Motor  Motor Motor: Within Functional Limits  Trunk/Postural Assessment  Cervical Assessment Cervical Assessment: Exceptions to [facility] Cervical AROM Overall Cervical AROM Comments: forward head Thoracic Assessment Thoracic Assessment: Exceptions to [facility] Thoracic AROM Overall Thoracic AROM Comments: kyphotic Lumbar Assessment Lumbar Assessment: Within Functional Limits Postural Control Postural Control: Within Functional Limits  Balance Balance Balance Assessed: Yes Static Sitting Balance Static Sitting - Balance Support: Bilateral upper extremity supported;Feet supported Static Sitting - Level of Assistance: 5: Stand by assistance Dynamic Sitting Balance  Dynamic Sitting - Balance Support: Feet supported;No upper extremity supported Dynamic Sitting - Level of Assistance: 5: Stand by assistance Static  Standing Balance Static Standing - Balance Support: Bilateral upper extremity supported;During functional activity Static Standing - Level of Assistance: 4: Min assist Dynamic Standing Balance Dynamic Standing - Balance Support: Bilateral upper extremity supported;During functional activity Dynamic Standing - Level of Assistance: 4: Min assist Extremity Assessment   RLE Assessment RLE Assessment: Within Functional Limits(4/5 grossly) LLE Assessment LLE Assessment: Within Functional Limits(4/5 grossly)   See Function Navigator for Current Functional Status.   Refer to Care Plan for Long Term Goals  Recommendations for other services: Therapeutic Recreation  Pet therapy and Other: Vestibular Evaluation  Discharge Criteria: Patient will be discharged from PT if patient refuses treatment 3 consecutive times without medical reason, if treatment goals not met, if there is a change in medical status, if patient makes no progress towards goals or if patient is discharged from hospital.  The above assessment, treatment plan, treatment alternatives and goals were discussed and mutually agreed upon: by patient and by family  Excell Seltzer, PT, DPT  12/04/2017, 4:18 PM

## 2017-12-04 NOTE — Evaluation (Signed)
Occupational Therapy Assessment and Plan  Patient Details  Name: Sandy Owens MRN: [id number] Date of Birth: 09-10-1923  OT Diagnosis: abnormal posture, acute pain, cognitive deficits, muscle weakness (generalized) and impaired sensation Rehab Potential: Rehab Potential (ACUTE ONLY): Good ELOS: 7-10 days   Today's Date: 12/04/2017 OT Individual Time: 1300-1415 OT Individual Time Calculation (min): 75 min     Problem List:  Patient Active Problem List   Diagnosis Date Noted  . Focal traumatic brain injury with LOC of 30 minutes or less, sequela ([place]) 12/03/2017  . Hypothyroidism   . History of gout   . Urinary retention   . Fall   . Hypoxia   . Lip laceration   . Pain   . Subarachnoid hemorrhage ([place])   . History of breast cancer   . Coronary artery disease involving coronary bypass graft of native heart without angina pectoris   . Benign essential HTN   . Type 2 diabetes mellitus with peripheral neuropathy (HCC)   . Acute idiopathic gout of right foot   . Oropharyngeal dysphagia   . Leukocytosis   . Acute blood loss anemia   . Stage 3 chronic kidney disease ([place])   . Multiple trauma 11/26/2017  . Breast cancer ([place]) 06/06/2014  . Cancer of breast, female ([place]) 04/13/2014    Past Medical History:  Past Medical History:  Diagnosis Date  . Cancer [facility])    left breast  . Coronary artery disease    CABG - 3- 2001  . Diabetes mellitus without complication ([place])   . Hypertension   . Hypothyroidism   . Neuromuscular disorder ([place])    neuropathy in feet  and legs   Past Surgical History:  Past Surgical History:  Procedure Laterality Date  . ABDOMINAL HYSTERECTOMY    . APPENDECTOMY    . BACK SURGERY    . cabg    . HIP SURGERY      right hip replacement  . JOINT REPLACEMENT     left knee replacement  . MASTECTOMY Left   . MULTIPLE EXTRACTIONS WITH ALVEOLOPLASTY N/A 11/28/2017   Procedure: MULTIPLE EXTRACTION WITH ALVEOLOPLASTY;  Surgeon: Diona Browner, DDS;   Location: [place];  Service: Oral Surgery;  Laterality: N/A;  . SIMPLE MASTECTOMY WITH AXILLARY SENTINEL NODE BIOPSY Left 06/06/2014   Procedure: LEFT SIMPLE MASTECTOMY;  Surgeon: Excell Seltzer, MD;  Location: [place];  Service: General;  Laterality: Left;    Assessment & Plan Clinical Impression: Patient is a 84 y.o. year old female with 84 y.o.right-handed femalewith history of left breast cancer with mastectomy, CAD with CABG 2001, hypertension, diabetes mellitus. Presented 11/26/2017 after ground-level fall. Per chart review and daughter,patient lives alone. Multilevel home. Reports she does not drive due to poor vision. She manages her own bills and finances.She has a daughter in the area that checks on her regularly. There is a son from out of town who plans to provide assistance as needed.Cranial CT reviewed, showing SAH. Per report, Cervical spine and maxillofacial films revealed acute moderate volume subarachnoid hemorrhage. Acute open fracture through anterior maxilla including alveolar ridge with teeth 7, 8 and 9 avulsion fractures. Acute right nasal bone, osseous nasal septum fracture. Facial contusions and a 4 x 4 x 2.5 cm right anterior facial hematoma. Acute nondisplaced C4 and C5 spinous process fractures. Conservative care of traumatic subarachnoid hemorrhage. Underwent extraction of multiple teeth 11/28/2017. Soft collar for C4 and C5 spinous process fractures for comfort. Incidental findings of right carotid stenosis await  plan for CTA of neck versus follow-up as outpatient. Hospital course pain management.  Acute blood loss anemia 8.9 and monitored. Follow cranial CT scan of the head 11/27/2017 stable.  I will follow-up x-rays of right wrist hand as well as left wrist and hand unremarkable.  There was a lucency of right index finger likely felt to be gout no plan for MRI at this time.  Subcutaneous Lovenox added for DVT prophylaxis 12/01/2017.  Patient  with bouts of urinary retention placed on low-dose Urecholine.  Dysphasia #1 thin liquid diet.Therapy evaluations completed with recommendations of physical medicine rehab consult. Patient transferred to CIR on 12/03/2017 .    Patient currently requires min with basic self-care skills secondary to muscle weakness, decreased cardiorespiratoy endurance, decreased attention, decreased awareness, decreased problem solving, decreased safety awareness and decreased memory and decreased sitting balance, decreased standing balance and decreased balance strategies.  Prior to hospitalization, patient could complete ADLs with modified independent .  Patient will benefit from skilled intervention to increase independence with basic self-care skills prior to discharge home with care partner.  Anticipate patient will require intermittent supervision and follow up home health.  OT - End of Session Activity Tolerance: Decreased this session Endurance Deficit: Yes Endurance Deficit Description: needs frequent rest breaks during activity OT Assessment Rehab Potential (ACUTE ONLY): Good OT Barriers to Discharge: (none known at this time) OT Patient demonstrates impairments in the following area(s): Balance;Safety;Cognition;Endurance;Motor;Pain OT Basic ADL's Functional Problem(s): Grooming;Bathing;Dressing;Toileting OT Transfers Functional Problem(s): Toilet OT Plan OT Intensity: Minimum of 1-2 x/day, 45 to 90 minutes OT Frequency: 5 out of 7 days OT Duration/Estimated Length of Stay: 7-10 days OT Treatment/Interventions: Balance/vestibular training;Discharge planning;Pain management;Self Care/advanced ADL retraining;Therapeutic Activities;UE/LE Coordination activities;Cognitive remediation/compensation;Functional mobility training;Patient/family education;Therapeutic Exercise;Community reintegration;DME/adaptive equipment instruction;Neuromuscular re-education;Psychosocial support;UE/LE Strength taining/ROM OT  Self Feeding Anticipated Outcome(s): n/a OT Basic Self-Care Anticipated Outcome(s): mod I OT Toileting Anticipated Outcome(s): mod I  OT Bathroom Transfers Anticipated Outcome(s): mod I  OT Recommendation Recommendations for Other Services: Neuropsych consult;Therapeutic Recreation consult Therapeutic Recreation Interventions: Pet therapy Patient destination: Home Follow Up Recommendations: Home health OT;24 hour supervision/assistance Equipment Recommended: To be determined   Skilled Therapeutic Intervention Ot educated pt and son on OT purpose, POC, and goals with them verbalizing understanding and agreement. Skilled OT intervention with focus on functional transfers, balance, self care, and safety awareness. Pt performed functional transfers and self care at overall min A level with use of RW. Pt needing multiple rest breaks secondary to fatigue. Pt returning to bed level at end of session for comfort. Call bell and all needed items within reach. Bed alarm activated.   OT Evaluation Precautions/Restrictions  Precautions Precautions: Fall Precaution Comments: Low vision at baseline; BPPV Required Braces or Orthoses: Other Brace/Splint Other Brace/Splint: soft collar for comfort Restrictions Weight Bearing Restrictions: No General   Vital Signs Therapy Vitals Temp: (!) 97.4 F (36.3 C) Temp Source: Oral Pulse Rate: (!) 58 Resp: 16 BP: (!) 168/58 Patient Position (if appropriate): Lying Oxygen Therapy SpO2: 98 % O2 Device: Room Air Pain Pain Assessment Pain Score: 0-No pain Home Living/Prior Functioning Home Living Available Help at Discharge: Family, Available 24 hours/day Type of Home: House Home Access: Stairs to enter [employer] of Steps: 3 Entrance Stairs-Rails: Right Home Layout: Multi-level, Able to live on main level with bedroom/bathroom Alternate Level Stairs-Number of Steps: 14 Alternate Level Stairs-Rails: Right  Lives With: Alone Prior  Function Level of Independence: Independent with gait, Independent with transfers Driving: No Vocation: Retired Comments: reports  she does not drive due to poor vision at baseline, she does not normally walk with an AD, she manages her bills with help from her daughter and she uses a pill box for her meds.  Vision Baseline Vision/History: Wears glasses Wears Glasses: At all times Patient Visual Report: No change from baseline Praxis Praxis: Intact Cognition Overall Cognitive Status: Impaired/Different from baseline Arousal/Alertness: Awake/alert Orientation Level: Person;Place;Situation Person: Oriented Place: Oriented Situation: Oriented Year: 2019 Month: April Day of Week: Correct Memory: Impaired Memory Impairment: Retrieval deficit Immediate Memory Recall: Sock;Blue;Bed Memory Recall: Sock(1/3) Memory Recall Sock: Without Cue Attention: Selective Selective Attention: Impaired Selective Attention Impairment: Functional complex Awareness: Impaired Awareness Impairment: Emergent impairment Problem Solving: Impaired Problem Solving Impairment: Functional complex Safety/Judgment: Appears intact [employer] Scales of Cognitive Functioning: Purposeful/appropriate Sensation Sensation Light Touch: Impaired Detail Light Touch Impaired Details: Impaired RLE;Impaired LLE Proprioception: Appears Intact Coordination Gross Motor Movements are Fluid and Coordinated: No Fine Motor Movements are Fluid and Coordinated: No Motor  Motor Motor: Within Functional Limits   Trunk/Postural Assessment  Cervical Assessment Cervical Assessment: Exceptions to [facility] Cervical AROM Overall Cervical AROM Comments: soft collar donned Thoracic Assessment Thoracic Assessment: Exceptions to [facility] Thoracic AROM Overall Thoracic AROM Comments: kyphotic Lumbar Assessment Lumbar Assessment: Within Functional Limits Postural Control Postural Control: Deficits on evaluation   Balance Balance Balance Assessed: Yes Dynamic Sitting Balance Dynamic Sitting - Balance Support: Feet supported;No upper extremity supported Dynamic Sitting - Level of Assistance: 5: Stand by assistance Static Standing Balance Static Standing - Balance Support: Bilateral upper extremity supported;During functional activity Static Standing - Level of Assistance: 4: Min assist Extremity/Trunk Assessment RUE Assessment RUE Assessment: Exceptions to WFL(3+/5 throughout) LUE Assessment LUE Assessment: Exceptions to WFL(3+/5 throughout)   See Function Navigator for Current Functional Status.   Refer to Care Plan for Long Term Goals  Recommendations for other services: Therapeutic Recreation  Pet therapy   Discharge Criteria: Patient will be discharged from OT if patient refuses treatment 3 consecutive times without medical reason, if treatment goals not met, if there is a change in medical status, if patient makes no progress towards goals or if patient is discharged from hospital.  The above assessment, treatment plan, treatment alternatives and goals were discussed and mutually agreed upon: by patient and by family  Gypsy Decant 12/04/2017, 5:25 PM

## 2017-12-04 NOTE — Progress Notes (Signed)
[facility]     PROGRESS NOTE    Subjective/Complaints: Felt that she was having more difficulties breathing over night. Intermittent dry cough. ?wheezing  ROS: Patient denies fever, rash, sore throat, blurred vision, nausea, vomiting, diarrhea,   shortness of breath or chest pain, joint or back pain, headache, or mood change.   Objective: Vital Signs: Blood pressure (!) 175/67, pulse 63, temperature 99.2 F (37.3 C), temperature source Oral, resp. rate 18, height 5' (1.524 m), weight 62.3 kg (137 lb 5.6 oz), SpO2 94 %. Dg Chest Port 1 View  Result Date: 12/02/2017 CLINICAL DATA:  Fever EXAM: PORTABLE CHEST 1 VIEW COMPARISON:  Chest x-ray of 11/30/2017 FINDINGS: There is persistent opacity at the medial left lung base suspicious for atelectasis and/or pneumonia. Two-view chest x-ray may be helpful. The right lung is clear. Mediastinal and hilar contours are unremarkable and median sternotomy sutures are noted from prior CABG. Moderate cardiomegaly is unchanged. No bony abnormality seen other than degenerative change in both shoulders. IMPRESSION: 1. Persistent opacity at the medial left lung base. Consider atelectasis or possibly left lower lobe pneumonia. 2. Stable cardiomegaly. Electronically Signed   By: Ivar Drape M.D.   On: 12/02/2017 09:36   Recent Labs    12/02/17 0411 12/03/17 1133  WBC 12.4* 9.9  HGB 8.9* 10.3*  HCT 27.9* 30.8*  PLT 227 291   Recent Labs    12/02/17 0411 12/03/17 1133  NA 134* 132*  K 3.8 4.1  CL 100* 98*  GLUCOSE 96 144*  BUN 18 18  CREATININE 1.02* 1.07*  CALCIUM 8.1* 8.4*   CBG (last 3)  Recent Labs    12/03/17 1159 12/03/17 1659 12/04/17 0641  GLUCAP 155* 104* 100*    Wt Readings from Last 3 Encounters:  12/03/17 62.3 kg (137 lb 5.6 oz)  11/27/17 59.8 kg (131 lb 13.4 oz)  12/31/15 62.1 kg (137 lb)    Physical Exam:  Constitutional: She appears well-developed and well-nourished.  HENT:  Bruises to  face Eyes: PERRL Neck:  A soft cervical collar was in place  Cardiovascular: RRR without murmur. No JVD .  Respiratory: ? A few upper airway sounds. Lungs sound clear otherwise, decreased air movement overall.  GI: Soft. Bowel sounds are normal. She exhibits no distension.  Musculoskeletal:  Facial edema and tenderness, right maxilla Tenderness bilaterally feet  Neurological:  Alert, oriented x 3.  Motor: RUE: 4+/5 proximal to distal LUE: 4/5 proximal to distal LLE: 4--4/5 proximal to distal RLE: 4--4/5 proximal to distal--motor exam stable Skin:  Skin.  Healing abrasions on legs/arms  Psychiatric: She has a normal mood and affect. Her behavior is normal.     Assessment/Plan: 1. Functional deficits secondary to traumatic SAH which require 3+ hours per day of interdisciplinary therapy in a comprehensive inpatient rehab setting. Physiatrist is providing close team supervision and 24 hour management of active medical problems listed below. Physiatrist and rehab team continue to assess barriers to discharge/monitor patient progress toward functional and medical goals.  Function:  Bathing Bathing position      Bathing parts      Bathing assist        Upper Body Dressing/Undressing Upper body dressing                    Upper body assist        Lower Body Dressing/Undressing Lower body dressing  Lower body assist        Toileting Toileting          Toileting assist     Transfers Chair/bed transfer             Locomotion Ambulation           Wheelchair          Cognition Comprehension Comprehension assist level: Follows complex conversation/direction with no assist  Expression Expression assist level: Expresses basic 90% of the time/requires cueing < 10% of the time.  Social Interaction Social Interaction assist level: Interacts appropriately 90% of the time - Needs monitoring or encouragement  for participation or interaction.  Problem Solving Problem solving assist level: Solves basic 50 - 74% of the time/requires cueing 25 - 49% of the time  Memory Memory assist level: Recognizes or recalls 90% of the time/requires cueing < 10% of the time   Medical Problem List and Plan: 1.  Decreased functional mobility secondary to [facility] as well as multiple facial fractures, nasal septum fracture with facial contusions.  Acute nondisplaced C4 and C5 spinous process fractures-soft cervical collar for comfort.  Multiple tooth extractions after fall 11/26/2017   -beginning therapies today 2.  DVT Prophylaxis/Anticoagulation: Subcutaneous Lovenox initiated 12/01/2017.    -vascular study pending 3. Pain Management: Neurontin 100 mg twice daily, Anaprox 275 mg twice daily, hydrocodone/Ultram as needed 4. Mood: Provide emotional support 5. Neuropsych: This patient is capable of making decisions on her own behalf. 6. Skin/Wound Care: Routine skin checks 7. Fluids/Electrolytes/Nutrition: encourage PO.   -I personally reviewed the patient's labs today.     -sodium 132 8.  Acute blood loss anemia.  Latest hemoglobin 8.9.  Follow-up CBC 9.  Urinary retention.  Urecholine 5 mg 3 times daily.  Check PVR's 10.  Hypertension.  Norvasc 5 mg daily, Tenormin 50 mg twice daily, lisinopril 5 mg daily.  Monitor with increased mobility 11.  History of gout.  Colchicine 0.6 mg daily 12.  History of CAD with CABG 2001. Continue to monitor 13.  History of left breast cancer with mastectomy 14.  Hypothyroidism.  Synthroid 15.  Diabetes mellitus.  follow CBG's. Continue SSI  - Patient on Amaryl 1 mg daily prior to admission  -reasonable control at present 16. Shortness of breath:   -may have mild URI (Tm 99.2) and there is a small opacity in the LLL on cxr 4/2,  but I suspect a lot of systems are related to her maxillo-facial trauma/dysphagia.    -wbc's down to 9.9 today   -IS, OOB   -robitussin dm scheduled   -observe  clinically, low threshold to re-check CXR      LOS (Days) [place]  Meredith Staggers, MD 12/04/2017 9:03 AM

## 2017-12-04 NOTE — Progress Notes (Signed)
Patient information reviewed and entered into eRehab system by Wandalee Klang, RN, CRRN, PPS Coordinator.  Information including medical coding and functional independence measure will be reviewed and updated through discharge.     Per nursing patient was given "Data Collection Information Summary for Patients in Inpatient Rehabilitation Facilities with attached "Privacy Act Statement-Health Care Records" upon admission.  

## 2017-12-04 NOTE — Progress Notes (Signed)
Bilateral lower extremity venous duplex completed. There is no obvious evidence of  DVT, suoerficial throbosis, or Baker's cyst. Toma Copier, RVS 12/04/2017 5:46 PM

## 2017-12-04 NOTE — Anesthesia Postprocedure Evaluation (Signed)
Anesthesia Post Note  Patient: Sandy Owens  Procedure(s) Performed: MULTIPLE EXTRACTION WITH ALVEOLOPLASTY (N/A Mouth)     Patient location during evaluation: PACU Anesthesia Type: General Level of consciousness: awake and alert Pain management: pain level controlled Vital Signs Assessment: post-procedure vital signs reviewed and stable Respiratory status: spontaneous breathing, nonlabored ventilation, respiratory function stable and patient connected to nasal cannula oxygen Cardiovascular status: blood pressure returned to baseline and stable Postop Assessment: no apparent nausea or vomiting Anesthetic complications: no    Last Vitals:  Vitals:   12/03/17 1200 12/03/17 1548  BP: (!) 147/53 (!) 169/57  Pulse: 61 61  Resp: (!) 21 (!) 23  Temp: 36.8 C (!) 36.3 C  SpO2: 96% 98%    Last Pain:  Vitals:   12/03/17 1548  TempSrc: Oral  PainSc:                  [name]

## 2017-12-04 NOTE — Evaluation (Addendum)
Speech Language Pathology Assessment and Plan  Patient Details  Name: Sandy Owens MRN: [id number] Date of Birth: 03-Apr-1924  SLP Diagnosis: Cognitive Impairments  Rehab Potential: Good ELOS: 7-10 days     Today's Date: 12/04/2017 SLP Individual Time: 1107-1203 SLP Individual Time Calculation (min): 56 min   Problem List:  Patient Active Problem List   Diagnosis Date Noted  . Focal traumatic brain injury with LOC of 30 minutes or less, sequela ([place]) 12/03/2017  . Hypothyroidism   . History of gout   . Urinary retention   . Fall   . Hypoxia   . Lip laceration   . Pain   . Subarachnoid hemorrhage ([place])   . History of breast cancer   . Coronary artery disease involving coronary bypass graft of native heart without angina pectoris   . Benign essential HTN   . Type 2 diabetes mellitus with peripheral neuropathy (HCC)   . Acute idiopathic gout of right foot   . Oropharyngeal dysphagia   . Leukocytosis   . Acute blood loss anemia   . Stage 3 chronic kidney disease ([place])   . Multiple trauma 11/26/2017  . Breast cancer ([place]) 06/06/2014  . Cancer of breast, female ([place]) 04/13/2014   Past Medical History:  Past Medical History:  Diagnosis Date  . Cancer [facility])    left breast  . Coronary artery disease    CABG - 3- 2001  . Diabetes mellitus without complication ([place])   . Hypertension   . Hypothyroidism   . Neuromuscular disorder ([place])    neuropathy in feet  and legs   Past Surgical History:  Past Surgical History:  Procedure Laterality Date  . ABDOMINAL HYSTERECTOMY    . APPENDECTOMY    . BACK SURGERY    . cabg    . HIP SURGERY      right hip replacement  . JOINT REPLACEMENT     left knee replacement  . MASTECTOMY Left   . MULTIPLE EXTRACTIONS WITH ALVEOLOPLASTY N/A 11/28/2017   Procedure: MULTIPLE EXTRACTION WITH ALVEOLOPLASTY;  Surgeon: Diona Browner, DDS;  Location: [place];  Service: Oral Surgery;  Laterality: N/A;  . SIMPLE MASTECTOMY WITH AXILLARY SENTINEL  NODE BIOPSY Left 06/06/2014   Procedure: LEFT SIMPLE MASTECTOMY;  Surgeon: Excell Seltzer, MD;  Location: [place];  Service: General;  Laterality: Left;    Assessment / Plan / Recommendation Clinical Impression   Sandy Bartko Holenbeckis a 84 y.o.right-handed femalewith history of left breast cancer with mastectomy, CAD with CABG 2001, hypertension, diabetes mellitus. Presented 11/26/2017 after ground-level fall. Per chart review and daughter,patient lives alone. Multilevel home. Reports she does not drive due to poor vision. She manages her own bills and finances.She has a daughter in the area that checks on her regularly. There is a son from out of town who plans to provide assistance as needed.Cranial CT reviewed, showing SAH. Per report, Cervical spine and maxillofacial films revealed acute moderate volume subarachnoid hemorrhage. Acute open fracture through anterior maxilla including alveolar ridge with teeth 7, 8 and 9 avulsion fractures. Acute right nasal bone, osseous nasal septum fracture. Facial contusions and a 4 x 4 x 2.5 cm right anterior facial hematoma. Acute nondisplaced C4 and C5 spinous process fractures. Conservative care of traumatic subarachnoid hemorrhage. Underwent extraction of multiple teeth 11/28/2017. Soft collar for C4 and C5 spinous process fractures for comfort. Incidental findings of right carotid stenosis await plan for CTA of neck versus follow-up as outpatient. Hospital course pain management.  Acute blood loss anemia 8.9 and monitored. Follow cranial CT scan of the head 11/27/2017 stable.  I will follow-up x-rays of right wrist hand as well as left wrist and hand unremarkable.  There was a lucency of right index finger likely felt to be gout no plan for MRI at this time.  Subcutaneous Lovenox added for DVT prophylaxis 12/01/2017.  Patient with bouts of urinary retention placed on low-dose Urecholine.  Dysphasia #1 thin liquid diet.Therapy  evaluations completed with recommendations of physical medicine rehab consult.  Patient was admitted for a comprehensive rehab program.  SLP evaluation was completed on 12/04/2017 with the following results:  Pt presents with behaviors consistent with RL VIII demonstrating decreased selective attention, semi-complex problem solving, memory, and emergent awareness of deficits.  As a result, pt needed min assist-supervision verbal cues to complete task items.  Pt would benefit from skilled ST while inpatient in order to maximize functional independence and reduce burden of care prior to discharge.       Skilled Therapeutic Interventions          Cognitive-linguistic evaluation completed with results and recommendations reviewed with patient and family.     SLP Assessment  Patient will need skilled [place] Pathology Services during CIR admission    Recommendations  Recommendations for Other Services: Neuropsych consult Patient destination: Home Follow up Recommendations: Other (comment)(TBD) Equipment Recommended: None recommended by SLP    SLP Frequency 3 to 5 out of 7 days   SLP Duration  SLP Intensity  SLP Treatment/Interventions 7-10 days   Minumum of 1-2 x/day, 30 to 90 minutes  Cueing hierarchy;Cognitive remediation/compensation;Internal/external aids;Environmental controls;Functional tasks;Patient/family education    Pain Pain Assessment Pain Scale: 0-10 Pain Score: 0-No pain  Prior Functioning Cognitive/Linguistic Baseline: Within functional limits Type of Home: House  Lives With: Alone Available Help at Discharge: Family;Available 24 hours/day Vocation: Retired  Function:  Eating Eating                 Cognition Comprehension Comprehension assist level: Follows complex conversation/direction with no assist  Expression   Expression assist level: Expresses basic needs/ideas: With no assist  Social Interaction Social Interaction assist level: Interacts  appropriately with others with medication or extra time (anti-anxiety, antidepressant).  Problem Solving Problem solving assist level: Solves basic 75 - 89% of the time/requires cueing 10 - 24% of the time  Memory Memory assist level: Recognizes or recalls 75 - 89% of the time/requires cueing 10 - 24% of the time   Short Term Goals: Week 1: SLP Short Term Goal 1 (Week 1): STG=LTG due to ELOS   Refer to Care Plan for Long Term Goals  Recommendations for other services: Neuropsych  Discharge Criteria: Patient will be discharged from SLP if patient refuses treatment 3 consecutive times without medical reason, if treatment goals not met, if there is a change in medical status, if patient makes no progress towards goals or if patient is discharged from hospital.  The above assessment, treatment plan, treatment alternatives and goals were discussed and mutually agreed upon: by patient and by family  Emilio Math 12/04/2017, 3:38 PM

## 2017-12-05 ENCOUNTER — Inpatient Hospital Stay (HOSPITAL_COMMUNITY): Payer: Medicare HMO | Admitting: Occupational Therapy

## 2017-12-05 ENCOUNTER — Inpatient Hospital Stay (HOSPITAL_COMMUNITY): Payer: Medicare HMO | Admitting: Physical Therapy

## 2017-12-05 ENCOUNTER — Inpatient Hospital Stay (HOSPITAL_COMMUNITY): Payer: Medicare HMO | Admitting: Speech Pathology

## 2017-12-05 LAB — GLUCOSE, CAPILLARY
Glucose-Capillary: 110 mg/dL — ABNORMAL HIGH (ref 65–99)
Glucose-Capillary: 125 mg/dL — ABNORMAL HIGH (ref 65–99)
Glucose-Capillary: 83 mg/dL (ref 65–99)
Glucose-Capillary: 114 mg/dL — ABNORMAL HIGH (ref 65–99)

## 2017-12-05 MED ORDER — CHLORHEXIDINE GLUCONATE 0.12 % MT SOLN
OROMUCOSAL | Status: AC
  Filled 2017-12-05: qty 15

## 2017-12-05 NOTE — Progress Notes (Signed)
Social Work  Social Work Assessment and Plan  Patient Details  Name: Sandy Owens MRN: [id number] Date of Birth: 26-Oct-1923  Today's Date: 12/05/2017  Problem List:  Patient Active Problem List   Diagnosis Date Noted  . Focal traumatic brain injury with LOC of 30 minutes or less, sequela ([place]) 12/03/2017  . Hypothyroidism   . History of gout   . Urinary retention   . Fall   . Hypoxia   . Lip laceration   . Pain   . Subarachnoid hemorrhage ([place])   . History of breast cancer   . Coronary artery disease involving coronary bypass graft of native heart without angina pectoris   . Benign essential HTN   . Type 2 diabetes mellitus with peripheral neuropathy (HCC)   . Acute idiopathic gout of right foot   . Oropharyngeal dysphagia   . Leukocytosis   . Acute blood loss anemia   . Stage 3 chronic kidney disease ([place])   . Multiple trauma 11/26/2017  . Breast cancer ([place]) 06/06/2014  . Cancer of breast, female ([place]) 04/13/2014   Past Medical History:  Past Medical History:  Diagnosis Date  . Cancer [facility]    left breast  . Coronary artery disease    CABG - 3- 2001  . Diabetes mellitus without complication ([place])   . Hypertension   . Hypothyroidism   . Neuromuscular disorder ([place])    neuropathy in feet  and legs   Past Surgical History:  Past Surgical History:  Procedure Laterality Date  . ABDOMINAL HYSTERECTOMY    . APPENDECTOMY    . BACK SURGERY    . cabg    . HIP SURGERY      right hip replacement  . JOINT REPLACEMENT     left knee replacement  . MASTECTOMY Left   . MULTIPLE EXTRACTIONS WITH ALVEOLOPLASTY N/A 11/28/2017   Procedure: MULTIPLE EXTRACTION WITH ALVEOLOPLASTY;  Surgeon: Diona Browner, DDS;  Location: [place];  Service: Oral Surgery;  Laterality: N/A;  . SIMPLE MASTECTOMY WITH AXILLARY SENTINEL NODE BIOPSY Left 06/06/2014   Procedure: LEFT SIMPLE MASTECTOMY;  Surgeon: Excell Seltzer, MD;  Location: [place];  Service: General;   Laterality: Left;   Social History:  reports that she has quit smoking. She has never used smokeless tobacco. She reports that she drank alcohol. She reports that she does not use drugs.  Family / Support Systems Marital Status: Divorced Patient Roles: Parent, Other (Comment)(grandparent) Children: daughter, Lawerance Cruel @ (C) [phone];  son, Ed - living in [place] Anticipated Caregiver: daughter and son Ability/Limitations of Caregiver: daughter is retired and lives very close to pt and available to provide any needed assistance;  son here from [place] until 4/11 and plans to return, however, he states he can come back and forth as needed for pt. Caregiver Availability: 24/7 Family Dynamics: Family extremely supportive and able/ willing to provide 24/7 support.  Social History Preferred language: English Religion: Protestant Cultural Background: NA Read: Yes Write: Yes Employment Status: Retired Date Retired/Disabled/Unemployed: late 80's Freight forwarder Issues: None Guardian/Conservator: None - per MD, pt is capable of making decisions on her own behalf.   Abuse/Neglect Abuse/Neglect Assessment Can Be Completed: Yes Physical Abuse: Denies Verbal Abuse: Denies Sexual Abuse: Denies Exploitation of patient/patient's resources: Denies Self-Neglect: Denies  Emotional Status Pt's affect, behavior adn adjustment status: Pt lying in bed and reports tired from full day of therapies.  Very pleasant and talkative.  Admits frustration with her fall and the  resulting injuries and states, "I'm not allowed to go out and get my mail by myself anymore." (laughing).  Pt denies any significant emotional distress and son reports that her cognition appears at baseline and describes her as very motivated.  Will monito and refer for addition support if needed. Recent Psychosocial Issues: None Pyschiatric History: none Substance Abuse History: None  Patient / Family Perceptions, Expectations &  Goals Pt/Family understanding of illness & functional limitations: Pt and family with good understanding of her injuries and current functional limitations/ need for CIR. Premorbid pt/family roles/activities: Pt was completely independent, however, does note that daughter checks by daily and will assist if needed with laundry, housekeeping, etc. Anticipated changes in roles/activities/participation: Daughter likely to stay with pt and provide caregiver support initally at d/c. Pt/family expectations/goals: "I can't wait to get out of this thing (collar)."  Recruitment consultant: None Premorbid Home Care/DME Agencies: None Transportation available at discharge: yes  Discharge Planning Living Arrangements: Alone Support Systems: Children Type of Residence: Private residence Insurance Resources: Medicare([employer] Medicare) Financial Resources: Radio broadcast assistant Screen Referred: No Living Expenses: Own Money Management: Patient Does the patient have any problems obtaining your medications?: No Home Management: pt and daughter Patient/Family Preliminary Plans: Pt expects to return to her own home with daughter to stay initially. Social Work Anticipated Follow Up Needs: HH/OP Expected length of stay: 7-10 days  Clinical Impression Very pleasant, elderly woman here with several injuries following a fall at home.  Making good gains and family feels her cognition is "as good as it always has been".  Family very supportive and daughter able to provide 24/7 assistance if needed.  Goals being set for mod independent.  No emotional distress noted/ reported.  SW to follow for d/c planning needs.  Laird Runnion 12/05/2017, 4:42 PM

## 2017-12-05 NOTE — Progress Notes (Signed)
Speech Language Pathology Daily Session Note  Patient Details  Name: Sandy Owens MRN: [id number] Date of Birth: 21-Sep-1923  Today's Date: 12/05/2017 SLP Individual Time: 0900-0930 SLP Individual Time Calculation (min): 30 min  Short Term Goals: Week 1: SLP Short Term Goal 1 (Week 1): STG=LTG due to ELOS   Skilled Therapeutic Interventions:  Pt was seen for skilled ST targeting cognitive goals.  SLP provided pt with a large print, simplified schedule to facilitate recall of upcoming therapy sessions (pt with baseline decreased visual acuity but can read with large print and bifocal lenses) which pt could use with supervision.  SLP also facilitated the session with medication management tasks to address recall of complex, new information.  Pt needed min assist to recall function of medications when named.  Pt was left with a list of currently scheduled medications.  Pt left in bed with bed alarm set and call bell within reach.  Family at bedside.  Continue per current plan of care.    Function:  Eating Eating                 Cognition Comprehension Comprehension assist level: Follows complex conversation/direction with no assist  Expression   Expression assist level: Expresses complex ideas: With extra time/assistive device  Social Interaction Social Interaction assist level: Interacts appropriately with others with medication or extra time (anti-anxiety, antidepressant).  Problem Solving Problem solving assist level: Solves basic 75 - 89% of the time/requires cueing 10 - 24% of the time  Memory Memory assist level: Recognizes or recalls 75 - 89% of the time/requires cueing 10 - 24% of the time    Pain Pain Assessment Pain Scale: 0-10 Pain Score: 0-No pain  Therapy/Group: Individual Therapy  Lillyana Majette, Selinda Orion 12/05/2017, 9:41 AM

## 2017-12-05 NOTE — Plan of Care (Signed)
  Problem: Consults Goal: RH GENERAL PATIENT EDUCATION Description See Patient Education module for education specifics. Outcome: Progressing   Problem: RH BOWEL ELIMINATION Goal: RH STG MANAGE BOWEL WITH ASSISTANCE Description STG Manage Bowel with  Mod  I Assistance.  Outcome: Progressing Goal: RH STG MANAGE BOWEL W/MEDICATION W/ASSISTANCE Description STG Manage Bowel with Medication with mod I Assistance.  Outcome: Progressing   Problem: RH BLADDER ELIMINATION Goal: RH STG MANAGE BLADDER WITH MEDICATION WITH ASSISTANCE Description STG Manage Bladder With Medication With  Mod I Assistance.  Outcome: Progressing   Problem: RH SAFETY Goal: RH STG ADHERE TO SAFETY PRECAUTIONS W/ASSISTANCE/DEVICE Description STG Adhere to Safety Precautions With supervision/cues Assistance/Device.  Outcome: Progressing   Problem: RH KNOWLEDGE DEFICIT GENERAL Goal: RH STG INCREASE KNOWLEDGE OF SELF CARE AFTER HOSPITALIZATION Description Family will be able to manage care of patient at discharge using cues/resources independently  Outcome: Progressing

## 2017-12-05 NOTE — Progress Notes (Signed)
[facility] PH[facility]ICAL [facility]ICINE & REHABILITATION     PROGRESS NOTE    Subjective/Complaints: Overall doing fairly well.  States that she had some shortness of breath overnight.  Family stated that when they saw her she was bent over and almost a fetal position.  Patient states that she did not have any shortness of breath during therapy yesterday.  ROS: Patient denies fever, rash, sore throat, blurred vision, nausea, vomiting, diarrhea, cough, shortness of breath or chest pain,  or mood change.   Objective: Vital Signs: Blood pressure (!) 163/62, pulse (!) 58, temperature 97.7 F (36.5 C), temperature source Oral, resp. rate 18, height 5' (1.524 m), weight 62.3 kg (137 lb 5.6 oz), SpO2 94 %. No results found. Recent Labs    12/03/17 1133 12/04/17 1110  WBC 9.9 10.8*  HGB 10.3* 10.9*  HCT 30.8* 32.2*  PLT 291 310   Recent Labs    12/03/17 1133 12/04/17 1110  NA 132* 135  K 4.1 4.0  CL 98* 100*  GLUCOSE 144* 126*  BUN 18 17  CREATININE 1.07* 1.00  CALCIUM 8.4* 8.5*   CBG (last 3)  Recent Labs    12/04/17 1623 12/04/17 2129 12/05/17 0636  GLUCAP 120* 171* 110*    Wt Readings from Last 3 Encounters:  12/03/17 62.3 kg (137 lb 5.6 oz)  11/27/17 59.8 kg (131 lb 13.4 oz)  12/31/15 62.1 kg (137 lb)    Physical Exam:  Constitutional: No distress . Vital signs reviewed. HEENT: EOMI, oral membranes moist.  Bruising and swelling of her right maxilla Cardiovascular: RRR without murmur. No JVD    Respiratory: CTA Bilaterally without wheezes or rales. Normal effort    GI: BS +, non-tender, non-distended   Musculoskeletal:  Tenderness bilaterally feet  with improvement Neurological:  Alert, oriented x 3.  Motor: RUE: 4+/5 proximal to distal LUE: 4/5 proximal to distal LLE: 4--4/5 proximal to distal RLE: 4--4/5 proximal to distal--motor exam unchanged from yesterday Skin:  Skin.  Healing abrasions on legs/arms  Psychiatric: Pleasant and more engaging  today.    Assessment/Plan: 1. Functional deficits secondary to traumatic SAH which require 3+ hours per day of interdisciplinary therapy in a comprehensive inpatient rehab setting. Physiatrist is providing close team supervision and 24 hour management of active medical problems listed below. Physiatrist and rehab team continue to assess barriers to discharge/monitor patient progress toward functional and medical goals.  Function:  Bathing Bathing position   Position: Wheelchair/chair at sink  Bathing parts Body parts bathed by patient: Right arm, Left arm, Chest, Abdomen, Front perineal area, Buttocks, Right upper leg, Left upper leg Body parts bathed by helper: Right lower leg, Left lower leg, Back  Bathing assist Assist Level: Touching or steadying assistance(Pt > 75%)      Upper Body Dressing/Undressing Upper body dressing   What is the patient wearing?: Pull over shirt/dress     Pull over shirt/dress - Perfomed by patient: Thread/unthread right sleeve, Thread/unthread left sleeve, Pull shirt over trunk Pull over shirt/dress - Perfomed by helper: Put head through opening        Upper body assist Assist Level: Touching or steadying assistance(Pt > 75%)      Lower Body Dressing/Undressing Lower body dressing   What is the patient wearing?: Pants, Ted Hose, Non-skid slipper socks     Pants- Performed by patient: Thread/unthread right pants leg, Thread/unthread left pants leg Pants- Performed by helper: Pull pants up/down   Non-skid slipper socks- Performed by helper: Don/doff right sock,  Don/doff left sock               TED Hose - Performed by helper: Don/doff right TED hose, Don/doff left TED hose  Lower body assist Assist for lower body dressing: Touching or steadying assistance (Pt > 75%)      Toileting Toileting   Toileting steps completed by patient: Adjust clothing prior to toileting, Adjust clothing after toileting, Performs perineal hygiene Toileting  steps completed by helper: Performs perineal hygiene Toileting Assistive Devices: Grab bar or rail  Toileting assist Assist level: Touching or steadying assistance (Pt.75%)   Transfers Chair/bed transfer   Chair/bed transfer method: Stand pivot Chair/bed transfer assist level: Touching or steadying assistance (Pt > 75%) Chair/bed transfer assistive device: Bedrails     Locomotion Ambulation     Max distance: 11' Assist level: Touching or steadying assistance (Pt > 75%)   Wheelchair          Cognition Comprehension Comprehension assist level: Follows complex conversation/direction with no assist  Expression Expression assist level: Expresses complex ideas: With extra time/assistive device  Social Interaction Social Interaction assist level: Interacts appropriately with others with medication or extra time (anti-anxiety, antidepressant).  Problem Solving Problem solving assist level: Solves basic 75 - 89% of the time/requires cueing 10 - 24% of the time  Memory Memory assist level: Recognizes or recalls 75 - 89% of the time/requires cueing 10 - 24% of the time   Medical Problem List and Plan: 1.  Decreased functional mobility secondary to [facility] as well as multiple facial fractures, nasal septum fracture with facial contusions.  Acute nondisplaced C4 and C5 spinous process fractures-soft cervical collar for comfort.  Multiple tooth extractions after fall 11/26/2017   -Continue therapies with physical occupational and speech therapy. 2.  DVT Prophylaxis/Anticoagulation: Subcutaneous Lovenox initiated 12/01/2017.    -vascular study was negative on 4/4 3. Pain Management: Neurontin 100 mg twice daily, Anaprox 275 mg twice daily, hydrocodone/Ultram as needed 4. Mood: Provide emotional support 5. Neuropsych: This patient is capable of making decisions on her own behalf. 6. Skin/Wound Care: Routine skin checks 7. Fluids/Electrolytes/Nutrition:  .   -Continue to encourage intake.     -Recheck  sodium on Monday 8.  Acute blood loss anemia.  Latest hemoglobin 8.9.    -Recheck lab work on Monday 9.  Urinary retention.  Urecholine 5 mg 3 times daily.     -Appears to be voiding regularly.  Will stop Urecholine and observe 10.  Hypertension.  Norvasc 5 mg daily, Tenormin 50 mg twice daily, lisinopril 5 mg daily.  Monitor with increased mobility 11.  History of gout.  Colchicine 0.6 mg daily 12.  History of CAD with CABG 2001. Continue to monitor 13.  History of left breast cancer with mastectomy 14.  Hypothyroidism.  Synthroid 15.  Diabetes mellitus.  follow CBG's. Continue SSI  - Patient on Amaryl 1 mg daily prior to admission  -Fair control at present.  Consider resuming Amaryl soon 16. Shortness of breath:   -  I suspect a lot of systems are related to her maxillo-facial trauma/dysphagia.  Also appears to be an anxiety component at HS   -wbc's down to 9.9 most recently--> recheck Monday   -IS, OOB reassurance,    -robitussin dm scheduled   -observe clinically, low threshold to re-check CXR      LOS (Days) [place]  Meredith Staggers, MD 12/05/2017 9:56 AM

## 2017-12-05 NOTE — Care Management Note (Signed)
Inpatient Rehabilitation Center Individual Statement of Services  Patient Name:  Sandy Owens  Date:  12/05/2017  Welcome to the [facility].  Our goal is to provide you with an individualized program based on your diagnosis and situation, designed to meet your specific needs.  With this comprehensive rehabilitation program, you will be expected to participate in at least 3 hours of rehabilitation therapies Monday-Friday, with modified therapy programming on the weekends.  Your rehabilitation program will include the following services:  Physical Therapy (PT), Occupational Therapy (OT), Speech Therapy (ST), 24 hour per day rehabilitation nursing, Therapeutic Recreaction (TR), Neuropsychology, Case Management (Social Worker), Rehabilitation Medicine, Nutrition Services and Pharmacy Services  Weekly team conferences will be held on Tuesdays to discuss your progress.  Your Social Worker will talk with you frequently to get your input and to update you on team discussions.  Team conferences with you and your family in attendance may also be held.  Expected length of stay: 7-10 days    Overall anticipated outcome: modified independent  Depending on your progress and recovery, your program may change. Your Social Worker will coordinate services and will keep you informed of any changes. Your Social Worker's name and contact numbers are listed  below.  The following services may also be recommended but are not provided by the [facility] will be made to provide these services after discharge if needed.  Arrangements include referral to agencies that provide these services.  Your insurance has been verified to be:  [place] primary doctor is:  [employer] account planner  Pertinent information will be shared with your doctor and your insurance  company.  Social Worker:  [place], [place] or [phone]   Information discussed with and copy given to patient by: Lennart Pall, 12/05/2017, 4:31 PM

## 2017-12-05 NOTE — Progress Notes (Signed)
Occupational Therapy Session Note  Patient Details  Name: Sandy Owens MRN: [id number] Date of Birth: 30-Sep-1923  Today's Date: 12/05/2017 OT Individual Time: 1300-1357 OT Individual Time Calculation (min): 57 min    Short Term Goals: Week 1:  OT Short Term Goal 1 (Week 1): STGs=LTGs secondary to short estimated LOS  Skilled Therapeutic Interventions/Progress Updates:    Pt greeted supine in bed. Requesting to to complete BADLs. Tx focus on balance, activity tolerance, and functional ambulation with device during bathing, dressing, and grooming tasks. B/D completed EOB with PA consent to remove soft c-collar during bathing tasks and when in bed per pt comfort. She required increased LB assist due to c/o dizziness when bending forward. Min A sit<stand with RW and for short distance ambulation to sink to brush hair in standing. She then ambulated to bedside recliner with Min A and was repositioned for comfort. Pt left with family and all needs at session exit.   Therapy Documentation Precautions:  Precautions Precautions: Fall Precaution Comments: Low vision at baseline; BPPV Required Braces or Orthoses: Other Brace/Splint Other Brace/Splint: soft collar for comfort Restrictions Weight Bearing Restrictions: No Vital Signs: Therapy Vitals Temp: 97.6 F (36.4 C) Temp Source: Oral Pulse Rate: (!) 55 Resp: 16 BP: (!) 162/57 Patient Position (if appropriate): Lying Oxygen Therapy SpO2: 97 % O2 Device: Room Air ADL:      See Function Navigator for Current Functional Status.   Therapy/Group: Individual Therapy  Yahayra Geis A Khyleigh Furney 12/05/2017, 3:48 PM

## 2017-12-05 NOTE — Progress Notes (Signed)
Speech Language Pathology Daily Session Note  Patient Details  Name: Sandy Owens MRN: [id number] Date of Birth: [birth year]  Today's Date: 12/05/2017 SLP Individual Time: 1130-1200 SLP Individual Time Calculation (min): 30 min  Short Term Goals: Week 1: SLP Short Term Goal 1 (Week 1): STG=LTG due to ELOS   Skilled Therapeutic Interventions: Skilled treatment session focused on cognitive goals. SLP facilitated session by providing Supervision verbal cues for problem solving during a complex medication management task. Pt demonstrated selective attention in a mildly distracting environment for ~45 minutes with Mod I. Pt left upright in bed with alarm on and son present. Continue with current plan of care.      Function:  Cognition Comprehension Comprehension assist level: Follows complex conversation/direction with extra time/assistive device  Expression   Expression assist level: Expresses basic needs/ideas: With extra time/assistive device  Social Interaction Social Interaction assist level: Interacts appropriately with others with medication or extra time (anti-anxiety, antidepressant).  Problem Solving Problem solving assist level: Solves complex 90% of the time/cues < 10% of the time  Memory Memory assist level: Recognizes or recalls 90% of the time/requires cueing < 10% of the time    Pain Pain Assessment Pain Scale: 0-10 Pain Score: 0-No pain  Therapy/Group: Individual Therapy  Meredeth Ide  SLP - Student 12/05/2017, 12:10 PM

## 2017-12-05 NOTE — IPOC Note (Signed)
Overall Plan of Care [facility]) Patient Details Name: Sandy Owens MRN: [id number] DOB: 02-13-1924  Admitting Diagnosis: Focal traumatic brain injury with LOC of 30 minutes or less, sequela [facility])  Hospital Problems: Principal Problem:   Focal traumatic brain injury with LOC of 30 minutes or less, sequela ([place]) Active Problems:   Benign essential HTN   Oropharyngeal dysphagia   Stage 3 chronic kidney disease ([place])   Urinary retention     Functional Problem List: Nursing Bowel, Edema, Endurance, Medication Management, Nutrition, Sensory, Skin Integrity  PT Balance, Endurance, Sensory  OT Balance, Safety, Cognition, Endurance, Motor, Pain  SLP Cognition  TR         Basic ADL's: OT Grooming, Bathing, Dressing, Toileting     Advanced  ADL's: OT       Transfers: PT Bed Mobility, Bed to Chair, Car, [employer], Floor  OT Toilet     Locomotion: PT Ambulation, Stairs     Additional Impairments: OT    SLP Social Cognition   Problem Solving, Memory, Attention, Awareness  TR      Anticipated Outcomes Item Anticipated Outcome  Self Feeding n/a  Swallowing      Basic self-care  mod I  Toileting  mod I    Bathroom Transfers mod I   Bowel/Bladder  managed with mod I assist  Transfers  Mod I with RW  Locomotion  Mod I with RW  Communication     Cognition  mod I   Pain     Safety/Judgment  Managed with supervision/cues   Therapy Plan: PT Intensity: Minimum of 1-2 x/day ,45 to 90 minutes PT Frequency: 5 out of 7 days PT Duration Estimated Length of Stay: 7-10 days OT Intensity: Minimum of 1-2 x/day, 45 to 90 minutes OT Frequency: 5 out of 7 days OT Duration/Estimated Length of Stay: 7-10 days SLP Intensity: Minumum of 1-2 x/day, 30 to 90 minutes SLP Frequency: 3 to 5 out of 7 days SLP Duration/Estimated Length of Stay: 7-10 days     Team Interventions: Nursing Interventions Patient/Family Education, Medication Management, Discharge Planning, Bowel  Management, Skin Care/Wound Management, Disease Management/Prevention  PT interventions Ambulation/gait training, Balance/vestibular training, Discharge planning, DME/adaptive equipment instruction, Functional mobility training, Neuromuscular re-education, Pain management, Patient/family education, Psychosocial support, Stair training, Therapeutic Activities, Therapeutic Exercise, UE/LE Strength taining/ROM, UE/LE Coordination activities, Visual/perceptual remediation/compensation, Wheelchair propulsion/positioning  OT Interventions Balance/vestibular training, Discharge planning, Pain management, Self Care/advanced ADL retraining, Therapeutic Activities, UE/LE Coordination activities, Cognitive remediation/compensation, Functional mobility training, Patient/family education, Therapeutic Exercise, Community reintegration, DME/adaptive equipment instruction, Neuromuscular re-education, Psychosocial support, UE/LE Strength taining/ROM  SLP Interventions Cueing hierarchy, Cognitive remediation/compensation, Internal/external aids, Environmental controls, Functional tasks, Patient/family education  TR Interventions    SW/CM Interventions Discharge Planning, Psychosocial Support, Patient/Family Education   Barriers to Discharge MD  Medical stability  Nursing      PT Decreased caregiver support    OT (none known at this time)    SLP      SW       Team Discharge Planning: Destination: PT-Home ,OT- Home , SLP-Home Projected Follow-up: PT-Home health PT, OT-  Home health OT, 24 hour supervision/assistance, SLP-Other (comment)(TBD) Projected Equipment Needs: PT-Rolling walker with 5" wheels, OT- To be determined, SLP-None recommended by SLP Equipment Details: PT-pt already owns RW, OT-  Patient/family involved in discharge planning: PT- Patient, Family Midwife,  OT-Patient, Family member/caregiver, SLP-Patient, Family member/caregiver  MD ELOS: 7-10 days Medical Rehab Prognosis:   Excellent Assessment: The patient has been admitted for CIR  therapies with the diagnosis of TBI. The team will be addressing functional mobility, strength, stamina, balance, safety, adaptive techniques and equipment, self-care, bowel and bladder mgt, patient and caregiver education, NMR, swallowing, cognition, communication. Goals have been set at mod I for mobility, self-care, swalowing and cognition.    Meredith Staggers, MD, FAAPMR      See Team Conference Notes for weekly updates to the plan of care

## 2017-12-05 NOTE — Progress Notes (Signed)
Physical Therapy Session Note  Patient Details  Name: Sandy Owens MRN: [id number] Date of Birth: 02-Dec-1923  Today's Date: 12/05/2017 PT Individual Time: 1415-1530 PT Individual Time Calculation (min): 75 min   Short Term Goals: Week 1:  PT Short Term Goal 1 (Week 1): =LTG due to ELOS  Skilled Therapeutic Interventions/Progress Updates:    Pt seated in recliner in room, reports feeling fatigued but is agreeable to participate in therapy session. Educated pt on brain injury and how that can be affecting her fatigue levels, pt states understanding. Pt reports no pain this PM. Stand pivot transfer recliner to w/c with CGA with use of RW. Ambulation 2 x 100 ft with RW and CGA for balance. Pt exhibits improved endurance for gait this date. Berg balance test: 17/56, high fall risk. Pt is unable to perform some aspects of balance test due to increased dizziness and decreased balance with turns. Nustep level 2 x 4 min, x 2 min with BLE only. Assisted pt back to bed at end of session, SBA for bed mobility. Pt left semi-reclined in bed with bed alarm in place and NA present to take vitals.  Therapy Documentation Precautions:  Precautions Precautions: Fall Precaution Comments: Low vision at baseline; BPPV Required Braces or Orthoses: Other Brace/Splint Other Brace/Splint: soft collar for comfort Restrictions Weight Bearing Restrictions: No    Balance: Balance Balance Assessed: Yes Standardized Balance Assessment Standardized Balance Assessment: Berg Balance Test Berg Balance Test Sit to Stand: Needs minimal aid to stand or to stabilize Standing Unsupported: Needs several tries to stand 30 seconds unsupported Sitting with Back Unsupported but Feet Supported on Floor or Stool: Able to sit safely and securely 2 minutes Stand to Sit: Uses backs of legs against chair to control descent Transfers: Needs one person to assist Standing Unsupported with Eyes Closed: Able to stand 10 seconds with  supervision Standing Ubsupported with Feet Together: Needs help to attain position but able to stand for 30 seconds with feet together From Standing, Reach Forward with Outstretched Arm: Loses balance while trying/requires external support From Standing Position, Pick up Object from Floor: Unable to try/needs assist to keep balance From Standing Position, Turn to Look Behind Over each Shoulder: Needs supervision when turning Turn 360 Degrees: Needs assistance while turning Standing Unsupported, Alternately Place Feet on Step/Stool: Able to complete >2 steps/needs minimal assist Standing Unsupported, One Foot in Front: Needs help to step but can hold 15 seconds Standing on One Leg: Tries to lift leg/unable to hold 3 seconds but remains standing independently Total Score: 17  See Function Navigator for Current Functional Status.   Therapy/Group: Individual Therapy  Excell Seltzer, PT, DPT  12/05/2017, 3:57 PM

## 2017-12-06 ENCOUNTER — Inpatient Hospital Stay (HOSPITAL_COMMUNITY): Payer: Medicare HMO | Admitting: Physical Therapy

## 2017-12-06 ENCOUNTER — Inpatient Hospital Stay (HOSPITAL_COMMUNITY): Payer: Medicare HMO | Admitting: Speech Pathology

## 2017-12-06 ENCOUNTER — Inpatient Hospital Stay (HOSPITAL_COMMUNITY): Payer: Medicare HMO | Admitting: Occupational Therapy

## 2017-12-06 LAB — GLUCOSE, CAPILLARY
Glucose-Capillary: 110 mg/dL — ABNORMAL HIGH (ref 65–99)
Glucose-Capillary: 106 mg/dL — ABNORMAL HIGH (ref 65–99)
Glucose-Capillary: 100 mg/dL — ABNORMAL HIGH (ref 65–99)
Glucose-Capillary: 156 mg/dL — ABNORMAL HIGH (ref 65–99)

## 2017-12-06 NOTE — Progress Notes (Signed)
Occupational Therapy Session Note  Patient Details  Name: Sandy Owens MRN: [id number] Date of Birth: 03-08-1924  Today's Date: 12/06/2017 OT Individual Time: 5009-3818 OT Individual Time Calculation (min): 57 min   Short Term Goals: Week 1:  OT Short Term Goal 1 (Week 1): STGs=LTGs secondary to short estimated LOS  Skilled Therapeutic Interventions/Progress Updates:    Pt greeted in w/c. Reported fatigue but no dizziness. Declining B/D. Tx focus on balance, standing tolerance, and walker safety during ADL/IADL participation. After she completed grooming tasks/oral care while standing at sink, pt was escorted to dayroom. For remainder of session, pt engaged in table washing. She side stepped in front of large elevated table with steady assist and mod vcs.for RW mgt. During one seated rest break, worked on gentle back stretches with yoga-base for decreasing back pain/discomfort. Pt reporting that dizziness was "much better" when ambulating today compared to previous therapies. At end of tx pt was taken back to room and left in w/c with family present.   Used self-selected music for enhancing psychosocial wellness during session.    Therapy Documentation Precautions:  Precautions Precautions: Fall Precaution Comments: Low vision at baseline; BPPV Required Braces or Orthoses: Other Brace/Splint Other Brace/Splint: soft collar for comfort Restrictions Weight Bearing Restrictions: No Pain: c/o back pain when standing for >3 minutes. Completed gentle back stretches to address during tx. Pain subsided with seated rest.   ADL:    See Function Navigator for Current Functional Status.   Therapy/Group: Individual Therapy  [name] A Glennon Kopko 12/06/2017, 12:47 PM

## 2017-12-06 NOTE — Progress Notes (Signed)
Speech Language Pathology Daily Session Note  Patient Details  Name: Sandy Owens MRN: [id number] Date of Birth: 10-03-1923  Today's Date: 12/06/2017 SLP Individual Time: 5974-1638 SLP Individual Time Calculation (min): 45 min  Short Term Goals: Week 1: SLP Short Term Goal 1 (Week 1): STG=LTG due to ELOS   Skilled Therapeutic Interventions: Skilled treatment session focused on cognition goals. SLP facilitated session by providing Min A to supervision cues to complete medication management that pt had started on previous session. Pt with good self-monitoring and successful attempts to self-correct. Pt demonstrated selective attention in moderately distracting environment for ~ 40 minutes. After session with SLP,  OT stated that pt was able to independently recall our session and this writer's name as well as activities.      Function:    Cognition Comprehension Comprehension assist level: Follows complex conversation/direction with extra time/assistive device  Expression   Expression assist level: Expresses basic needs/ideas: With no assist  Social Interaction Social Interaction assist level: Interacts appropriately with others with medication or extra time (anti-anxiety, antidepressant).  Problem Solving Problem solving assist level: Solves complex 90% of the time/cues < 10% of the time  Memory Memory assist level: More than reasonable amount of time    Pain    Therapy/Group: Individual Therapy  Ronnica Dreese 12/06/2017, 9:46 AM

## 2017-12-06 NOTE — Progress Notes (Signed)
Patient ID: Sandy Owens, female   DOB: 27-Jul-1924, 84 y.o.   MRN: [id number]   Sandy Owens is a 84 y.o. female who is admitted for CIR following a traumatic SAH   Subjective: No new complaints.  Comfortable night  Past Medical History:  Diagnosis Date  . Cancer [facility])    left breast  . Coronary artery disease    CABG - 3- 2001  . Diabetes mellitus without complication ([place])   . Hypertension   . Hypothyroidism   . Neuromuscular disorder ([place])    neuropathy in feet  and legs     Objective: Vital signs in last 24 hours: Temp:  [97.6 F (36.4 C)-98.2 F (36.8 C)] 98.2 F (36.8 C) (04/06 0517) Pulse Rate:  [55-60] 60 (04/06 0805) Resp:  [16-17] 17 (04/06 0517) BP: (155-174)/(57-61) 169/58 (04/06 0805) SpO2:  [96 %-97 %] 96 % (04/06 0517) Weight change:  Last BM Date: 12/04/17  Intake/Output from previous day: 04/05 0701 - 04/06 0700 In: 240 [P.O.:240] Out: -  Last cbgs: CBG (last 3)  Recent Labs    12/05/17 1126 12/05/17 1638 12/05/17 2106  GLUCAP 125* 83 114*     Physical Exam General: No apparent distress   HEENT: Soft cervical collar in place; facial ecchymoses Lungs: Normal effort. Lungs clear to auscultation, no crackles or wheezes. Cardiovascular: Regular rate and rhythm, no edema Abdomen: S/NT/ND; BS(+) Musculoskeletal:  unchanged Neurological: No new neurological deficits  Extremities-no edema Mental state: Alert, oriented, cooperative    Lab Results: BMET    Component Value Date/Time   NA 135 12/04/2017 1110   K 4.0 12/04/2017 1110   CL 100 (L) 12/04/2017 1110   CO2 21 (L) 12/04/2017 1110   GLUCOSE 126 (H) 12/04/2017 1110   BUN 17 12/04/2017 1110   CREATININE 1.00 12/04/2017 1110   CALCIUM 8.5 (L) 12/04/2017 1110   GFRNONAA 47 (L) 12/04/2017 1110   GFRAA 55 (L) 12/04/2017 1110   CBC    Component Value Date/Time   WBC 10.8 (H) 12/04/2017 1110   RBC 3.45 (L) 12/04/2017 1110   HGB 10.9 (L) 12/04/2017 1110   HCT 32.2 (L)  12/04/2017 1110   PLT 310 12/04/2017 1110   MCV 93.3 12/04/2017 1110   MCH 31.6 12/04/2017 1110   MCHC 33.9 12/04/2017 1110   RDW 13.7 12/04/2017 1110   LYMPHSABS 2.1 12/04/2017 1110   MONOABS 0.8 12/04/2017 1110   EOSABS 0.2 12/04/2017 1110   BASOSABS 0.0 12/04/2017 1110   CBG (last 3)  Recent Labs    12/05/17 1126 12/05/17 1638 12/05/17 2106  GLUCAP 125* 83 114*    Medications: I have reviewed the patient's current medications.  Assessment/Plan:  Status post traumatic S AH with functional mobility deficits Status post nasal fracture with facial contusions and nondisplaced C4 and C5 spinous process fractures.  Continue soft cervical collar Pain management.  Continue Neurontin anti-inflammatory medicines and hydrocodone as needed Essential hypertension continue present triple therapy Diabetes mellitus.  Stable History of dyspnea.  Presently stable.  Continue to observe    Length of stay, days: 3  Nyoka Cowden , MD 12/06/2017, 9:10 AM

## 2017-12-06 NOTE — Progress Notes (Signed)
Physical Therapy Session Note  Patient Details  Name: Sandy Owens MRN: [id number] Date of Birth: 05-Dec-1923  Today's Date: 12/06/2017 PT Individual Time: 1100-1200 PT Individual Time Calculation (min): 60 min   Short Term Goals: Week 1:  PT Short Term Goal 1 (Week 1): =LTG due to ELOS  Skilled Therapeutic Interventions/Progress Updates:    Pt seated in w/c in room, agreeable to participate in therapy session. Ascend/descend 2 x 4 stairs with 2 handrails and min assist for balance. Ascend/descend 4 stairs with R handrail laterally with min assist. Ambulation x 200 ft with RW and SBA. Ambulation x 150 ft with no AD and hand-held min assist. Pt reports onset of dizziness following ambulation. BP 168/59. After seated rest break BP 155/56. Seated BLE therex x 10 reps with orange theraband. Ambulation 2 x 60 ft with RW and CGA, focus on heel strike with gait. Pt left seated in recliner in room with needs in reach, son present.  Therapy Documentation Precautions:  Precautions Precautions: Fall Precaution Comments: Low vision at baseline; BPPV Required Braces or Orthoses: Other Brace/Splint Other Brace/Splint: soft collar for comfort Restrictions Weight Bearing Restrictions: No  See Function Navigator for Current Functional Status.   Therapy/Group: Individual Therapy  Excell Seltzer, PT, DPT  12/06/2017, 12:43 PM

## 2017-12-06 NOTE — Progress Notes (Signed)
Physical Therapy Session Note  Patient Details  Name: CAMERA KRIENKE MRN: [id number] Date of Birth: [birth year]  Today's Date: 12/06/2017 PT Individual Time: 1500-1530 PT Individual Time Calculation (min): 30 min   Short Term Goals: Week 1:  PT Short Term Goal 1 (Week 1): =LTG due to ELOS  Skilled Therapeutic Interventions/Progress Updates:    Pt seated in recliner in room, agreeable to participate in therapy session. Pt reports some low back pain, not rated. Ambulation x 200 ft with RW and SBA to CGA, focus on heel strike with gait as pt tends to shuffle BLE, R>L. Supine low back stretches, pt reports relief of back pain with stretches. Pt has onset of LUE numbness while in semi-reclined position on mat, resolves once in seated position and educated pt it was possibly due to nerve compression in semi-reclined position. BP 161/67 and per RN report patient's BP has been running high during her stay. Standing BLE therex: marches, HS curls, heel/toe raises. Assisted pt back to bed at end of session, Supervision for bed mobility. Pt left semi-reclined in bed with needs in reach, bed alarm in place, son present.  Therapy Documentation Precautions:  Precautions Precautions: Fall Precaution Comments: Low vision at baseline; BPPV Required Braces or Orthoses: Other Brace/Splint Other Brace/Splint: soft collar for comfort Restrictions Weight Bearing Restrictions: No  See Function Navigator for Current Functional Status.   Therapy/Group: Individual Therapy  Excell Seltzer, PT, DPT  12/06/2017, 3:54 PM

## 2017-12-07 ENCOUNTER — Inpatient Hospital Stay (HOSPITAL_COMMUNITY): Payer: Medicare HMO | Admitting: Physical Therapy

## 2017-12-07 LAB — GLUCOSE, CAPILLARY
Glucose-Capillary: 112 mg/dL — ABNORMAL HIGH (ref 65–99)
Glucose-Capillary: 172 mg/dL — ABNORMAL HIGH (ref 65–99)
Glucose-Capillary: 122 mg/dL — ABNORMAL HIGH (ref 65–99)
Glucose-Capillary: 142 mg/dL — ABNORMAL HIGH (ref 65–99)

## 2017-12-07 NOTE — Progress Notes (Signed)
Patient ID: Sandy Owens, female   DOB: 17-Aug-1924, 84 y.o.   MRN: [id number]   Sandy Owens is a 84 y.o. female Who was admitted for CIR with functional deficits following a traumatic subarachnoid hemorrhage   Subjective: No new complaints.  Remains stable without concerns or complaints.  Slept well last night  Past Medical History:  Diagnosis Date  . Cancer [facility])    left breast  . Coronary artery disease    CABG - 3- 2001  . Diabetes mellitus without complication ([place])   . Hypertension   . Hypothyroidism   . Neuromuscular disorder ([place])    neuropathy in feet  and legs     Objective: Vital signs in last 24 hours: Temp:  [97.6 F (36.4 C)] 97.6 F (36.4 C) (04/07 0500) Pulse Rate:  [52-59] 59 (04/07 0743) Resp:  [18-20] 20 (04/07 0500) BP: (150-176)/(55-83) 176/66 (04/07 0500) SpO2:  [96 %-98 %] 98 % (04/07 0500) Weight change:  Last BM Date: 12/04/17  Intake/Output from previous day: 04/06 0701 - 04/07 0700 In: 720 [P.O.:720] Out: -  Last cbgs: CBG (last 3)  Recent Labs    12/06/17 1644 12/06/17 2143 12/07/17 0643  GLUCAP 100* 156* 112*     Physical Exam General: No apparent distress   HEENT: Fading facial contusions with soft tissue swelling and ecchymoses most marked on the right side.  Soft cervical collar Lungs: Normal effort. Lungs clear to auscultation, no crackles or wheezes. Cardiovascular: Regular rate and rhythm, no edema Abdomen: S/NT/ND; BS(+) Musculoskeletal:  unchanged Neurological: No new neurological deficits Extremities.  No edema Skin: clear   Mental state: Alert, oriented, cooperative  Patient Vitals for the past 24 hrs:  BP Temp Temp src Pulse Resp SpO2  12/07/17 0743 - - - (!) 59 - -  12/07/17 0500 (!) 176/66 97.6 F (36.4 C) Oral (!) 52 20 98 %  12/06/17 2017 (!) 158/83 - - (!) 55 - -  12/06/17 1302 (!) 150/55 97.6 F (36.4 C) Oral (!) 55 18 96 %    Lab Results: BMET    Component Value Date/Time   NA 135  12/04/2017 1110   K 4.0 12/04/2017 1110   CL 100 (L) 12/04/2017 1110   CO2 21 (L) 12/04/2017 1110   GLUCOSE 126 (H) 12/04/2017 1110   BUN 17 12/04/2017 1110   CREATININE 1.00 12/04/2017 1110   CALCIUM 8.5 (L) 12/04/2017 1110   GFRNONAA 47 (L) 12/04/2017 1110   GFRAA 55 (L) 12/04/2017 1110   CBC    Component Value Date/Time   WBC 10.8 (H) 12/04/2017 1110   RBC 3.45 (L) 12/04/2017 1110   HGB 10.9 (L) 12/04/2017 1110   HCT 32.2 (L) 12/04/2017 1110   PLT 310 12/04/2017 1110   MCV 93.3 12/04/2017 1110   MCH 31.6 12/04/2017 1110   MCHC 33.9 12/04/2017 1110   RDW 13.7 12/04/2017 1110   LYMPHSABS 2.1 12/04/2017 1110   MONOABS 0.8 12/04/2017 1110   EOSABS 0.2 12/04/2017 1110   BASOSABS 0.0 12/04/2017 1110    Medications: I have reviewed the patient's current medications.  Assessment/Plan:  Functional deficits following a traumatic subarachnoid hemorrhage.  Continue CIR Status post nasal fracture with facial contusions Status post nondisplaced C4 and C5 spinous process fractures.  Continue use of cervical collar Essential hypertension.  Systolic blood pressure a bit elevated this a.m.  We will continue to watch on present triple therapy  Diabetes mellitus.  Excellent glycemic control History of dyspnea.  No  further recurrence     Length of stay, days: [place] , MD 12/07/2017, 10:05 AM

## 2017-12-07 NOTE — Progress Notes (Signed)
Physical Therapy Note  Patient Details  Name: Sandy Owens MRN: [id number] Date of Birth: 1923-10-03 Today's Date: 12/07/2017    Time: [phone] 40 minutes  1:1 Pt with no c/o pain. Pt agreeable to vestibular assessment and treatment.  Pt assessed with soft collar on with bed in trendelenberg and min A for maintaining sidelying due to discomfort.  Pt with apparent Rt anterior canalisthesis.  Pt treated with epley maneuver with pt still with c/o dizziness but states "it's not as bad" as when maneuver was performed last week.  Pt and family educated on vestibular rehab goals, all express understanding.   Sinahi Knights 12/07/2017, 10:14 AM

## 2017-12-08 ENCOUNTER — Inpatient Hospital Stay (HOSPITAL_COMMUNITY): Payer: Medicare HMO | Admitting: Physical Therapy

## 2017-12-08 ENCOUNTER — Inpatient Hospital Stay (HOSPITAL_COMMUNITY): Payer: Medicare HMO | Admitting: Speech Pathology

## 2017-12-08 ENCOUNTER — Inpatient Hospital Stay (HOSPITAL_COMMUNITY): Payer: Medicare HMO | Admitting: Occupational Therapy

## 2017-12-08 LAB — GLUCOSE, CAPILLARY
Glucose-Capillary: 114 mg/dL — ABNORMAL HIGH (ref 65–99)
Glucose-Capillary: 122 mg/dL — ABNORMAL HIGH (ref 65–99)
Glucose-Capillary: 71 mg/dL (ref 65–99)
Glucose-Capillary: 129 mg/dL — ABNORMAL HIGH (ref 65–99)

## 2017-12-08 LAB — BASIC METABOLIC PANEL WITH GFR
Anion gap: 11 (ref 5–15)
BUN: 16 mg/dL (ref 6–20)
CO2: 25 mmol/L (ref 22–32)
Calcium: 8.3 mg/dL — ABNORMAL LOW (ref 8.9–10.3)
Chloride: 92 mmol/L — ABNORMAL LOW (ref 101–111)
Creatinine, Ser: 0.97 mg/dL (ref 0.44–1.00)
GFR calc Af Amer: 57 mL/min — ABNORMAL LOW
GFR calc non Af Amer: 49 mL/min — ABNORMAL LOW
Glucose, Bld: 104 mg/dL — ABNORMAL HIGH (ref 65–99)
Potassium: 4.2 mmol/L (ref 3.5–5.1)
Sodium: 128 mmol/L — ABNORMAL LOW (ref 135–145)

## 2017-12-08 LAB — CBC
HCT: 32 % — ABNORMAL LOW (ref 36.0–46.0)
Hemoglobin: 10.3 g/dL — ABNORMAL LOW (ref 12.0–15.0)
MCH: 30.1 pg (ref 26.0–34.0)
MCHC: 32.2 g/dL (ref 30.0–36.0)
MCV: 93.6 fL (ref 78.0–100.0)
Platelets: 360 K/uL (ref 150–400)
RBC: 3.42 MIL/uL — ABNORMAL LOW (ref 3.87–5.11)
RDW: 13.7 % (ref 11.5–15.5)
WBC: 12.6 K/uL — ABNORMAL HIGH (ref 4.0–10.5)

## 2017-12-08 MED ORDER — GLIMEPIRIDE 2 MG PO TABS
1.0000 mg | ORAL_TABLET | Freq: Every day | ORAL | Status: DC
  Administered 2017-12-08 – 2017-12-10 (×2): 1 mg via ORAL
  Filled 2017-12-08 (×3): qty 1

## 2017-12-08 NOTE — Progress Notes (Signed)
Speech Language Pathology Daily Session Note  Patient Details  Name: Sandy Owens MRN: [id number] Date of Birth: Sep 24, 1923  Today's Date: 12/08/2017 SLP Individual Time: 1300-1345 SLP Individual Time Calculation (min): 45 min  Short Term Goals: Week 1: SLP Short Term Goal 1 (Week 1): STG=LTG due to ELOS   Skilled Therapeutic Interventions: Skilled treatment session focused on cognitive goals. SLP facilitated session by providing Supervision verbal cues for problem solving and Mod A verbal and visual cues for recall of new information during a money management task. Pt demonstrated selective attention in a mildly distracting environment for 35 minutes with Mod I. At the end of session, pt requested to use the bathroom. Therefore, SLP provided Supervision verbal cues for problem solving in regards to transferring pt from wheelchair to commode. Pt transferred back to bed at end of session. Pt left supine in bed with alarm on and family members present. Continue with current plan of care.      Function:  Cognition Comprehension Comprehension assist level: Follows basic conversation/direction with extra time/assistive device  Expression   Expression assist level: Expresses basic needs/ideas: With extra time/assistive device  Social Interaction Social Interaction assist level: Interacts appropriately with others with medication or extra time (anti-anxiety, antidepressant).  Problem Solving Problem solving assist level: Solves basic 90% of the time/requires cueing < 10% of the time  Memory Memory assist level: Recognizes or recalls 50 - 74% of the time/requires cueing 25 - 49% of the time    Pain Pain Assessment Pain Score: 0-No pain  Therapy/Group: Individual Therapy  Meredeth Ide  SLP - Student 12/08/2017, 2:37 PM

## 2017-12-08 NOTE — Progress Notes (Signed)
Occupational Therapy Session Note  Patient Details  Name: Sandy Owens MRN: [id number] Date of Birth: July 17, 1924  Today's Date: 12/08/2017 OT Individual Time: 9983-3825 OT Individual Time Calculation (min): 57 min   Short Term Goals: Week 1:  OT Short Term Goal 1 (Week 1): STGs=LTGs secondary to short estimated LOS  Skilled Therapeutic Interventions/Progress Updates:    Pt greeted supine in bed, reported fatigue but amenable to shower. Tx focus on balance, functional ambulation with device, and endurance during bathing and dressing tasks. Pt ambulated with RW and supervision throughout session. Removed c-collar while seated on TTB to bathe, with pt utilizing lateral leans for pericare. Also provided her with LH sponge to wash feet without back discomfort. Dressing completed EOB with ample rest breaks. Used adaptive footstool so she could don gripper socks. Pt reported feeling exhausted by end of session and requested to return to bed. She was repositioned for comfort and left with all needs within reach. C-collar removed while she rested in bed.   Therapy Documentation Precautions:  Precautions Precautions: Fall Precaution Comments: Low vision at baseline; BPPV Required Braces or Orthoses: Other Brace/Splint Other Brace/Splint: soft collar for comfort Restrictions Weight Bearing Restrictions: No Pain: No c/o pain during session  Pain Assessment Pain Score: 0-No pain ADL:  See Function Navigator for Current Functional Status.   Therapy/Group: Individual Therapy  Virtie Bungert A Ashiyah Pavlak 12/08/2017, 12:25 PM

## 2017-12-08 NOTE — Progress Notes (Addendum)
Physical Therapy Note  Patient Details  Name: Sandy Owens MRN: [id number] Date of Birth: 1924/06/11 Today's Date: 12/08/2017    Time: 830-927 57 minutes  1:1 No c/o pain. Pt performs gait with RW 75' x 2, 50' x 2 with RW, pt requires more frequent rest breaks due to fatigue.  [place] A exercises performed for strength and balance training.  Gait without AD with min A, HHA 50' x 2 with pt c/o increased fatigue with gait without AD.  Discussed energy conservation with pt/son who verbalized understanding.  Pt performs toilet transfers with supervision with use of grab bar, supervision for clothing management and hygiene.  Pt left in bed with needs at hand, son present.  Time 2: 1130-1155 25 minutes  1:1 No c/o pain, pt c/o fatigue but is willing to participate.  Vestibular re-assessment with pt rolling both directions and performing supine <> sit with pt stating she feels "less woozy" and PT observing decreased nystagmus in all directions. Pt still with marked nystagmus with Lt rolling but pt does report a decrease in symptoms, will assess again as needed.  Pt requests to use toilet. pt performs gait in room with close supervision, supervision for toilet transfers, clothing management and hygiene.   [name] 12/08/2017, 9:29 AM

## 2017-12-08 NOTE — Progress Notes (Signed)
[facility] PHYSICAL MEDICINE & REHABILITATION     PROGRESS NOTE    Subjective/Complaints: Patient states that she is doing better each day.  She is in her bed eating breakfast this morning.  ROS: Patient denies fever, rash, sore throat, blurred vision, nausea, vomiting, diarrhea, cough, shortness of breath or chest pain, joint or back pain, headache, or mood change.    Objective: Vital Signs: Blood pressure (!) 167/56, pulse 60, temperature 98.2 F (36.8 C), temperature source Oral, resp. rate (!) 22, height 5' (1.524 m), weight 62.3 kg (137 lb 5.6 oz), SpO2 97 %. No results found. Recent Labs    12/08/17 0526  WBC 12.6*  HGB 10.3*  HCT 32.0*  PLT 360   Recent Labs    12/08/17 0526  NA 128*  K 4.2  CL 92*  GLUCOSE 104*  BUN 16  CREATININE 0.97  CALCIUM 8.3*   CBG (last 3)  Recent Labs    12/07/17 1631 12/07/17 2054 12/08/17 0647  GLUCAP 122* 142* 114*    Wt Readings from Last 3 Encounters:  12/03/17 62.3 kg (137 lb 5.6 oz)  11/27/17 59.8 kg (131 lb 13.4 oz)  12/31/15 62.1 kg (137 lb)    Physical Exam:  Constitutional: No distress . Vital signs reviewed. HEENT: EOMI, oral membranes moist.  Right maxilla remains swollen Cardiovascular: RRR without murmur. No JVD    Respiratory: CTA Bilaterally without wheezes or rales. Normal effort    GI: BS +, non-tender, non-distended   Musculoskeletal:   Feet less tender, minimal swelling Neurological:  Alert, oriented x 3.  Motor: RUE: 4+/5 proximal to distal LUE: 4/5 proximal to distal LLE: 4--4/5 proximal to distal RLE: 4--4/5 proximal to distal--motor exam unchanged from yesterday Skin:  Skin.    Improving abrasions on legs/arms  Psychiatric: Pleasant and more engaging today.    Assessment/Plan: 1. Functional deficits secondary to traumatic SAH which require 3+ hours per day of interdisciplinary therapy in a comprehensive inpatient rehab setting. Physiatrist is providing close team supervision and 24 hour  management of active medical problems listed below. Physiatrist and rehab team continue to assess barriers to discharge/monitor patient progress toward functional and medical goals.  Function:  Bathing Bathing position   Position: Sitting EOB  Bathing parts Body parts bathed by patient: Right arm, Left arm, Chest, Abdomen, Front perineal area, Buttocks, Right upper leg, Left upper leg Body parts bathed by helper: Right lower leg, Left lower leg, Back  Bathing assist Assist Level: Touching or steadying assistance(Pt > 75%)      Upper Body Dressing/Undressing Upper body dressing   What is the patient wearing?: Pull over shirt/dress     Pull over shirt/dress - Perfomed by patient: Thread/unthread right sleeve, Thread/unthread left sleeve, Put head through opening Pull over shirt/dress - Perfomed by helper: Pull shirt over trunk        Upper body assist Assist Level: Touching or steadying assistance(Pt > 75%)      Lower Body Dressing/Undressing Lower body dressing   What is the patient wearing?: Pants, Ted Hose, Non-skid slipper socks, Underwear Underwear - Performed by patient: Thread/unthread right underwear leg, Thread/unthread left underwear leg, Pull underwear up/down   Pants- Performed by patient: Thread/unthread right pants leg, Thread/unthread left pants leg, Pull pants up/down Pants- Performed by helper: Pull pants up/down   Non-skid slipper socks- Performed by helper: Don/doff right sock, Don/doff left sock               TED Hose - Performed  by helper: Don/doff right TED hose, Don/doff left TED hose  Lower body assist Assist for lower body dressing: Touching or steadying assistance (Pt > 75%)      Toileting Toileting   Toileting steps completed by patient: Adjust clothing prior to toileting, Performs perineal hygiene, Adjust clothing after toileting Toileting steps completed by helper: Performs perineal hygiene Toileting Assistive Devices: Grab bar or rail   Toileting assist Assist level: Supervision or verbal cues   Transfers Chair/bed transfer   Chair/bed transfer method: Stand pivot Chair/bed transfer assist level: Touching or steadying assistance (Pt > 75%) Chair/bed transfer assistive device: Medical sales representative     Max distance: 200' Assist level: Touching or steadying assistance (Pt > 75%)   Wheelchair          Cognition Comprehension Comprehension assist level: Follows complex conversation/direction with extra time/assistive device  Expression Expression assist level: Expresses complex ideas: With extra time/assistive device  Social Interaction Social Interaction assist level: Interacts appropriately with others with medication or extra time (anti-anxiety, antidepressant).  Problem Solving Problem solving assist level: Solves complex 90% of the time/cues < 10% of the time  Memory Memory assist level: More than reasonable amount of time   Medical Problem List and Plan: 1.  Decreased functional mobility secondary to [facility] as well as multiple facial fractures, nasal septum fracture with facial contusions.  Acute nondisplaced C4 and C5 spinous process fractures-soft cervical collar for comfort.  Multiple tooth extractions after fall 11/26/2017   -Continue PT, OT, ST 2.  DVT Prophylaxis/Anticoagulation: Subcutaneous Lovenox initiated 12/01/2017.    -vascular study was negative on 4/4 3. Pain Management: Neurontin 100 mg twice daily, Anaprox 275 mg twice daily, hydrocodone/Ultram as needed 4. Mood: Provide emotional support 5. Neuropsych: This patient is capable of making decisions on her own behalf. 6. Skin/Wound Care: Routine skin checks 7. Fluids/Electrolytes/Nutrition:  .   -Continue to encourage intake.     -Sodium back down to 128 today   -Fluid restriction of 1500 cc 8.  Acute blood loss anemia.    Hemoglobin 10.3 today 4/8   -Continue to monitor 9.  Urinary retention.  Urecholine 5 mg 3 times daily.      -Appears to be voiding regularly.  Will stop Urecholine and observe 10.  Hypertension.  Norvasc 5 mg daily, Tenormin 50 mg twice daily, lisinopril 5 mg daily.  Monitor with increased mobility 11.  History of gout.  Colchicine 0.6 mg daily 12.  History of CAD with CABG 2001. Continue to monitor 13.  History of left breast cancer with mastectomy 14.  Hypothyroidism.  Synthroid 15.  Diabetes mellitus.  follow CBG's. Continue SSI  - Patient on Amaryl 1 mg daily prior to admission  -Fair control, resume Amaryl 1 mg today 16. Shortness of breath:   -Likely related to her maxillo-facial trauma/dysphagia.  Also appears to be an anxiety component at HS   -wbc's down to 12.6 today   -IS, OOB reassurance,    -robitussin dm scheduled          LOS (Days) 5 A FACE TO FACE EVALUATION WAS PERFORMED  Meredith Staggers, MD 12/08/2017 9:59 AM

## 2017-12-08 NOTE — Plan of Care (Signed)
LTGs downgraded to supervision overall 12/08/17

## 2017-12-08 NOTE — Plan of Care (Signed)
  Problem: Consults Goal: RH GENERAL PATIENT EDUCATION Description See Patient Education module for education specifics. Outcome: Progressing   Problem: RH BOWEL ELIMINATION Goal: RH STG MANAGE BOWEL WITH ASSISTANCE Description STG Manage Bowel with  Mod  I Assistance.  Outcome: Progressing Goal: RH STG MANAGE BOWEL W/MEDICATION W/ASSISTANCE Description STG Manage Bowel with Medication with mod I Assistance.  Outcome: Progressing   Problem: RH BLADDER ELIMINATION Goal: RH STG MANAGE BLADDER WITH MEDICATION WITH ASSISTANCE Description STG Manage Bladder With Medication With  Mod I Assistance.  Outcome: Progressing   Problem: RH SAFETY Goal: RH STG ADHERE TO SAFETY PRECAUTIONS W/ASSISTANCE/DEVICE Description STG Adhere to Safety Precautions With supervision/cues Assistance/Device.  Outcome: Progressing   Problem: RH KNOWLEDGE DEFICIT GENERAL Goal: RH STG INCREASE KNOWLEDGE OF SELF CARE AFTER HOSPITALIZATION Description Family will be able to manage care of patient at discharge using cues/resources independently  Outcome: Progressing

## 2017-12-09 ENCOUNTER — Inpatient Hospital Stay (HOSPITAL_COMMUNITY): Payer: Medicare HMO | Admitting: Occupational Therapy

## 2017-12-09 ENCOUNTER — Inpatient Hospital Stay (HOSPITAL_COMMUNITY): Payer: Medicare HMO | Admitting: Physical Therapy

## 2017-12-09 ENCOUNTER — Inpatient Hospital Stay (HOSPITAL_COMMUNITY): Payer: Medicare HMO | Admitting: Speech Pathology

## 2017-12-09 DIAGNOSIS — E871 Hypo-osmolality and hyponatremia: Secondary | ICD-10-CM

## 2017-12-09 LAB — BASIC METABOLIC PANEL
Anion gap: 10 (ref 5–15)
BUN: 16 mg/dL (ref 6–20)
CO2: 24 mmol/L (ref 22–32)
Calcium: 8.4 mg/dL — ABNORMAL LOW (ref 8.9–10.3)
Chloride: 92 mmol/L — ABNORMAL LOW (ref 101–111)
Creatinine, Ser: 0.97 mg/dL (ref 0.44–1.00)
GFR calc Af Amer: 57 mL/min — ABNORMAL LOW (ref >60)
GFR calc non Af Amer: 49 mL/min — ABNORMAL LOW (ref >60)
Glucose, Bld: 71 mg/dL (ref 65–99)
Potassium: 4.1 mmol/L (ref 3.5–5.1)
Sodium: 126 mmol/L — ABNORMAL LOW (ref 135–145)

## 2017-12-09 LAB — GLUCOSE, CAPILLARY
Glucose-Capillary: 68 mg/dL (ref 65–99)
Glucose-Capillary: 89 mg/dL (ref 65–99)
Glucose-Capillary: 89 mg/dL (ref 65–99)
Glucose-Capillary: 104 mg/dL — ABNORMAL HIGH (ref 65–99)
Glucose-Capillary: 124 mg/dL — ABNORMAL HIGH (ref 65–99)

## 2017-12-09 LAB — SODIUM, URINE, RANDOM: Sodium, Ur: 70 mmol/L

## 2017-12-09 MED ORDER — SODIUM CHLORIDE 1 G PO TABS
1.0000 g | ORAL_TABLET | Freq: Two times a day (BID) | ORAL | Status: DC
  Administered 2017-12-09 – 2017-12-13 (×9): 1 g via ORAL
  Filled 2017-12-09 (×9): qty 1

## 2017-12-09 MED ORDER — ADULT MULTIVITAMIN W/MINERALS CH
1.0000 | ORAL_TABLET | Freq: Every day | ORAL | Status: DC
  Administered 2017-12-09 – 2017-12-13 (×5): 1 via ORAL
  Filled 2017-12-09 (×5): qty 1

## 2017-12-09 MED ORDER — ENSURE ENLIVE PO LIQD
237.0000 mL | Freq: Two times a day (BID) | ORAL | Status: DC
  Administered 2017-12-10 – 2017-12-11 (×3): 237 mL via ORAL

## 2017-12-09 NOTE — Progress Notes (Signed)
Occupational Therapy Session Note  Patient Details  Name: BRIE EPPARD MRN: [id number] Date of Birth: 12/21/1923  Today's Date: 12/09/2017 OT Individual Time: 1100-1120 OT Individual Time Calculation (min): 20 min    Short Term Goals: Week 1:  OT Short Term Goal 1 (Week 1): STGs=LTGs secondary to short estimated LOS  Skilled Therapeutic Interventions/Progress Updates:    1:1 Focus on d/c planning. Pt showered yesterday and reports may want to shower at home since now she has help. Performed tub shower transfer with tub bench with min guard and extra time. Discussed her shower doors would have to come off track in order to access. Discussed with son toward end of session. Recliner transfer with RW and functional ambulation in ADL apartment with min guard. Pass off to PT earlier due to fatigue for trial of Rollator.  Therapy Documentation Precautions:  Precautions Precautions: Fall Precaution Comments: Low vision at baseline; BPPV Required Braces or Orthoses: Other Brace/Splint Other Brace/Splint: soft collar for comfort Restrictions Weight Bearing Restrictions: No General: General PT Missed Treatment Reason: Patient fatigue passed off to PT - combined sessions due to fatigue Vital Signs:  Pain: C/o soreness in face where brusing is- applied ice at end of session  See Function Navigator for Current Functional Status.   Therapy/Group: Individual Therapy  Willeen Cass [facility] 12/09/2017, 2:11 PM

## 2017-12-09 NOTE — Progress Notes (Signed)
Occupational Therapy Session Note  Patient Details  Name: Sandy Owens MRN: [id number] Date of Birth: 02/24/1924  Today's Date: 12/09/2017 OT Individual Time: 1300-1358 OT Individual Time Calculation (min): 58 min    Short Term Goals: Week 1:  OT Short Term Goal 1 (Week 1): STGs=LTGs secondary to short estimated LOS  Skilled Therapeutic Interventions/Progress Updates:    Upon entering the room, pt seated in recliner chair with son present in room. Pt with no c/o pain but reports extreme fatigue from previous sessions. Pt is agreeable to OT intervention. OT educated pt and caregiver on energy conservation at discharge with self care and mobility. Caregiver asking questions as appropriate. Pt ambulating with RW and min A 60' and taking seated rest break. Pt then able to ambulate 30' more with steady assistance and RW before completely fatigued. OT assisted pt to the gym via wheelchair. Pt engaging in dynavision task while standing with outer two rings off secondary to cervical precautions. Pt standing for 2 minutes with close supervision for balance while hitting targets on board. Pt attempting a second time for 2 minutes while standing on foam wedge with intermittent steady assistance needed secondary to increased challenge. Pt taking seated rest break and assisted back to room via wheelchair. Pt remained in wheelchair with son present in room and all needs within reach.   Therapy Documentation Precautions:  Precautions Precautions: Fall Precaution Comments: Low vision at baseline; BPPV Required Braces or Orthoses: Other Brace/Splint Other Brace/Splint: soft collar for comfort Restrictions Weight Bearing Restrictions: No General: General PT Missed Treatment Reason: Patient fatigue Vital Signs: Therapy Vitals Temp: 98.6 F (37 C) Temp Source: Oral Pulse Rate: (!) 52 Resp: 16 BP: (!) 147/47 Patient Position (if appropriate): Lying Oxygen Therapy SpO2: 96 % O2 Device: Room  Air  See Function Navigator for Current Functional Status.   Therapy/Group: Individual Therapy  Gypsy Decant 12/09/2017, 3:24 PM

## 2017-12-09 NOTE — Progress Notes (Addendum)
Physical Therapy Note  Patient Details  Name: Sandy Owens MRN: [id number] Date of Birth: 1923/11/25 Today's Date: 12/09/2017    Time: 1120-1135 15 minutes  1:1 Pt c/o face hurting, ice applied after session.  Pt very fatigued but agreeable to trial gait with rollator.  Pt requires supervision for transfers and gait with rollator, min A and cuing for parts management and turning to sit with rollator. Pt states she feels more secure with RW vs rollator, pt's son present and agrees. Pt left in recliner with ice applied, needs at hand, son present.   Time: 1400-1425 25 minutes  1:1 pt with no c/o pain.  Pt requests nustep.  Pt performs gait without AD 20' x 2 to/from nustep.  nustep level 3 x 8 minutes for UE/LE strength and endurance.  Gait with RW 30' with supervision.  Pt supervision with bed mobility.  Pt left in bed with alarm set, needs at hand, family present.  Waseem Suess 12/09/2017, 11:37 AM

## 2017-12-09 NOTE — Progress Notes (Addendum)
Initial Nutrition Assessment  DOCUMENTATION CODES:   Not applicable  INTERVENTION:  Ensure Enlive po BID, each supplement provides 350 kcal and 20 grams of protein MVI with minerals - daily  NUTRITION DIAGNOSIS:   Increased nutrient needs related to other (see comment)(Therapy) as evidenced by estimated needs.  GOAL:   Patient will meet greater than or equal to 90% of their needs  MONITOR:   PO intake, Supplement acceptance, I & O's, Labs, Weight trends  REASON FOR ASSESSMENT:   Consult Poor PO  ASSESSMENT:   84 y.o. F admitted after fall with subsequent hemmorages and TBI. PMH of T2DM, CAD, hypertension, breast cancer, neuromuscular disorder, hypothyroidism. PSH of CABG.   Spoke to pt with family at bedside. Pt reports she has had a decreased appetite for a while, but she forces herself to eat. Her daughter reports that this has been going on for two years and that her portion sizes have gotten slightly smaller "she was eating a lot before and now she is probably eating normal portions"; one slice of pizza instead of three, a half of a sandwich instead of a full sandwich. In the hospital her son reports that she is eating well and has eaten at least two thirds of her lunch. Pt and daughter report no weight loss and that pt has been stable since 2014; this is consistent with weights in chart.   Medications reviewed: neurontin, robitussin, amaryl, novolog 0-9 units, levothroxine, sodium chloride.  Labs reviewed: Na 126 (L), Cl 92 (L), GFR 49 (L), BG 71 (WNL).    Intake/Output Summary (Last 24 hours) at 12/09/2017 1709 Last data filed at 12/09/2017 1200 Gross per 24 hour  Intake 340 ml  Output -  Net 340 ml    No results found for: HGBA1C   NUTRITION - FOCUSED PHYSICAL EXAM:  Couldn't complete full NFPE due to pt pain from fall.     Most Recent Value  Orbital Region  No depletion  Upper Arm Region  No depletion  Thoracic and Lumbar Region  Unable to assess  Buccal  Region  Unable to assess  Temple Region  Mild depletion  Clavicle Bone Region  No depletion  Clavicle and Acromion Bone Region  No depletion  Scapular Bone Region  Unable to assess  Dorsal Hand  No depletion  Patellar Region  No depletion  Anterior Thigh Region  No depletion  Posterior Calf Region  Mild depletion  Edema (RD Assessment)  None  Hair  Reviewed  Eyes  Reviewed  Mouth  Reviewed  Skin  Reviewed  Nails  Reviewed       Diet Order:  DIET DYS 2 Room service appropriate? Yes; Fluid consistency: Thin; Fluid restriction: 1200 mL Fluid  EDUCATION NEEDS:   Education needs have been addressed  Skin:  Skin Assessment: Skin Integrity Issues: Skin Integrity Issues:: Other (Comment), Incisions Incisions: Lip Other: Ecchymosis, face, chest, neck, R/L arms  Last BM:  12/08/17  Height:   Ht Readings from Last 1 Encounters:  12/03/17 5' (1.524 m)    Weight:   Wt Readings from Last 1 Encounters:  12/03/17 137 lb 5.6 oz (62.3 kg)   UBW: 136-138 lbs % UBW: 100%   Ideal Body Weight:  45.45 kg  BMI:  Body mass index is 26.82 kg/m.  Estimated Nutritional Needs:   Kcal:  1400-1600 kcal  Protein:  75-85 grams  Fluid:  1.2 L    Hope Budds, Dietetic Intern

## 2017-12-09 NOTE — Progress Notes (Signed)
[facility]     PROGRESS NOTE    Subjective/Complaints: Eating breakfast. States she doesn't have much appetite. "haven't for some time, actually"  ROS: Patient denies fever, rash, sore throat, blurred vision, nausea, vomiting, diarrhea, cough, shortness of breath or chest pain, joint or back pain, headache, or mood change.    Objective: Vital Signs: Blood pressure (!) 140/50, pulse 61, temperature 97.8 F (36.6 C), temperature source Oral, resp. rate 18, height 5' (1.524 m), weight 62.3 kg (137 lb 5.6 oz), SpO2 98 %. No results found. Recent Labs    12/08/17 0526  WBC 12.6*  HGB 10.3*  HCT 32.0*  PLT 360   Recent Labs    12/08/17 0526 12/09/17 0619  NA 128* 126*  K 4.2 4.1  CL 92* 92*  GLUCOSE 104* 71  BUN 16 16  CREATININE 0.97 0.97  CALCIUM 8.3* 8.4*   CBG (last 3)  Recent Labs    12/08/17 2040 12/09/17 0620 12/09/17 0646  GLUCAP 129* 68 89    Wt Readings from Last 3 Encounters:  12/03/17 62.3 kg (137 lb 5.6 oz)  11/27/17 59.8 kg (131 lb 13.4 oz)  12/31/15 62.1 kg (137 lb)    Physical Exam:  Constitutional: No distress . Vital signs reviewed. HEENT: EOMI, oral membranes moist Cardiovascular: RRR without murmur. No JVD    Respiratory: CTA Bilaterally without wheezes or rales. Normal effort    GI: BS +, non-tender, non-distended   Musculoskeletal:   feet non-tender Neurological:  Alert, oriented x 3.  Motor: RUE: 4+/5 proximal to distal LUE: 4/5 proximal to distal LLE: 4--4/5 proximal to distal RLE: 4--4/5 proximal to distal--motor exam unchanged from yesterday Skin:  Skin.    Improving abrasions on legs/arms  Psychiatric: pleasant    Assessment/Plan: 1. Functional deficits secondary to traumatic SAH which require 3+ hours per day of interdisciplinary therapy in a comprehensive inpatient rehab setting. Physiatrist is providing close team supervision and 24 hour management of active medical problems listed  below. Physiatrist and rehab team continue to assess barriers to discharge/monitor patient progress toward functional and medical goals.  Function:  Bathing Bathing position   Position: Shower  Bathing parts Body parts bathed by patient: Right arm, Left arm, Chest, Abdomen, Front perineal area, Buttocks, Right upper leg, Left upper leg, Right lower leg, Left lower leg Body parts bathed by helper: Back  Bathing assist Assist Level: Touching or steadying assistance(Pt > 75%)      Upper Body Dressing/Undressing Upper body dressing   What is the patient wearing?: Pull over shirt/dress     Pull over shirt/dress - Perfomed by patient: Thread/unthread right sleeve, Thread/unthread left sleeve, Put head through opening, Pull shirt over trunk Pull over shirt/dress - Perfomed by helper: Pull shirt over trunk        Upper body assist Assist Level: Supervision or verbal cues      Lower Body Dressing/Undressing Lower body dressing   What is the patient wearing?: Pants, Non-skid slipper socks, Underwear Underwear - Performed by patient: Thread/unthread right underwear leg, Thread/unthread left underwear leg, Pull underwear up/down   Pants- Performed by patient: Thread/unthread right pants leg, Thread/unthread left pants leg, Pull pants up/down Pants- Performed by helper: Pull pants up/down Non-skid slipper socks- Performed by patient: Don/doff right sock, Don/doff left sock Non-skid slipper socks- Performed by helper: Don/doff right sock, Don/doff left sock               TED Hose - Performed by  helper: Don/doff right TED hose, Don/doff left TED hose  Lower body assist Assist for lower body dressing: Supervision or verbal cues, Assistive device Assistive Device Comment: footstool    Toileting Toileting   Toileting steps completed by patient: Adjust clothing prior to toileting, Performs perineal hygiene, Adjust clothing after toileting Toileting steps completed by helper: Performs  perineal hygiene Toileting Assistive Devices: Grab bar or rail  Toileting assist Assist level: Supervision or verbal cues   Transfers Chair/bed transfer   Chair/bed transfer method: Stand pivot Chair/bed transfer assist level: Touching or steadying assistance (Pt > 75%) Chair/bed transfer assistive device: Medical sales representative     Max distance: 200' Assist level: Touching or steadying assistance (Pt > 75%)   Wheelchair          Cognition Comprehension Comprehension assist level: Follows basic conversation/direction with extra time/assistive device  Expression Expression assist level: Expresses basic needs/ideas: With extra time/assistive device  Social Interaction Social Interaction assist level: Interacts appropriately with others with medication or extra time (anti-anxiety, antidepressant).  Problem Solving Problem solving assist level: Solves basic 90% of the time/requires cueing < 10% of the time  Memory Memory assist level: Recognizes or recalls 50 - 74% of the time/requires cueing 25 - 49% of the time   Medical Problem List and Plan: 1.  Decreased functional mobility secondary to [facility] as well as multiple facial fractures, nasal septum fracture with facial contusions.  Acute nondisplaced C4 and C5 spinous process fractures-soft cervical collar for comfort.  Multiple tooth extractions after fall 11/26/2017   -Continue PT, OT, ST 2.  DVT Prophylaxis/Anticoagulation: Subcutaneous Lovenox initiated 12/01/2017.    -vascular study was negative on 4/4 3. Pain Management: Neurontin 100 mg twice daily, Anaprox 275 mg twice daily, hydrocodone/Ultram as needed 4. Mood: Provide emotional support 5. Neuropsych: This patient is capable of making decisions on her own behalf. 6. Skin/Wound Care: Routine skin checks 7. Fluids/Electrolytes/Nutrition:  .   -Continue to encourage intake.     -Hyponatremia: suspect central cause. Sodium down from 128 to 126 today    -adjust Fluid  restriction to 1200 cc    -salt tabs    -check urine sodium    -am bmet/serum osmolality 8.  Acute blood loss anemia.    Hemoglobin 10.3 today 4/8   -Continue to monitor 9.  Urinary retention.  Urecholine 5 mg 3 times daily.     -Appears to be voiding regularly.  Will stop Urecholine and observe 10.  Hypertension.  Norvasc 5 mg daily, Tenormin 50 mg twice daily, lisinopril 5 mg daily.  Monitor with increased mobility 11.  History of gout.  Colchicine 0.6 mg daily 12.  History of CAD with CABG 2001. Continue to monitor 13.  History of left breast cancer with mastectomy 14.  Hypothyroidism.  Synthroid 15.  Diabetes mellitus.  follow CBG's. Continue SSI  - Patient on Amaryl 1 mg daily prior to admission  -  Amaryl 1 mg resumed 16. Shortness of breath:   -generally improved         LOS (Days) 6 A FACE TO FACE EVALUATION WAS PERFORMED  Meredith Staggers, MD 12/09/2017 8:26 AM

## 2017-12-09 NOTE — Plan of Care (Signed)
  Problem: RH BOWEL ELIMINATION Goal: RH STG MANAGE BOWEL WITH ASSISTANCE Description STG Manage Bowel with  Mod  I Assistance.  Outcome: Progressing   Problem: RH BLADDER ELIMINATION Goal: RH STG MANAGE BLADDER WITH MEDICATION WITH ASSISTANCE Description STG Manage Bladder With Medication With  Mod I Assistance.  Outcome: Progressing   Problem: RH SAFETY Goal: RH STG ADHERE TO SAFETY PRECAUTIONS W/ASSISTANCE/DEVICE Description STG Adhere to Safety Precautions With supervision/cues Assistance/Device.  Outcome: Progressing   Problem: RH KNOWLEDGE DEFICIT GENERAL Goal: RH STG INCREASE KNOWLEDGE OF SELF CARE AFTER HOSPITALIZATION Description Family will be able to manage care of patient at discharge using cues/resources independently  Outcome: Progressing

## 2017-12-09 NOTE — Progress Notes (Signed)
Speech Language Pathology Daily Session Note  Patient Details  Name: Sandy Owens MRN: [id number] Date of Birth: 26-Nov-1923  Today's Date: 12/09/2017 SLP Individual Time: 1000-1055 SLP Individual Time Calculation (min): 55 min  Short Term Goals: Week 1: SLP Short Term Goal 1 (Week 1): STG=LTG due to ELOS   Skilled Therapeutic Interventions: Skilled treatment session focused on cognitive goals. Upon arrival, pt was supine in bed. SLP facilitated session by providing Supervision verbal cues for problem solving in regards to transfer from bed to wheelchair. Pt also transferred to bathroom and able to successfully void. SLP further facilitated session by providing Min-Mod A verbal cues for problem solving during a moderately complex task (Call-It). Pt required Min A verbal cues to recall new information during the task. Pt also demonstrated selective attention for ~45 minutes in a mildly distracting environment with Mod I. Pt left upright in wheelchair with son present. Continue with current plan of care.      Function:  Cognition Comprehension Comprehension assist level: Follows basic conversation/direction with extra time/assistive device  Expression   Expression assist level: Expresses basic needs/ideas: With extra time/assistive device  Social Interaction Social Interaction assist level: Interacts appropriately with others with medication or extra time (anti-anxiety, antidepressant).  Problem Solving Problem solving assist level: Solves basic 90% of the time/requires cueing < 10% of the time  Memory Memory assist level: Recognizes or recalls 75 - 89% of the time/requires cueing 10 - 24% of the time    Pain No/denies pain  Therapy/Group: Individual Therapy  Meredeth Ide  SLP - Student 12/09/2017, 3:02 PM

## 2017-12-10 ENCOUNTER — Inpatient Hospital Stay (HOSPITAL_COMMUNITY): Payer: Medicare HMO | Admitting: Occupational Therapy

## 2017-12-10 ENCOUNTER — Inpatient Hospital Stay (HOSPITAL_COMMUNITY): Payer: Medicare HMO | Admitting: Physical Therapy

## 2017-12-10 ENCOUNTER — Inpatient Hospital Stay (HOSPITAL_COMMUNITY): Payer: Medicare HMO

## 2017-12-10 LAB — BASIC METABOLIC PANEL
Anion gap: 10 (ref 5–15)
BUN: 14 mg/dL (ref 6–20)
CO2: 26 mmol/L (ref 22–32)
Calcium: 8.8 mg/dL — ABNORMAL LOW (ref 8.9–10.3)
Chloride: 89 mmol/L — ABNORMAL LOW (ref 101–111)
Creatinine, Ser: 1.01 mg/dL — ABNORMAL HIGH (ref 0.44–1.00)
GFR calc Af Amer: 54 mL/min — ABNORMAL LOW (ref >60)
GFR calc non Af Amer: 47 mL/min — ABNORMAL LOW (ref >60)
Glucose, Bld: 100 mg/dL — ABNORMAL HIGH (ref 65–99)
Potassium: 4.6 mmol/L (ref 3.5–5.1)
Sodium: 125 mmol/L — ABNORMAL LOW (ref 135–145)

## 2017-12-10 LAB — GLUCOSE, CAPILLARY
Glucose-Capillary: 101 mg/dL — ABNORMAL HIGH (ref 65–99)
Glucose-Capillary: 166 mg/dL — ABNORMAL HIGH (ref 65–99)
Glucose-Capillary: 87 mg/dL (ref 65–99)
Glucose-Capillary: 110 mg/dL — ABNORMAL HIGH (ref 65–99)

## 2017-12-10 LAB — OSMOLALITY: Osmolality: 265 mOsm/kg — ABNORMAL LOW (ref 275–295)

## 2017-12-10 MED ORDER — DEMECLOCYCLINE HCL 150 MG PO TABS
150.0000 mg | ORAL_TABLET | Freq: Two times a day (BID) | ORAL | Status: DC
  Administered 2017-12-10 – 2017-12-12 (×5): 150 mg via ORAL
  Filled 2017-12-10 (×5): qty 1

## 2017-12-10 NOTE — Progress Notes (Signed)
Physical Therapy Note  Patient Details  Name: Sandy Owens MRN: [id number] Date of Birth: 1923/10/08 Today's Date: 12/10/2017    Time: [id number] 45 minutes  1:1 Pt with no c/o pain, states her face feels "ok".  Pt performs gait in/out of bathroom with RW and supervision, toileting and toilet transfers with supervision.  Standing balance for hand washing with supervision.  Stair negotiation with 1 handrail on Rt to simulate home entry.  Pt requires min/mod A for lifting and eccentric control during ascending and descending stairs. Pt with mm twitches/spasms during stair climbing, new symptom, RN made aware. Pt performs 4 stairs x 2.  Gait with RW 25', 52' with pt continuing with mm spasms, close supervision for gait.  Pt reports she is fatigued and unable to complete session. Pt left in room with needs at hand, son present.  Missed 15 minutes skilled PT.  Livingston Denner 12/10/2017, 10:15 AM

## 2017-12-10 NOTE — Plan of Care (Signed)
  Problem: Consults Goal: RH GENERAL PATIENT EDUCATION Description See Patient Education module for education specifics. Outcome: Progressing   Problem: RH BOWEL ELIMINATION Goal: RH STG MANAGE BOWEL WITH ASSISTANCE Description STG Manage Bowel with  Mod  I Assistance.  Outcome: Progressing Goal: RH STG MANAGE BOWEL W/MEDICATION W/ASSISTANCE Description STG Manage Bowel with Medication with mod I Assistance.  Outcome: Progressing   Problem: RH BLADDER ELIMINATION Goal: RH STG MANAGE BLADDER WITH MEDICATION WITH ASSISTANCE Description STG Manage Bladder With Medication With  Mod I Assistance.  Outcome: Progressing   Problem: RH SAFETY Goal: RH STG ADHERE TO SAFETY PRECAUTIONS W/ASSISTANCE/DEVICE Description STG Adhere to Safety Precautions With supervision/cues Assistance/Device.  Outcome: Progressing   Problem: RH KNOWLEDGE DEFICIT GENERAL Goal: RH STG INCREASE KNOWLEDGE OF SELF CARE AFTER HOSPITALIZATION Description Family will be able to manage care of patient at discharge using cues/resources independently  Outcome: Progressing

## 2017-12-10 NOTE — Progress Notes (Signed)
[facility] MEDICINE & REHABILITATION     PROGRESS NOTE    Subjective/Complaints: Up in bed. Trying to eat. No new issues. Ongoing right facial pain, but stable  ROS: Patient denies fever, rash, sore throat, blurred vision, nausea, vomiting, diarrhea, cough, shortness of breath or chest pain, joint or back pain, headache, or mood change. .    Objective: Vital Signs: Blood pressure (!) 161/58, pulse (!) 52, temperature 97.9 F (36.6 C), temperature source Oral, resp. rate 18, height 5' (1.524 m), weight 62.1 kg (136 lb 14.5 oz), SpO2 97 %. No results found. Recent Labs    12/08/17 0526  WBC 12.6*  HGB 10.3*  HCT 32.0*  PLT 360   Recent Labs    12/09/17 0619 12/10/17 0629  NA 126* 125*  K 4.1 4.6  CL 92* 89*  GLUCOSE 71 100*  BUN 16 14  CREATININE 0.97 1.01*  CALCIUM 8.4* 8.8*   CBG (last 3)  Recent Labs    12/09/17 1659 12/09/17 2055 12/10/17 0620  GLUCAP 104* 124* 101*    Wt Readings from Last 3 Encounters:  12/10/17 62.1 kg (136 lb 14.5 oz)  11/27/17 59.8 kg (131 lb 13.4 oz)  12/31/15 62.1 kg (137 lb)    Physical Exam:  Constitutional: No distress . Vital signs reviewed. HEENT: EOMI, oral membranes moist, hematoma right maxilla Cardiovascular: RRR without murmur. No JVD    Respiratory: CTA Bilaterally without wheezes or rales. Normal effort    GI: BS +, non-tender, non-distended   Musculoskeletal:   feet non-tender Neurological:  Alert, oriented x 3. Reasonable insight and awareness Motor: RUE: 4+/5 proximal to distal LUE: 4/5 proximal to distal LLE: 4--4/5 proximal to distal RLE: 4--4/5 proximal to distal--stable exam Skin:  Skin.    Improving abrasions on legs/arms  Psychiatric: pleasant    Assessment/Plan: 1. Functional deficits secondary to traumatic SAH which require 3+ hours per day of interdisciplinary therapy in a comprehensive inpatient rehab setting. Physiatrist is providing close team supervision and 24 hour management of  active medical problems listed below. Physiatrist and rehab team continue to assess barriers to discharge/monitor patient progress toward functional and medical goals.  Function:  Bathing Bathing position   Position: Shower  Bathing parts Body parts bathed by patient: Right arm, Left arm, Chest, Abdomen, Front perineal area, Buttocks, Right upper leg, Left upper leg, Right lower leg, Left lower leg Body parts bathed by helper: Back  Bathing assist Assist Level: Touching or steadying assistance(Pt > 75%)      Upper Body Dressing/Undressing Upper body dressing   What is the patient wearing?: Pull over shirt/dress     Pull over shirt/dress - Perfomed by patient: Thread/unthread right sleeve, Thread/unthread left sleeve, Put head through opening, Pull shirt over trunk Pull over shirt/dress - Perfomed by helper: Pull shirt over trunk        Upper body assist Assist Level: Supervision or verbal cues      Lower Body Dressing/Undressing Lower body dressing   What is the patient wearing?: Pants, Non-skid slipper socks, Underwear Underwear - Performed by patient: Thread/unthread right underwear leg, Thread/unthread left underwear leg, Pull underwear up/down   Pants- Performed by patient: Thread/unthread right pants leg, Thread/unthread left pants leg, Pull pants up/down Pants- Performed by helper: Pull pants up/down Non-skid slipper socks- Performed by patient: Don/doff right sock, Don/doff left sock Non-skid slipper socks- Performed by helper: Don/doff right sock, Don/doff left sock  TED Hose - Performed by helper: Don/doff right TED hose, Don/doff left TED hose  Lower body assist Assist for lower body dressing: Supervision or verbal cues, Assistive device Assistive Device Comment: footstool    Toileting Toileting   Toileting steps completed by patient: Adjust clothing prior to toileting, Performs perineal hygiene, Adjust clothing after toileting Toileting steps  completed by helper: Performs perineal hygiene Toileting Assistive Devices: Grab bar or rail  Toileting assist Assist level: Supervision or verbal cues   Transfers Chair/bed transfer   Chair/bed transfer method: Ambulatory Chair/bed transfer assist level: Supervision or verbal cues Chair/bed transfer assistive device: Medical sales representative     Max distance: 12' Assist level: Touching or steadying assistance (Pt > 75%)   Wheelchair          Cognition Comprehension Comprehension assist level: Follows basic conversation/direction with extra time/assistive device  Expression Expression assist level: Expresses basic needs/ideas: With extra time/assistive device  Social Interaction Social Interaction assist level: Interacts appropriately with others with medication or extra time (anti-anxiety, antidepressant).  Problem Solving Problem solving assist level: Solves basic 90% of the time/requires cueing < 10% of the time  Memory Memory assist level: Recognizes or recalls 75 - 89% of the time/requires cueing 10 - 24% of the time   Medical Problem List and Plan: 1.  Decreased functional mobility secondary to [facility] as well as multiple facial fractures, nasal septum fracture with facial contusions.  Acute nondisplaced C4 and C5 spinous process fractures-soft cervical collar for comfort.  Multiple tooth extractions after fall 11/26/2017   -Continue PT, OT, ST 2.  DVT Prophylaxis/Anticoagulation: Subcutaneous Lovenox initiated 12/01/2017.    -vascular study was negative on 4/4 3. Pain Management: Neurontin 100 mg twice daily, Anaprox 275 mg twice daily, hydrocodone/Ultram as needed 4. Mood: Provide emotional support 5. Neuropsych: This patient is capable of making decisions on her own behalf. 6. Skin/Wound Care: Routine skin checks 7. Fluids/Electrolytes/Nutrition:  .   -Continue to encourage intake.     -SIADH: Sodium with further decrease to 125 today. Hypo-osmolar     -adjusted  Fluid restriction to 1200 cc on 4/9    -salt tabs    -urine sodium elevated    - daily bmet   -begin declomycin 150mg  bid    -nutrition consult appreciated 8.  Acute blood loss anemia.    Hemoglobin 10.3 today 4/8   -Continue to monitor 9.  Urinary retention.  resolved     -  10.  Hypertension.  Norvasc 5 mg daily, Tenormin 50 mg twice daily, lisinopril 5 mg daily.  Monitor with increased mobility 11.  History of gout.  Colchicine 0.6 mg daily 12.  History of CAD with CABG 2001. Continue to monitor 13.  History of left breast cancer with mastectomy 14.  Hypothyroidism.  Synthroid 15.  Diabetes mellitus. Hypoglycemic yesterday  - Patient on Amaryl 1 mg daily prior to admission  -  Amaryl 1 mg ---hold 16. Shortness of breath:   -generally improved         LOS (Days) 7 A FACE TO FACE EVALUATION WAS PERFORMED  Meredith Staggers, MD 12/10/2017 8:29 AM

## 2017-12-10 NOTE — Progress Notes (Signed)
Occupational Therapy Session Note  Patient Details  Name: Sandy Owens MRN: [id number] Date of Birth: [birth year]  Today's Date: 12/10/2017 OT Individual Time: 1300-1403 OT Individual Time Calculation (min): 63 min  and Today's Date: 12/10/2017 OT Missed Time: 12 Minutes Missed Time Reason: Patient fatigue   Short Term Goals: Week 1:  OT Short Term Goal 1 (Week 1): STGs=LTGs secondary to short estimated LOS  Skilled Therapeutic Interventions/Progress Updates:    Upon entering the room, pt supine in bed and heavily fatigued. She was agreeable to attempt shower this session. Pt ambulating with close supervision and RW into bathroom for toileting. Pt able to void bladder but unable to have BM. Pt doffing clothing while seated on toilet for safety. Pt transferred onto TTB with steady assistance and bathing while seated for safety. Lateral leans to wash buttocks and use of LH sponge to wash B LEs. Pt exiting the shower and ambulating back to bed. Pt so fatigued at this point she returned to supine before dressing to rest. OT gathered clothing items during this time and pt dressing from EOB with increased time. Pt returning to bed at end of session and missing 12 minutes secondary to fatigue. Bed alarm activated and call bell within reach upon exiting the room.   Therapy Documentation Precautions:  Precautions Precautions: Fall Precaution Comments: Low vision at baseline; BPPV Required Braces or Orthoses: Other Brace/Splint Other Brace/Splint: soft collar for comfort Restrictions Weight Bearing Restrictions: No General: General OT Amount of Missed Time: 12 Minutes PT Missed Treatment Reason: Patient fatigue Vital Signs: Therapy Vitals Temp: 98.4 F (36.9 C) Temp Source: Oral Pulse Rate: (!) 56 Resp: 17 BP: (!) 142/49 Patient Position (if appropriate): Lying Oxygen Therapy SpO2: 98 % O2 Device: Room Air Pain: Pain Assessment Pain Score: 0-No pain ADL:   Vision    Perception    Praxis   Exercises:   Other Treatments:    See Function Navigator for Current Functional Status.   Therapy/Group: Individual Therapy  Gypsy Decant 12/10/2017, 2:13 PM

## 2017-12-10 NOTE — Progress Notes (Addendum)
Speech Language Pathology Daily Session Note  Patient Details  Name: Sandy Owens MRN: [id number] Date of Birth: 1924/08/24  Today's Date: 12/10/2017 SLP Individual Time: 1007-1219 SLP Individual Time Calculation (min): 27 min  Short Term Goals: Week 1: SLP Short Term Goal 1 (Week 1): STG=LTG due to ELOS   Skilled Therapeutic Interventions:Skilled ST services focused on cognitive skills. SLP facilitated recall skills in spaced retrieval tasks of 3 step directions with 30 seconds- 1 minute delay in mildly distracting environment, pt required supervision A verbal/question cues. SLP provided initial instructon in recall strategies, visualization, chunking and verbal rehearsal, pior to recall tasks.SLP facilitated anticipatory awareness skills given [place] Q of safety scenarios at home, pt demonstrated Mod I/ Pt demonstrated selective attention in mildly distracting environment with NT in/out of room at Mod I. Pt was left in room with call bell within reach. Reccomend to continue skilled ST services.      Function:  Eating Eating   Modified Consistency Diet: Yes Eating Assist Level: More than reasonable amount of time;Set up assist for   Eating Set Up Assist For: Opening containers       Cognition Comprehension Comprehension assist level: Follows basic conversation/direction with extra time/assistive device  Expression   Expression assist level: Expresses basic needs/ideas: With extra time/assistive device  Social Interaction Social Interaction assist level: Interacts appropriately with others with medication or extra time (anti-anxiety, antidepressant).  Problem Solving Problem solving assist level: Solves basic 90% of the time/requires cueing < 10% of the time;Solves basic problems with no assist  Memory Memory assist level: Recognizes or recalls 90% of the time/requires cueing < 10% of the time    Pain Pain Assessment Pain Scale: 0-10 Pain Score: 0-No pain  Therapy/Group:  Individual Therapy  Doyal Saric  [facility] 12/10/2017, 1:54 PM

## 2017-12-10 NOTE — Progress Notes (Signed)
Physical Therapy Session Note  Patient Details  Name: Sandy Owens MRN: [id number] Date of Birth: 06-Dec-1923  Today's Date: 12/10/2017 PT Individual Time: 1545-1630 PT Individual Time Calculation (min): 45 min   Short Term Goals: Week 1:  PT Short Term Goal 1 (Week 1): =LTG due to ELOS  Skilled Therapeutic Interventions/Progress Updates:    no c/o pain at rest, but reports pain in bottoms of feet with ambulation.    Pt transitions to EOB with supervision and increased time.  Sit<>stand throughout session with RW and min verbal cues for hand placement.  Gait training x30' +75' with RW and assist only to maintain walker in straight path and verbal cues for increased step length.  Pt completes 2x2 minutes on UEB (forwards/backwards) with cues for pacing.  Returned to room in w/c at end of session, transferred back to bed with stand/pivot using bed rail with supervision.  Positioned to comfort with call bell in reach and needs met.   Therapy Documentation Precautions:  Precautions Precautions: Fall Precaution Comments: Low vision at baseline; BPPV Required Braces or Orthoses: Other Brace/Splint Other Brace/Splint: soft collar for comfort Restrictions Weight Bearing Restrictions: No   See Function Navigator for Current Functional Status.   Therapy/Group: Individual Therapy  Michel Santee 12/10/2017, 6:46 PM

## 2017-12-11 ENCOUNTER — Inpatient Hospital Stay (HOSPITAL_COMMUNITY): Payer: Medicare HMO | Admitting: Occupational Therapy

## 2017-12-11 ENCOUNTER — Inpatient Hospital Stay (HOSPITAL_COMMUNITY): Payer: Medicare HMO | Admitting: Physical Therapy

## 2017-12-11 ENCOUNTER — Inpatient Hospital Stay (HOSPITAL_COMMUNITY): Payer: Medicare HMO | Admitting: Speech Pathology

## 2017-12-11 LAB — GLUCOSE, CAPILLARY
Glucose-Capillary: 77 mg/dL (ref 65–99)
Glucose-Capillary: 184 mg/dL — ABNORMAL HIGH (ref 65–99)
Glucose-Capillary: 66 mg/dL (ref 65–99)
Glucose-Capillary: 99 mg/dL (ref 65–99)
Glucose-Capillary: 141 mg/dL — ABNORMAL HIGH (ref 65–99)

## 2017-12-11 LAB — BASIC METABOLIC PANEL
Anion gap: 10 (ref 5–15)
BUN: 18 mg/dL (ref 6–20)
CO2: 24 mmol/L (ref 22–32)
Calcium: 8.6 mg/dL — ABNORMAL LOW (ref 8.9–10.3)
Chloride: 92 mmol/L — ABNORMAL LOW (ref 101–111)
Creatinine, Ser: 1.02 mg/dL — ABNORMAL HIGH (ref 0.44–1.00)
GFR calc Af Amer: 54 mL/min — ABNORMAL LOW (ref >60)
GFR calc non Af Amer: 46 mL/min — ABNORMAL LOW (ref >60)
Glucose, Bld: 86 mg/dL (ref 65–99)
Potassium: 4.5 mmol/L (ref 3.5–5.1)
Sodium: 126 mmol/L — ABNORMAL LOW (ref 135–145)

## 2017-12-11 MED ORDER — NAPROXEN SODIUM 275 MG PO TABS
275.0000 mg | ORAL_TABLET | Freq: Two times a day (BID) | ORAL | Status: DC | PRN
  Filled 2017-12-11: qty 1

## 2017-12-11 NOTE — Patient Care Conference (Signed)
Inpatient RehabilitationTeam Conference and Plan of Care Update Date: 12/09/2017   Time: 2:40 PM    Patient Name: Sandy Owens [facility]      Medical Record Number: [id number]  Date of Birth: 1924-08-07 Sex: Female         Room/Bed: 4W02C/4W02C-01 Payor Info: Payor: [employer] MEDICARE / Plan: [place] HMO / Product Type: *No Product type* /    Admitting Diagnosis: Trauma Fall, Facial Trauma  Admit Date/Time:  12/03/2017  5:51 PM Admission Comments: No comment available   Primary Diagnosis:  Focal traumatic brain injury with LOC of 30 minutes or less, sequela ([place]) Principal Problem: Focal traumatic brain injury with LOC of 30 minutes or less, sequela ([place])  Patient Active Problem List   Diagnosis Date Noted  . Focal traumatic brain injury with LOC of 30 minutes or less, sequela ([place]) 12/03/2017  . Hypothyroidism   . History of gout   . Urinary retention   . Fall   . Hypoxia   . Lip laceration   . Pain   . Subarachnoid hemorrhage ([place])   . History of breast cancer   . Coronary artery disease involving coronary bypass graft of native heart without angina pectoris   . Benign essential HTN   . Type 2 diabetes mellitus with peripheral neuropathy (HCC)   . Acute idiopathic gout of right foot   . Oropharyngeal dysphagia   . Leukocytosis   . Acute blood loss anemia   . Stage 3 chronic kidney disease ([place])   . Multiple trauma 11/26/2017  . Breast cancer ([place]) 06/06/2014  . Cancer of breast, female ([place]) 04/13/2014    Expected Discharge Date: Expected Discharge Date: 12/13/17  Team Members Present: Physician leading conference: Dr. Alger Owens Social Worker Present: Sandy Pall, LCSW Nurse Present: Sandy Gale, RN PT Present: Sandy Owens, PT OT Present: Sandy Owens, OT SLP Present: Sandy Owens, SLP PPS Coordinator present : Sandy Nakayama, RN, CRRN     Current Status/Progress Goal Weekly Team Focus  Medical   Status post brain injury with facial trauma.  Now with  hyponatremia as well  Improve medical stability to allow participation with therapies  Stabilize sodium levels, improve nutritional intake   Bowel/Bladder   Continent of B/B  Maintain continence  Assist with toileting as needed   Swallow/Nutrition/ Hydration             ADL's   Supervision bathing + dressing, Supervision-Min A bathroom transfers using RW at ambulatory level  Downgraded to supervision overall   Pt/family education, balance, functional transfers, endurance, walker safety    Mobility   supervision gait with RW, min A without RW  supervision overall  pt/family ed, d/c planning   Communication             Safety/Cognition/ Behavioral Observations  Min A  Supervision-Mod I  recall and complex problem solving    Pain   Denies Pain  Pain < 2  Assess Qshift and PRN   Skin   Bruising to face and upper body  Prevent infection/breakdown  Assess Qshift and PRN    Rehab Goals Patient on target to meet rehab goals: Yes *See Care Plan and progress notes for long and short-term goals.     Barriers to Discharge  Current Status/Progress Possible Resolutions Date Resolved   Physician    Medical stability        Continue to manage medical issues as above      Nursing  PT                    OT                  SLP                SW                Discharge Planning/Teaching Needs:  Home with daughter to provide 24/7 initially and taper as they feel appropriate.      Team Discussion:  BI with facial  fxs;  Watching sodium.  Poor po and poor appetite.  Reaching supervision goals but still easily fatigued.  Good cognition; some decreased recall and mild memory deficits.   Revisions to Treatment Plan:  None    Continued Need for Acute Rehabilitation Level of Care: The patient requires daily medical management by a physician with specialized training in physical medicine and rehabilitation for the following conditions: Daily direction of a  multidisciplinary physical rehabilitation program to ensure safe treatment while eliciting the highest outcome that is of practical value to the patient.: Yes Daily medical management of patient stability for increased activity during participation in an intensive rehabilitation regime.: Yes Daily analysis of laboratory values and/or radiology reports with any subsequent need for medication adjustment of medical intervention for : Neurological problems;Nutritional problems  Sandy Owens 12/11/2017, 4:08 PM

## 2017-12-11 NOTE — Progress Notes (Addendum)
[place] PHYSICAL MEDICINE & REHABILITATION     PROGRESS NOTE    Subjective/Complaints: Patient sitting in bed eating breakfast.  No new complaints today  ROS: Patient denies fever, rash, sore throat, blurred vision, nausea, vomiting, diarrhea, cough, shortness of breath or chest pain, joint or back pain, headache, or mood change.     Objective: Vital Signs: Blood pressure (!) 173/56, pulse (!) 55, temperature 98.3 F (36.8 C), temperature source Oral, resp. rate 18, height 5' (1.524 m), weight 62.1 kg (136 lb 14.5 oz), SpO2 96 %. No results found. No results for input(s): WBC, HGB, HCT, PLT in the last 72 hours. Recent Labs    12/09/17 0619 12/10/17 0629  NA 126* 125*  K 4.1 4.6  CL 92* 89*  GLUCOSE 71 100*  BUN 16 14  CREATININE 0.97 1.01*  CALCIUM 8.4* 8.8*   CBG (last 3)  Recent Labs    12/10/17 1627 12/10/17 2123 12/11/17 0603  GLUCAP 87 110* 77    Wt Readings from Last 3 Encounters:  12/10/17 62.1 kg (136 lb 14.5 oz)  11/27/17 59.8 kg (131 lb 13.4 oz)  12/31/15 62.1 kg (137 lb)    Physical Exam:  Constitutional: No distress . Vital signs reviewed. HEENT: EOMI, oral membranes moist.  Decreased swelling around right eye.  Eye more open today.   Cardiovascular: RRR without murmur. No JVD    Respiratory: CTA Bilaterally without wheezes or rales. Normal effort    GI: BS +, non-tender, non-distended   Musculoskeletal:   feet non-tender Neurological:  Alert, oriented x 3.  Improving insight and awareness Motor: RUE: 4+/5 proximal to distal LUE: 4/5 proximal to distal LLE: 4--4/5 proximal to distal RLE: 4--4/5 proximal to distal--unchanged Skin:  Skin.    Clean and dry with resolving abrasions Psychiatric: pleasant    Assessment/Plan: 1. Functional deficits secondary to traumatic SAH which require 3+ hours per day of interdisciplinary therapy in a comprehensive inpatient rehab setting. Physiatrist is providing close team supervision and 24 hour  management of active medical problems listed below. Physiatrist and rehab team continue to assess barriers to discharge/monitor patient progress toward functional and medical goals.  Function:  Bathing Bathing position   Position: Shower  Bathing parts Body parts bathed by patient: Right arm, Left arm, Chest, Abdomen, Front perineal area, Buttocks, Right upper leg, Left upper leg, Right lower leg, Left lower leg Body parts bathed by helper: Back  Bathing assist Assist Level: Touching or steadying assistance(Pt > 75%), Assistive device Assistive Device Comment: LH sponge    Upper Body Dressing/Undressing Upper body dressing   What is the patient wearing?: Pull over shirt/dress     Pull over shirt/dress - Perfomed by patient: Thread/unthread right sleeve, Thread/unthread left sleeve, Put head through opening, Pull shirt over trunk Pull over shirt/dress - Perfomed by helper: Pull shirt over trunk        Upper body assist Assist Level: Supervision or verbal cues      Lower Body Dressing/Undressing Lower body dressing   What is the patient wearing?: Pants, Non-skid slipper socks, Underwear Underwear - Performed by patient: Thread/unthread right underwear leg, Thread/unthread left underwear leg, Pull underwear up/down   Pants- Performed by patient: Thread/unthread right pants leg, Thread/unthread left pants leg, Pull pants up/down Pants- Performed by helper: Pull pants up/down Non-skid slipper socks- Performed by patient: Don/doff right sock, Don/doff left sock Non-skid slipper socks- Performed by helper: Don/doff right sock, Don/doff left sock  TED Hose - Performed by helper: Don/doff right TED hose, Don/doff left TED hose  Lower body assist Assist for lower body dressing: Supervision or verbal cues, Assistive device Assistive Device Comment: footstool    Toileting Toileting   Toileting steps completed by patient: Adjust clothing prior to toileting, Performs  perineal hygiene, Adjust clothing after toileting Toileting steps completed by helper: Performs perineal hygiene Toileting Assistive Devices: Grab bar or rail  Toileting assist Assist level: Supervision or verbal cues   Transfers Chair/bed transfer   Chair/bed transfer method: Ambulatory Chair/bed transfer assist level: Supervision or verbal cues Chair/bed transfer assistive device: Medical sales representative     Max distance: 56' Assist level: Touching or steadying assistance (Pt > 75%)   Wheelchair          Cognition Comprehension Comprehension assist level: Follows basic conversation/direction with extra time/assistive device  Expression Expression assist level: Expresses basic needs/ideas: With extra time/assistive device  Social Interaction Social Interaction assist level: Interacts appropriately with others with medication or extra time (anti-anxiety, antidepressant).  Problem Solving Problem solving assist level: Solves basic 90% of the time/requires cueing < 10% of the time, Solves basic problems with no assist  Memory Memory assist level: Recognizes or recalls 90% of the time/requires cueing < 10% of the time   Medical Problem List and Plan: 1.  Decreased functional mobility secondary to [facility] as well as multiple facial fractures, nasal septum fracture with facial contusions.  Acute nondisplaced C4 and C5 spinous process fractures-soft cervical collar for comfort.  Multiple tooth extractions after fall 11/26/2017   -Continue PT, OT, ST   -ELOS 4/13 pending medical stability  -Patient to see Rehab MD/provider in the office for transitional care encounter in 1-2 weeks.  2.  DVT Prophylaxis/Anticoagulation: Subcutaneous Lovenox initiated 12/01/2017.    -vascular study was negative on 4/4 3. Pain Management: Neurontin 100 mg twice daily, Anaprox 275 mg twice daily, hydrocodone/Ultram as needed 4. Mood: Provide emotional support 5. Neuropsych: This patient is capable of  making decisions on her own behalf. 6. Skin/Wound Care: Local care and skin checks 7. Fluids/Electrolytes/Nutrition:  .   -Continue to encourage intake.     -SIADH: BMET sl improved today to 126 (125)    -adjusted Fluid restriction to 1200 cc on 4/9    -salt tabs    -urine sodium elevated    - continue daily bmet   - continue declomycin 150mg  bid initiated 12/10/17    -nutrition consult appreciated 8.  Acute blood loss anemia.    Hemoglobin 10.3   4/8   -Continue to monitor 9.  Urinary retention.  resolved   10.  Hypertension.  Norvasc 5 mg daily, Tenormin 50 mg twice daily, lisinopril 5 mg daily.   -Fair control at present 11.  History of gout.  Colchicine 0.6 mg daily 12.  History of CAD with CABG 2001. Continue to monitor 13.  History of left breast cancer with mastectomy 14.  Hypothyroidism.  Synthroid 15.  Diabetes mellitus.   - Patient on Amaryl 1 mg daily prior to admission  -  Amaryl 1 mg initially resumed but then held due to hypoglycemia 16. Shortness of breath:   -generally improved         LOS (Days) 8 A FACE TO FACE EVALUATION WAS PERFORMED  Meredith Staggers, MD 12/11/2017 8:20 AM

## 2017-12-11 NOTE — Progress Notes (Signed)
Physical Therapy Session Note  Patient Details  Name: Sandy Owens MRN: [id number] Date of Birth: 1923-09-15  Today's Date: 12/11/2017 PT Individual Time: 1115-1200 PT Individual Time Calculation (min): 45 min   Short Term Goals: Week 1:  PT Short Term Goal 1 (Week 1): =LTG due to ELOS  Skilled Therapeutic Interventions/Progress Updates:    Pt finishing up toileting with nursing, agreeable to participate in therapy session. Pt reports some chronic L shoulder soreness, not rated. Pt is able to complete 3/3 toileting steps with Supervision and grab bar and RW for balance. Attempt to have pt perform shoulder rolls for upper back and shoulder tightness, pt reports increase in L shoulder soreness, exercise deferred. Ambulation 2 x 100 ft with RW and SBA, shuffling gait pattern and v/c for decreased path deviation with RW. Ascend/descend 4 stairs with one handrail laterally and with min assist. Pt has onset of BLE muscle spasms while descending stairs. Pt is able to safely descend stairs and return to w/c with min assist. Nustep level 3 x 5 min BLE only. Pt left seated in recliner in room with needs in reach.  Therapy Documentation Precautions:  Precautions Precautions: Fall Precaution Comments: Low vision at baseline; BPPV Required Braces or Orthoses: Other Brace/Splint Other Brace/Splint: soft collar for comfort Restrictions Weight Bearing Restrictions: No  See Function Navigator for Current Functional Status.  Therapy/Group: Individual Therapy  Excell Seltzer, PT, DPT  12/11/2017, 1:33 PM

## 2017-12-11 NOTE — Progress Notes (Signed)
Speech Language Pathology Daily Session Note  Patient Details  Name: Sandy Owens MRN: [id number] Date of Birth: 17-Mar-1924  Today's Date: 12/11/2017 SLP Individual Time: 1030-1100 SLP Individual Time Calculation (min): 30 min  Short Term Goals: Week 1: SLP Short Term Goal 1 (Week 1): STG=LTG due to ELOS   Skilled Therapeutic Interventions: Skilled treatment session focused on cognition goals. SLP facilitated session by providing supervision to [place] A cues to effectively answer questions related to anticipatory awareness. SLP also reviewed compensatory memory strategies. Pt was returned to room, left upright in wheelchair with all needs within reach. Continue per current plan of care.      Function:  Eating Eating                 Cognition Comprehension Comprehension assist level: Follows basic conversation/direction with no assist;Follows basic conversation/direction with extra time/assistive device  Expression   Expression assist level: Expresses basic needs/ideas: With extra time/assistive device  Social Interaction Social Interaction assist level: Interacts appropriately with others with medication or extra time (anti-anxiety, antidepressant).  Problem Solving Problem solving assist level: Solves basic 90% of the time/requires cueing < 10% of the time;Solves basic problems with no assist  Memory Memory assist level: Recognizes or recalls 90% of the time/requires cueing < 10% of the time;More than reasonable amount of time    Pain Pain Assessment Pain Type: Other (Comment)(pain with movement of shoulder)  Therapy/Group: Individual Therapy  Deuce Paternoster 12/11/2017, 1:52 PM

## 2017-12-11 NOTE — Progress Notes (Signed)
Occupational Therapy Session Note  Patient Details  Name: Sandy Owens MRN: [id number] Date of Birth: 09-05-1923  Today's Date: 12/11/2017 OT Individual Time: 9688-6484 OT Individual Time Calculation (min): 55 min    Short Term Goals: Week 1:  OT Short Term Goal 1 (Week 1): STGs=LTGs secondary to short estimated LOS  Skilled Therapeutic Interventions/Progress Updates:    Upon entering the room, pt supine in bed and reports fatigue but agreeable to OT intervention. Pt with no c/o pain this session. Pt performed bed mobility and stand pivot transfer with overall supervision and use of RW. OT assisted pt via wheelchair to day room for energy conservation. Pt engaged in B UE strengthening with use of 1 lb resistive hand weights 3 sets of 10 straight arm raises, bicep curls, and alternating punches with frequent rest breaks needed. Pt propelled wheelchair 100' back to room to continue to address strength and endurance for B UEs. Pt remained in wheelchair with call bell within reach.    Therapy Documentation Precautions:  Precautions Precautions: Fall Precaution Comments: Low vision at baseline; BPPV Required Braces or Orthoses: Other Brace/Splint Other Brace/Splint: soft collar for comfort Restrictions Weight Bearing Restrictions: No General:   Vital Signs: Therapy Vitals Pulse Rate: (!) 56 BP: (!) 165/56 Oxygen Therapy SpO2: 99 % O2 Device: Room Air Pain: Pain Assessment Pain Type: Other (Comment)(pain with movement of shoulder) ADL:  See Function Navigator for Current Functional Status.   Therapy/Group: Individual Therapy  Gypsy Decant 12/11/2017, 11:05 AM

## 2017-12-11 NOTE — Progress Notes (Signed)
Physical Therapy Session Note  Patient Details  Name: Sandy Owens MRN: [id number] Date of Birth: 1924-02-27  Today's Date: 12/11/2017 PT Individual Time: 1300-1350 PT Individual Time Calculation (min): 50 min   Short Term Goals: Week 1:  PT Short Term Goal 1 (Week 1): =LTG due to ELOS  Skilled Therapeutic Interventions/Progress Updates:   Pt in recliner and agreeable to therapy, denies pain at rest. Session focused on household mobility, endurance, and tolerance to standing activities. Pt ambulated 50' x3, 2 bouts required pt to ambulate in home-like environment, avoiding obstacles and moving around tight spaces. Performed standing tasks in multiple 2-3 min bouts w/ unilateral UE use, including horseshoes. Occasional min assist to correct LOB, otherwise min guard for safety w/ all mobility tasks. Pt w/ increased sleepiness and lethary towards end of session. Standing becoming more difficult. Returned to room and ended session in supine, call bell within reach and all needs met. Missed 25 min of skilled PT 2/2 fatigue.   Therapy Documentation Precautions:  Precautions Precautions: Fall Precaution Comments: Low vision at baseline; BPPV Required Braces or Orthoses: Other Brace/Splint Other Brace/Splint: soft collar for comfort Restrictions Weight Bearing Restrictions: No Pain:    See Function Navigator for Current Functional Status.   Therapy/Group: Individual Therapy  Erick Murin K Arnette 12/11/2017, 2:11 PM

## 2017-12-11 NOTE — Significant Event (Signed)
Hypoglycemic Event  CBG: 66  Treatment: 15 GM carbohydrate snack  Symptoms: None  Follow-up CBG: Time:1725 CBG Result:99  Possible Reasons for Event: Inadequate meal intake  Comments/MD notified:P.Love PA notified.  D/C Ensure shakes and changed to Premier shakes.  CBG at lunch 182 after Ensure, did not eat lunch also insulin not provided due to no meal intake.     Sandy Owens Sandy Owens

## 2017-12-12 ENCOUNTER — Inpatient Hospital Stay (HOSPITAL_COMMUNITY): Payer: Medicare HMO | Admitting: Physical Therapy

## 2017-12-12 ENCOUNTER — Inpatient Hospital Stay (HOSPITAL_COMMUNITY): Payer: Medicare HMO | Admitting: Speech Pathology

## 2017-12-12 ENCOUNTER — Inpatient Hospital Stay (HOSPITAL_COMMUNITY): Payer: Medicare HMO | Admitting: Occupational Therapy

## 2017-12-12 DIAGNOSIS — E1169 Type 2 diabetes mellitus with other specified complication: Secondary | ICD-10-CM

## 2017-12-12 DIAGNOSIS — S06301S Unspecified focal traumatic brain injury with loss of consciousness of 30 minutes or less, sequela: Secondary | ICD-10-CM

## 2017-12-12 DIAGNOSIS — E222 Syndrome of inappropriate secretion of antidiuretic hormone: Secondary | ICD-10-CM

## 2017-12-12 DIAGNOSIS — R7309 Other abnormal glucose: Secondary | ICD-10-CM

## 2017-12-12 DIAGNOSIS — E669 Obesity, unspecified: Secondary | ICD-10-CM

## 2017-12-12 DIAGNOSIS — I10 Essential (primary) hypertension: Secondary | ICD-10-CM

## 2017-12-12 DIAGNOSIS — N179 Acute kidney failure, unspecified: Secondary | ICD-10-CM

## 2017-12-12 LAB — GLUCOSE, CAPILLARY
Glucose-Capillary: 84 mg/dL (ref 65–99)
Glucose-Capillary: 110 mg/dL — ABNORMAL HIGH (ref 65–99)
Glucose-Capillary: 139 mg/dL — ABNORMAL HIGH (ref 65–99)
Glucose-Capillary: 168 mg/dL — ABNORMAL HIGH (ref 65–99)

## 2017-12-12 LAB — BASIC METABOLIC PANEL
Anion gap: 8 (ref 5–15)
BUN: 20 mg/dL (ref 6–20)
CO2: 26 mmol/L (ref 22–32)
Calcium: 8.8 mg/dL — ABNORMAL LOW (ref 8.9–10.3)
Chloride: 91 mmol/L — ABNORMAL LOW (ref 101–111)
Creatinine, Ser: 1.01 mg/dL — ABNORMAL HIGH (ref 0.44–1.00)
GFR calc Af Amer: 54 mL/min — ABNORMAL LOW (ref >60)
GFR calc non Af Amer: 47 mL/min — ABNORMAL LOW (ref >60)
Glucose, Bld: 96 mg/dL (ref 65–99)
Potassium: 4.8 mmol/L (ref 3.5–5.1)
Sodium: 125 mmol/L — ABNORMAL LOW (ref 135–145)

## 2017-12-12 MED ORDER — DEMECLOCYCLINE HCL 150 MG PO TABS
300.0000 mg | ORAL_TABLET | Freq: Two times a day (BID) | ORAL | Status: DC
  Administered 2017-12-12 – 2017-12-13 (×3): 300 mg via ORAL
  Filled 2017-12-12 (×3): qty 2

## 2017-12-12 MED ORDER — ATENOLOL 50 MG PO TABS: 50.0000 mg | ORAL_TABLET | Freq: Two times a day (BID) | ORAL | 0 refills | Status: DC

## 2017-12-12 MED ORDER — COLCHICINE 0.6 MG PO TABS: 0.3000 mg | ORAL_TABLET | Freq: Every day | ORAL | 0 refills | Status: DC

## 2017-12-12 MED ORDER — POLYETHYLENE GLYCOL 3350 17 G PO PACK: 17.0000 g | PACK | Freq: Every day | ORAL | 0 refills | Status: DC

## 2017-12-12 MED ORDER — DEMECLOCYCLINE HCL 150 MG PO TABS: 300.0000 mg | ORAL_TABLET | Freq: Two times a day (BID) | ORAL | 0 refills | Status: DC

## 2017-12-12 MED ORDER — FLEET ENEMA 7-19 GM/118ML RE ENEM
1.0000 | ENEMA | Freq: Every day | RECTAL | Status: DC | PRN
  Administered 2017-12-13: 1 via RECTAL
  Filled 2017-12-12: qty 1

## 2017-12-12 MED ORDER — GABAPENTIN 100 MG PO CAPS: 100.0000 mg | ORAL_CAPSULE | Freq: Two times a day (BID) | ORAL | 0 refills | Status: DC

## 2017-12-12 MED ORDER — POLYETHYLENE GLYCOL 3350 17 G PO PACK
17.0000 g | PACK | Freq: Every day | ORAL | Status: DC
  Administered 2017-12-12 – 2017-12-13 (×2): 17 g via ORAL
  Filled 2017-12-12: qty 1

## 2017-12-12 MED ORDER — LEVOTHYROXINE SODIUM 88 MCG PO TABS: 88.0000 ug | ORAL_TABLET | Freq: Every day | ORAL | 0 refills | Status: DC

## 2017-12-12 MED ORDER — DEMECLOCYCLINE HCL 150 MG PO TABS: 300.0000 mg | ORAL_TABLET | Freq: Two times a day (BID) | ORAL | Status: DC

## 2017-12-12 MED ORDER — LISINOPRIL 5 MG PO TABS: 5.0000 mg | ORAL_TABLET | Freq: Every day | ORAL | 0 refills | Status: DC

## 2017-12-12 MED ORDER — SODIUM CHLORIDE 1 G PO TABS: 1.0000 g | ORAL_TABLET | Freq: Two times a day (BID) | ORAL | 0 refills | Status: DC

## 2017-12-12 MED ORDER — POLYETHYLENE GLYCOL 3350 17 G PO PACK
17.0000 g | PACK | Freq: Every day | ORAL | Status: DC | PRN
  Administered 2017-12-12: 17 g via ORAL
  Filled 2017-12-12: qty 1

## 2017-12-12 MED ORDER — ACETAMINOPHEN 325 MG PO TABS: 325.0000 mg | ORAL_TABLET | ORAL | Status: DC | PRN

## 2017-12-12 MED ORDER — AMLODIPINE BESYLATE 5 MG PO TABS: 5.0000 mg | ORAL_TABLET | Freq: Every day | ORAL | 0 refills | Status: DC

## 2017-12-12 NOTE — Discharge Summary (Signed)
Discharge summary job # [id number]

## 2017-12-12 NOTE — Discharge Instructions (Signed)
Inpatient Rehab Discharge Instructions  [facility] Discharge date and time:    Activities/Precautions/ Functional Status: Activity: No lifting, driving, or strenuous exercise till cleared by MD Diet: Chopped foods. Limit water to 5 cups/day. Drink alternate fluids like milk, juice, gatorade, V8 juice.  Wound Care: keep wound clean and dry   Functional status:  ___ No restrictions     ___ Walk up steps independently _X__ 24/7 supervision/assistance   ___ Walk up steps with assistance ___ Intermittent supervision/assistance  ___ Bathe/dress independently ___ Walk with walker     _x__ Bathe/dress with assistance ___ Walk Independently    ___ Shower independently ___ Walk with assistance    ___ Shower with assistance ___ No alcohol     ___ Return to work/school ________   COMMUNITY REFERRALS UPON DISCHARGE:    Home Health:   PT     OT     ST                     Agency: [facility]    Phone: [phone]   Medical Equipment/Items Ordered:  Wheelchair, cushion, tub bench                                                      Agency/Supplier:  [facility] @ [phone]  Special Instructions: 1. Needs supervision with medication management. 2. Home health nurse to draw BMET on 04/15 with results to Dr. Naaman Plummer.    My questions have been answered and I understand these instructions. I will adhere to these goals and the provided educational materials after my discharge from the hospital.  Patient/Caregiver Signature _______________________________ Date __________  Clinician Signature _______________________________________ Date __________  Please bring this form and your medication list with you to all your follow-up doctor's appointments.

## 2017-12-12 NOTE — Plan of Care (Signed)
All LTGs achieved 12/12/17

## 2017-12-12 NOTE — Progress Notes (Signed)
Physical Therapy Session Note  Patient Details  Name: Sandy Owens MRN: [id number] Date of Birth: [birth year]  Today's Date: 12/12/2017 PT Individual Time: 0900-0945 PT Individual Time Calculation (min): 45 min   Short Term Goals: Week 1:  PT Short Term Goal 1 (Week 1): =LTG due to ELOS  Skilled Therapeutic Interventions/Progress Updates:    Pt semi-reclined in bed, agreeable to participate in therapy session. Bed mobility Supervision. Stand pivot transfer bed to w/c with RW and Supervision, v/c for hand placement during transfer and to back all the way up to the chair before sitting. Toilet transfer with [place] with use of RW, pt is able to complete 3/3 toileting steps with SBA. Ambulation x 75 ft with RW and Supervision before onset of BLE fatigue. Ascend/descend 2 stairs with one handrail laterally with Supervision. Car transfer with RW and Supervision, v/c for safe transfer technique. Pt left seated in w/c in room with needs in reach.  Therapy Documentation Precautions:  Precautions Precautions: Fall Precaution Comments: Low vision at baseline; BPPV Required Braces or Orthoses: Other Brace/Splint Other Brace/Splint: soft collar for comfort Restrictions Weight Bearing Restrictions: No  See Function Navigator for Current Functional Status.   Therapy/Group: Individual Therapy  Excell Seltzer, PT, DPT  12/12/2017, 2:45 PM

## 2017-12-12 NOTE — Progress Notes (Signed)
[facility] PHYSICAL MEDICINE & REHABILITATION     PROGRESS NOTE    Subjective/Complaints: Pt seen lying in bed this AM.  She slept well overnight.  She states she does not believe she had  BM yesterday.  She is looking forward to d/c tomorrow.   ROS: Denies CP, SOB, N/V/D.  Objective: Vital Signs: Blood pressure 111/65, pulse 67, temperature 98.3 F (36.8 C), temperature source Oral, resp. rate 17, height 5' (1.524 m), weight 62.1 kg (136 lb 14.5 oz), SpO2 98 %. No results found. No results for input(s): WBC, HGB, HCT, PLT in the last 72 hours. Recent Labs    12/11/17 0715 12/12/17 0734  NA 126* 125*  K 4.5 4.8  CL 92* 91*  GLUCOSE 86 96  BUN 18 20  CREATININE 1.02* 1.01*  CALCIUM 8.6* 8.8*   CBG (last 3)  Recent Labs    12/11/17 1725 12/11/17 2124 12/12/17 0623  GLUCAP 99 141* 84    Wt Readings from Last 3 Encounters:  12/10/17 62.1 kg (136 lb 14.5 oz)  11/27/17 59.8 kg (131 lb 13.4 oz)  12/31/15 62.1 kg (137 lb)    Physical Exam:  Constitutional: No distress . Vital signs reviewed. HENT: Facial edema.  Eyes: Right periorbital edema.  Cardiovascular: RRR. No JVD    Respiratory: CTA Bilaterally. Normal effort    GI: BS +, non-distended   Musculoskeletal: No edema or tenderness in extremities Neurological:  Alert, oriented.   Improving insight and awareness Motor: RUE: 4+/5 proximal to distal LUE: 4-4+/5 proximal to distal LLE: 4/5 proximal to distal RLE: 4/5 proximal to distal Skin: Clean and dry with resolving abrasions Psychiatric: pleasant   Assessment/Plan: 1. Functional deficits secondary to traumatic SAH which require 3+ hours per day of interdisciplinary therapy in a comprehensive inpatient rehab setting. Physiatrist is providing close team supervision and 24 hour management of active medical problems listed below. Physiatrist and rehab team continue to assess barriers to discharge/monitor patient progress toward functional and medical  goals.  Function:  Bathing Bathing position   Position: Shower  Bathing parts Body parts bathed by patient: Right arm, Left arm, Chest, Abdomen, Front perineal area, Buttocks, Right upper leg, Left upper leg, Right lower leg, Left lower leg Body parts bathed by helper: Back  Bathing assist Assist Level: Touching or steadying assistance(Pt > 75%), Assistive device Assistive Device Comment: LH sponge    Upper Body Dressing/Undressing Upper body dressing   What is the patient wearing?: Pull over shirt/dress     Pull over shirt/dress - Perfomed by patient: Thread/unthread right sleeve, Thread/unthread left sleeve, Put head through opening, Pull shirt over trunk Pull over shirt/dress - Perfomed by helper: Pull shirt over trunk        Upper body assist Assist Level: Supervision or verbal cues      Lower Body Dressing/Undressing Lower body dressing   What is the patient wearing?: Pants, Non-skid slipper socks, Underwear Underwear - Performed by patient: Thread/unthread right underwear leg, Thread/unthread left underwear leg, Pull underwear up/down   Pants- Performed by patient: Thread/unthread right pants leg, Thread/unthread left pants leg, Pull pants up/down Pants- Performed by helper: Pull pants up/down Non-skid slipper socks- Performed by patient: Don/doff right sock, Don/doff left sock Non-skid slipper socks- Performed by helper: Don/doff right sock, Don/doff left sock               TED Hose - Performed by helper: Don/doff right TED hose, Don/doff left TED hose  Lower body assist Assist for  lower body dressing: Supervision or verbal cues, Assistive device Assistive Device Comment: footstool    Toileting Toileting   Toileting steps completed by patient: Adjust clothing prior to toileting, Performs perineal hygiene, Adjust clothing after toileting Toileting steps completed by helper: Performs perineal hygiene Toileting Assistive Devices: Grab bar or rail  Toileting  assist Assist level: Supervision or verbal cues   Transfers Chair/bed transfer   Chair/bed transfer method: Stand pivot Chair/bed transfer assist level: Supervision or verbal cues Chair/bed transfer assistive device: Medical sales representative     Max distance: 67' Assist level: Supervision or verbal cues   Wheelchair          Cognition Comprehension Comprehension assist level: Follows basic conversation/direction with no assist, Follows basic conversation/direction with extra time/assistive device  Expression Expression assist level: Expresses basic needs/ideas: With extra time/assistive device  Social Interaction Social Interaction assist level: Interacts appropriately with others with medication or extra time (anti-anxiety, antidepressant).  Problem Solving Problem solving assist level: Solves basic 90% of the time/requires cueing < 10% of the time, Solves basic problems with no assist  Memory Memory assist level: Recognizes or recalls 90% of the time/requires cueing < 10% of the time, More than reasonable amount of time   Medical Problem List and Plan: 1.  Decreased functional mobility secondary to [facility] as well as multiple facial fractures, nasal septum fracture with facial contusions.  Acute nondisplaced C4 and C5 spinous process fractures-soft cervical collar for comfort.  Multiple tooth extractions after fall 11/26/2017   -Continue CIR, plan for d/c tomorrow  -Patient to see Rehab MD/provider in the office for transitional care encounter in 1-2 weeks.  2.  DVT Prophylaxis/Anticoagulation: Subcutaneous Lovenox initiated 12/01/2017.    -vascular study was negative on 4/4 3. Pain Management: Neurontin 100 mg twice daily, Anaprox 275 mg twice daily, hydrocodone/Ultram as needed 4. Mood: Provide emotional support 5. Neuropsych: This patient is capable of making decisions on her own behalf. 6. Skin/Wound Care: Local care and skin checks 7. Fluids/Electrolytes/Nutrition:  .    -Continue to encourage intake.     -SIADH: Na 125 on 4/12    -adjusted Fluid restriction to 1200 cc on 4/9    -salt tabs    -urine sodium elevated    -continue daily bmet   -continue declomycin 150mg  bid initiated 12/10/17, increased to 300 BID on 4/12    -nutrition consult appreciated 8.  Acute blood loss anemia.       Hemoglobin 10.3 on 4/8   -Continue to monitor 9.  Urinary retention.  resolved   10.  Hypertension.  Norvasc 5 mg daily, Tenormin 50 mg twice daily, lisinopril 5 mg daily.      Relatively controlled on 4/12 11.  History of gout.  Colchicine 0.6 mg daily 12.  History of CAD with CABG 2001. Continue to monitor 13.  History of left breast cancer with mastectomy 14.  Hypothyroidism.  Synthroid 15.  Diabetes mellitus.   - Patient on Amaryl 1 mg daily prior to admission  -  Amaryl 1 mg initially resumed but then held due to hypoglycemia   Labile on 4/12 16. Shortness of breath:   Improved    17. AKI   Cr 1.01 on 4/12   Cont to monitor      LOS (Days) 9 A FACE TO FACE EVALUATION WAS PERFORMED  Ahava Kissoon Lorie Phenix, MD 12/12/2017 9:36 AM

## 2017-12-12 NOTE — Progress Notes (Signed)
Speech Language Pathology Discharge Summary  Patient Details  Name: Sandy Owens MRN: [id number] Date of Birth: May 02, 1924  Today's Date: 12/12/2017 SLP Individual Time: 0815-0900 SLP Individual Time Calculation (min): 45 min   Skilled Therapeutic Interventions:  Skilled treatment session focused on cognition goals. SLP received pt in bed eating breakfast. Pt with general confusion about liquid medicine in front of her and where her milk was. Pt evidently had robitusin in front of her. SLP facilitated by reviewing items etc. Pt unable to recall when daughter was coming in for possibility of family education. SLP reviewed handout on compensatory memory strategies and left for daughter on board.      Patient has met 4 of 4 long term goals.  Patient to discharge at overall Supervision level.   Clinical Impression/Discharge Summary:   Pt has made progress in skilled ST sessions and as a result she has met 4 out of 4 LTGs and is currently at a supervision level of support. Pt with some fluctuation and may require intermittent MIn A especially in the setting of her visual deficits. Recommend 24 hour supervision and increased support as needed. Recommend HHST to target memory, safety awareness within home environment, completion of semi-complex to complex problem solving tasks and selective attention.   Care Partner:  Caregiver Able to Provide Assistance: Yes  Type of Caregiver Assistance: Cognitive  Recommendation:  24 hour supervision/assistance;Home Health SLP  Rationale for SLP Follow Up: Maximize cognitive function and independence;Reduce caregiver burden   Equipment:     Reasons for discharge: Discharged from hospital;Treatment goals met   Patient/Family Agrees with Progress Made and Goals Achieved: Yes   Function:    Cognition Comprehension Comprehension assist level: Follows basic conversation/direction with no assist;Follows basic conversation/direction with extra  time/assistive device  Expression   Expression assist level: Expresses basic needs/ideas: With extra time/assistive device  Social Interaction Social Interaction assist level: Interacts appropriately with others with medication or extra time (anti-anxiety, antidepressant).  Problem Solving Problem solving assist level: Solves basic 90% of the time/requires cueing < 10% of the time;Solves basic problems with no assist  Memory Memory assist level: Recognizes or recalls 90% of the time/requires cueing < 10% of the time;More than reasonable amount of time   Ericha Whittingham 12/12/2017, 9:05 AM

## 2017-12-12 NOTE — Progress Notes (Signed)
Social Work  Discharge Note  The overall goal for the admission was met for:   Discharge location: Yes - home with daughter able to provide 24/7 support  Length of Stay: Yes - 10 days  (with d/c on 4/13)  Discharge activity level: Yes - modified independent  Home/community participation: Yes  Services provided included: MD, RD, PT, OT, SLP, RN, TR, Pharmacy and [place] Medicare  Follow-up services arranged: Home Health: PT, OT, ST via [facility], DME: 16x16 lightweight w/c, cushion, tub bench via [facility] and Patient/Family has no preference for HH/DME agencies  Comments (or additional information):  Patient/Family verbalized understanding of follow-up arrangements: Yes  Individual responsible for coordination of the follow-up plan: pt  Confirmed correct DME delivered: Stanly Si 12/12/2017    Kaine Mcquillen

## 2017-12-12 NOTE — Discharge Summary (Signed)
Occupational Therapy Discharge Summary  Patient Details  Name: Sandy Owens MRN: [id number] Date of Birth: 03/05/1924  Today's Date: 12/12/2017 OT Individual Time: 8676-7209 OT Individual Time Calculation (min): 72 min   Patient has met 10 of 10 long term goals due to improved activity tolerance, improved balance, postural control, ability to compensate for deficits, improved awareness and improved coordination.  Patient to discharge at overall Supervision level.  Patient's children have been present during sessions, asking questions and planning for d/c so they can  provide the necessary supervision assistance at discharge.    All goals met.   Recommendation:  Patient will benefit from ongoing skilled OT services in outpatient setting to continue to advance functional skills in the area of BADL and iADL.  Equipment: TTB  Reasons for discharge: treatment goals met and discharge from hospital  Patient/family agrees with progress made and goals achieved: Yes   Skilled Therapeutic Intervention:  Pt greeted supine in bed with dtr [name]) present. Amenable to shower. Tx focus on balance, activity tolerance, pt/family education, and d/c planning. Pts daughter had hands on practice during toileting, bathing, dressing, and functional transfers during session. She was provided instruction regarding adaptive B/D techniques for energy conservation; RW safety; and w/c parts mgt. We also practiced TTB transfers in tub shower room per simulating setup at home. OT educated them on bathroom modifications to implement to maximize safety. At end of session, pts dtr with no further questions. Checked her off as trained caregiver on safety plan. Pt was repositioned for comfort in bed and left with all needs and dtr present.   OT Discharge Precautions/Restrictions  Precautions Precautions: Fall Precaution Comments: Low vision at baseline; BPPV Required Braces or Orthoses: Other Brace/Splint Other  Brace/Splint: soft collar for comfort Restrictions Weight Bearing Restrictions: No Vital Signs Therapy Vitals Temp: 98.3 F (36.8 C) Temp Source: Oral Pulse Rate: (!) 56 Resp: 20 BP: (!) 145/49 Patient Position (if appropriate): Lying Oxygen Therapy SpO2: 97 % O2 Device: Room Air ADL ADL ADL Comments: Please see functional navigator for ADL status Vision Baseline Vision/History: Wears glasses Wears Glasses: At all times Patient Visual Report: Blurring of vision;Peripheral vision impairment;No change from baseline(Has low vision per baseline) Perception  Perception: Within Functional Limits Praxis Praxis: Intact Cognition Overall Cognitive Status: Impaired/Different from baseline Arousal/Alertness: Awake/alert Orientation Level: Oriented X4 Attention: Sustained Sustained Attention: Impaired Selective Attention: Impaired Memory: Impaired Problem Solving: Impaired Safety/Judgment: Impaired [employer] Scales of Cognitive Functioning: Purposeful/appropriate Sensation Sensation Light Touch: Appears Intact Proprioception: Appears Intact Coordination Gross Motor Movements are Fluid and Coordinated: Yes Fine Motor Movements are Fluid and Coordinated: Yes Coordination and Movement Description: WFL for ADL tasks assessed.  ADL performance mostly affected by back pain, significant fatigue, and low vision/BPPV Motor  Motor Motor: Within Functional Limits Motor - Discharge Observations: Global weakness + decreased endurance  Mobility  Transfers Transfers: Sit to Stand;Stand to Sit Sit to Stand: From toilet;5: Supervision Sit to Stand Details: Verbal cues for precautions/safety Stand to Sit: To toilet;5: Supervision Stand to Sit Details (indicate cue type and reason): Verbal cues for precautions/safety  Trunk/Postural Assessment  Cervical Assessment Cervical Assessment: Exceptions to WFL(N/A due to cervical fxs/restrictions) Thoracic Assessment Thoracic Assessment:  Exceptions to WFL(Kyphotic) Lumbar Assessment Lumbar Assessment: Within Functional Limits Postural Control Postural Control: Within Functional Limits  Balance Balance Balance Assessed: Yes Dynamic Sitting Balance Dynamic Sitting - Balance Support: Feet supported;No upper extremity supported Dynamic Sitting - Level of Assistance: 5: Stand by assistance Sitting balance -  Comments: Bathing on TTB Dynamic Standing Balance Dynamic Standing - Balance Support: During functional activity;Left upper extremity supported Dynamic Standing - Level of Assistance: 5: Stand by assistance(Clothing mgt during toileting) Extremity/Trunk Assessment RUE Assessment RUE Assessment: Exceptions to WFL(3+/5 proximal to distal) LUE Assessment LUE Assessment: Exceptions to WFL(3+/5 proximal to distal)   See Function Navigator for Current Functional Status.  Rahm Minix A Jenniger Figiel 12/12/2017, 4:00 PM

## 2017-12-12 NOTE — Progress Notes (Signed)
Physical Therapy Discharge Summary  Patient Details  Name: Sandy Owens MRN: [id number] Date of Birth: 05-30-1924  Today's Date: 12/12/2017 PT Individual Time: 1015-1055 PT Individual Time Calculation (min): 40 min   Pt in w/c and agreeable to therapy, denies pain. Performed Berg Balance Scale as detailed below and explained results to pt. Pt required frequent seated rest 2/2 fatigue. Reinforced d/c recommendation of 24/7 supervision from pt's daughter and/or son when he is in town. Pt in agreement and verbalized understanding. Returned to room and assisted w/ positioning in supine for head and neck support. Ended session in supine, call bell within reach and all needs met.   Patient has met 5 of 5 long term goals due to improved activity tolerance, improved balance, improved postural control, increased strength, decreased pain, ability to compensate for deficits, improved attention, improved awareness and improved coordination.  Patient to discharge at an ambulatory level Supervision.   Patient's care partner is independent to provide the necessary physical assistance at discharge.  Reasons goals not met: n/a  Recommendation:  Patient will benefit from ongoing skilled PT services in home health setting to continue to advance safe functional mobility, address ongoing impairments in balance, endurance, functional mobility, and LE strengthening, and minimize fall risk.  Equipment: No equipment provided  Reasons for discharge: treatment goals met and discharge from hospital  Patient/family agrees with progress made and goals achieved: Yes  PT Discharge Precautions/Restrictions Precautions Precautions: Fall Precaution Comments: Low vision at baseline; BPPV Required Braces or Orthoses: Other Brace/Splint Other Brace/Splint: soft collar for comfort Restrictions Weight Bearing Restrictions: No Pain Pain Assessment Pain Scale: 0-10 Pain Score: 0-No pain Vision/Perception   Perception Perception: Within Functional Limits Praxis Praxis: Intact  Cognition Overall Cognitive Status: Impaired/Different from baseline Arousal/Alertness: Awake/alert Orientation Level: Oriented X4 Attention: Selective Selective Attention: Impaired Selective Attention Impairment: Functional complex Memory: Impaired Memory Impairment: Retrieval deficit;Decreased recall of new information Awareness: Impaired Awareness Impairment: Anticipatory impairment Problem Solving: Impaired Problem Solving Impairment: Functional complex Safety/Judgment: Impaired Comments: Decreased awareness of deficits Rancho Los Amigos Scales of Cognitive Functioning: Purposeful/appropriate Sensation Sensation Light Touch: Appears Intact Proprioception: Appears Intact Motor  Motor Motor: Within Functional Limits Motor - Discharge Observations: generalized weakness  Mobility Bed Mobility Bed Mobility: Rolling Right;Rolling Left;Supine to Sit;Sit to Supine Rolling Right: 5: Supervision Rolling Left: 5: Supervision Supine to Sit: 5: Supervision Sit to Supine: 5: Supervision Transfers Transfers: Yes Stand Pivot Transfers: 5: Supervision Locomotion  Ambulation Ambulation: Yes Ambulation/Gait Assistance: 5: Supervision Ambulation Distance (Feet): 75 Feet Assistive device: Rolling walker Ambulation/Gait Assistance Details: Verbal cues for precautions/safety Gait Gait velocity: Decreased Stairs / Additional Locomotion Stairs: Yes Stairs Assistance: 5: Supervision Stair Management Technique: One rail Left Number of Stairs: 2 Height of Stairs: 6 Trunk/Postural Assessment  Cervical Assessment Cervical Assessment: Exceptions to [facility] Cervical AROM Overall Cervical AROM Comments: soft collar donned Thoracic Assessment Thoracic Assessment: Exceptions to [facility] Thoracic AROM Overall Thoracic AROM Comments: kyphotic Lumbar Assessment Lumbar Assessment: Within Functional Limits Postural  Control Postural Control: Within Functional Limits  Balance Balance Balance Assessed: Yes Standardized Balance Assessment Standardized Balance Assessment: Berg Balance Test Berg Balance Test Sit to Stand: Able to stand using hands after several tries Standing Unsupported: Needs several tries to stand 30 seconds unsupported Sitting with Back Unsupported but Feet Supported on Floor or Stool: Able to sit safely and securely 2 minutes Stand to Sit: Controls descent by using hands Transfers: Able to transfer with verbal cueing and /or supervision Standing Unsupported with Eyes Closed: Able  to stand 3 seconds Standing Ubsupported with Feet Together: Needs help to attain position and unable to hold for 15 seconds From Standing, Reach Forward with Outstretched Arm: Loses balance while trying/requires external support From Standing Position, Pick up Object from Floor: Unable to try/needs assist to keep balance From Standing Position, Turn to Look Behind Over each Shoulder: Needs supervision when turning Turn 360 Degrees: Needs assistance while turning Standing Unsupported, Alternately Place Feet on Step/Stool: Able to complete >2 steps/needs minimal assist Standing Unsupported, One Foot in Front: Needs help to step but can hold 15 seconds Standing on One Leg: Tries to lift leg/unable to hold 3 seconds but remains standing independently Total Score: 18 Static Sitting Balance Static Sitting - Balance Support: Bilateral upper extremity supported;Feet supported Static Sitting - Level of Assistance: 5: Stand by assistance Dynamic Sitting Balance Dynamic Sitting - Balance Support: Feet supported;No upper extremity supported Dynamic Sitting - Level of Assistance: 5: Stand by assistance Static Standing Balance Static Standing - Balance Support: Bilateral upper extremity supported;During functional activity Dynamic Standing Balance Dynamic Standing - Balance Support: Bilateral upper extremity  supported;During functional activity Dynamic Standing - Level of Assistance: 5: Stand by assistance Extremity Assessment  RLE Assessment RLE Assessment: Within Functional Limits LLE Assessment LLE Assessment: Within Functional Limits   See Function Navigator for Current Functional Status.  Iktan Aikman K Arnette 12/12/2017, 10:55 AM

## 2017-12-13 DIAGNOSIS — E119 Type 2 diabetes mellitus without complications: Secondary | ICD-10-CM

## 2017-12-13 DIAGNOSIS — Z7409 Other reduced mobility: Secondary | ICD-10-CM

## 2017-12-13 DIAGNOSIS — N189 Chronic kidney disease, unspecified: Secondary | ICD-10-CM

## 2017-12-13 DIAGNOSIS — E039 Hypothyroidism, unspecified: Secondary | ICD-10-CM

## 2017-12-13 DIAGNOSIS — I609 Nontraumatic subarachnoid hemorrhage, unspecified: Secondary | ICD-10-CM

## 2017-12-13 LAB — GLUCOSE, CAPILLARY: Glucose-Capillary: 97 mg/dL (ref 65–99)

## 2017-12-13 NOTE — Plan of Care (Signed)
  Problem: Consults Goal: RH GENERAL PATIENT EDUCATION Description See Patient Education module for education specifics. Outcome: Completed/Met   Problem: RH BOWEL ELIMINATION Goal: RH STG MANAGE BOWEL WITH ASSISTANCE Description STG Manage Bowel with  Mod  I Assistance.  Outcome: Completed/Met Goal: RH STG MANAGE BOWEL W/MEDICATION W/ASSISTANCE Description STG Manage Bowel with Medication with mod I Assistance.  Outcome: Completed/Met   Problem: RH KNOWLEDGE DEFICIT GENERAL Goal: RH STG INCREASE KNOWLEDGE OF SELF CARE AFTER HOSPITALIZATION Description Family will be able to manage care of patient at discharge using cues/resources independently  Outcome: Completed/Met

## 2017-12-13 NOTE — Progress Notes (Signed)
PHYSICAL MEDICINE & REHABILITATION     PROGRESS NOTE    Subjective/Complaints: Patient seen lying in bed this morning. She states she slept well overnight. She is hopeful for discharge today.  ROS: denies CP, SOB, N/V/D.  Objective: Vital Signs: Blood pressure (!) 140/53, pulse (!) 56, temperature 98.1 F (36.7 C), temperature source Oral, resp. rate 16, height 5' (1.524 m), weight 62.1 kg (136 lb 14.5 oz), SpO2 95 %. No results found. No results for input(s): WBC, HGB, HCT, PLT in the last 72 hours. Recent Labs    12/11/17 0715 12/12/17 0734  NA 126* 125*  K 4.5 4.8  CL 92* 91*  GLUCOSE 86 96  BUN 18 20  CREATININE 1.02* 1.01*  CALCIUM 8.6* 8.8*   CBG (last 3)  Recent Labs    12/12/17 1637 12/12/17 2114 12/13/17 0654  GLUCAP 139* 168* 97    Wt Readings from Last 3 Encounters:  12/10/17 62.1 kg (136 lb 14.5 oz)  11/27/17 59.8 kg (131 lb 13.4 oz)  12/31/15 62.1 kg (137 lb)    Physical Exam:  Constitutional: No distress . Vital signs reviewed. HENT: Facial edema.  Eyes: Right periorbital edema (stable).  Cardiovascular: RRR. No JVD    Respiratory: CTA Bilaterally.  Normal effort    GI: BS +, non-distended   Musculoskeletal: No edema or tenderness in extremities Neurological:  Alert, oriented.   Improving insight and awareness Motor: RUE: 4+/5 proximal to distal (unchanged) LUE: 4-4+/5 proximal to distal (unchanged) LLE: 4/5 proximal to distal RLE: 4/5 proximal to distal Skin: Clean and dry with resolving abrasions Psychiatric: pleasant   Assessment/Plan: 1. Functional deficits secondary to traumatic SAH which require 3+ hours per day of interdisciplinary therapy in a comprehensive inpatient rehab setting. Physiatrist is providing close team supervision and 24 hour management of active medical problems listed below. Physiatrist and rehab team continue to assess barriers to discharge/monitor patient progress toward functional and medical  goals.  Function:  Bathing Bathing position   Position: Shower  Bathing parts Body parts bathed by patient: Right arm, Left arm, Chest, Abdomen, Front perineal area, Buttocks, Right upper leg, Left upper leg, Right lower leg, Left lower leg, Back Body parts bathed by helper: Back  Bathing assist Assist Level: Supervision or verbal cues, Assistive device Assistive Device Comment: LH sponge    Upper Body Dressing/Undressing Upper body dressing   What is the patient wearing?: Pull over shirt/dress     Pull over shirt/dress - Perfomed by patient: Thread/unthread right sleeve, Thread/unthread left sleeve, Put head through opening, Pull shirt over trunk Pull over shirt/dress - Perfomed by helper: Pull shirt over trunk        Upper body assist Assist Level: Set up      Lower Body Dressing/Undressing Lower body dressing   What is the patient wearing?: Pants, Non-skid slipper socks, Underwear Underwear - Performed by patient: Thread/unthread right underwear leg, Thread/unthread left underwear leg, Pull underwear up/down   Pants- Performed by patient: Thread/unthread right pants leg, Thread/unthread left pants leg, Pull pants up/down Pants- Performed by helper: Pull pants up/down Non-skid slipper socks- Performed by patient: Don/doff right sock, Don/doff left sock Non-skid slipper socks- Performed by helper: Don/doff right sock, Don/doff left sock               TED Hose - Performed by helper: Don/doff right TED hose, Don/doff left TED hose  Lower body assist Assist for lower body dressing: Supervision or verbal cues, Assistive device Assistive Device  Comment: footstool    Child psychotherapist steps completed by patient: Adjust clothing prior to toileting, Performs perineal hygiene, Adjust clothing after toileting Toileting steps completed by helper: Performs perineal hygiene Toileting Assistive Devices: Grab bar or rail  Toileting assist Assist level: Set up/obtain  supplies   Transfers Chair/bed transfer   Chair/bed transfer method: Stand pivot Chair/bed transfer assist level: Supervision or verbal cues Chair/bed transfer assistive device: Medical sales representative     Max distance: 44' Assist level: Supervision or verbal cues   Wheelchair          Cognition Comprehension Comprehension assist level: Follows basic conversation/direction with no assist  Expression Expression assist level: Expresses basic needs/ideas: With extra time/assistive device  Social Interaction Social Interaction assist level: Interacts appropriately with others with medication or extra time (anti-anxiety, antidepressant).  Problem Solving Problem solving assist level: Solves basic 90% of the time/requires cueing < 10% of the time, Solves basic problems with no assist  Memory Memory assist level: Recognizes or recalls 90% of the time/requires cueing < 10% of the time, More than reasonable amount of time   Medical Problem List and Plan: 1.  Decreased functional mobility secondary to [facility] as well as multiple facial fractures, nasal septum fracture with facial contusions.  Acute nondisplaced C4 and C5 spinous process fractures-soft cervical collar for comfort.  Multiple tooth extractions after fall 11/26/2017   DC today  -Patient to see Rehab MD/provider in the office for transitional care encounter in 1-2 weeks.  2.  DVT Prophylaxis/Anticoagulation: Subcutaneous Lovenox initiated 12/01/2017.    -vascular study was negative on 4/4 3. Pain Management: Neurontin 100 mg twice daily, Anaprox 275 mg twice daily, hydrocodone/Ultram as needed 4. Mood: Provide emotional support 5. Neuropsych: This patient is capable of making decisions on her own behalf. 6. Skin/Wound Care: Local care and skin checks 7. Fluids/Electrolytes/Nutrition:  .   -Continue to encourage intake.     -SIADH: Na 125 on 4/12, will need ambulatory labs    -adjusted Fluid restriction to 1200 cc on 4/9     -salt tabs    -urine sodium elevated    -continue daily bmet   -continue declomycin 150mg  bid initiated 12/10/17, increased to 300 BID on 4/12    -nutrition consult appreciated 8.  Acute blood loss anemia.       Hemoglobin 10.3 on 4/8   -Continue to monitor 9.  Urinary retention.  resolved   10.  Hypertension.  Norvasc 5 mg daily, Tenormin 50 mg twice daily, lisinopril 5 mg daily.      Slightly elevated on 4/13, will need ambulatory monitoring 11.  History of gout.  Colchicine 0.6 mg daily 12.  History of CAD with CABG 2001. Continue to monitor 13.  History of left breast cancer with mastectomy 14.  Hypothyroidism.  Synthroid 15.  Diabetes mellitus.   - Patient on Amaryl 1 mg daily prior to admission  -  Amaryl 1 mg initially resumed but then held due to hypoglycemia   Labile on 4/12, will be ambulatory monitoring 16. Shortness of breath:   Improved    17. AKI   Cr 1.01 on 4/12   Cont to monitor     LOS (Days) 10 A FACE TO FACE EVALUATION WAS PERFORMED  Ankit Lorie Phenix, MD 12/13/2017 10:03 AM

## 2017-12-13 NOTE — Progress Notes (Signed)
Patient discharged home.  Left floor via wheelchair, escorted by nursing staff and family.  Patient and family verbalized discharge instructions as given by this nurse.  All patient belongings sent with patient, including DME (with exception of tub transfer bench as it was out of stock and order canceled).  Prescriptions previously sent electronically to pharmacy of choice.  Patient appears to be in no immediate distress at this time.  Brita Romp, RN

## 2017-12-14 DIAGNOSIS — S12401D Unspecified nondisplaced fracture of fifth cervical vertebra, subsequent encounter for fracture with routine healing: Secondary | ICD-10-CM | POA: Diagnosis not present

## 2017-12-14 DIAGNOSIS — S0292XD Unspecified fracture of facial bones, subsequent encounter for fracture with routine healing: Secondary | ICD-10-CM | POA: Diagnosis not present

## 2017-12-14 DIAGNOSIS — S0242XD Fracture of alveolus of maxilla, subsequent encounter for fracture with routine healing: Secondary | ICD-10-CM | POA: Diagnosis not present

## 2017-12-14 DIAGNOSIS — S12301D Unspecified nondisplaced fracture of fourth cervical vertebra, subsequent encounter for fracture with routine healing: Secondary | ICD-10-CM | POA: Diagnosis not present

## 2017-12-14 DIAGNOSIS — S032XXD Dislocation of tooth, subsequent encounter: Secondary | ICD-10-CM | POA: Diagnosis not present

## 2017-12-14 DIAGNOSIS — S066X1D Traumatic subarachnoid hemorrhage with loss of consciousness of 30 minutes or less, subsequent encounter: Secondary | ICD-10-CM | POA: Diagnosis not present

## 2017-12-14 DIAGNOSIS — R531 Weakness: Secondary | ICD-10-CM | POA: Diagnosis not present

## 2017-12-15 NOTE — Discharge Summary (Signed)
NAME:  Sandy Owens, Sandy Owens                  ACCOUNT NO.:  MEDICAL RECORD NO.:  [id number]  LOCATION:                                 FACILITY:  PHYSICIAN:  Meredith Staggers, M.D.DATE OF BIRTH:  03-02-1924  DATE OF ADMISSION:  12/03/2017 DATE OF DISCHARGE:  12/13/2017                              DISCHARGE SUMMARY   DISCHARGE DIAGNOSES: 1. Subarachnoid hemorrhage as well as multiple facial fractures after     fall. 2. Acute nondisplaced C4 and C5 spinous process fractures after fall. 3. Subcutaneous Lovenox for deep vein thrombosis prophylaxis. 4. Pain management. 5. Acute blood loss anemia. 6. Urinary retention, resolved. 7. Hypertension. 8. History of gout. 9. Coronary artery disease with coronary artery bypass graft. 10.Diabetes mellitus. 11.Acute kidney injury with syndrome of inappropriate antidiuretic     hormone secretion. A 84 year old right-handed female, history of breast cancer, CAD with CABG, hypertension, diabetes mellitus, presented on November 26, 2017, after a ground-level fall.  She lives alone.  Manages her own bills and finances.  She has family in the area.  Cranial CT scan showed SAH. Cervical spine and maxillofacial films revealed acute moderate-volume subarachnoid hemorrhage, acute open fracture through anterior maxilla including alveolar ridge, multiple teeth avulsion fractures, acute right nasal bone fracture, facial hematoma.  Acute nondisplaced C4 and C5 spinous process fractures.  Conservative care of traumatic subarachnoid hemorrhage.  Underwent multiple tooth extractions.  Incidental findings of carotid stenosis.  Plan for CTA of neck as an outpatient as needed. Conservative care of C4 and C5 spinous process fractures.  Acute blood loss anemia, 8.9 and monitored.  Followup cranial CT scan, stable. Subcutaneous Lovenox for DVT prophylaxis.  The patient was admitted for comprehensive rehab program.  PAST MEDICAL HISTORY:  See discharge  diagnoses.  SOCIAL HISTORY:  Lives alone, independent prior to admission.  FUNCTIONAL STATUS:  Upon admission to [facility] was minimal assist, 60 feet rolling walker; minimal assist, sit to stand; max total assist, activities daily living.  PHYSICAL EXAMINATION:  VITAL SIGNS:  Blood pressure 147/53, pulse 61, temperature 98, and respirations 21. GENERAL:  Alert female, multiple bruises to the face and forehead. HEENT:  EOMs intact. NECK:  Supple.  Nontender.  No JVD. CARDIAC:  Rate controlled. ABDOMEN:  Soft, nontender.  Good bowel sounds. LUNGS:  Clear to auscultation without wheeze. MUSCULOSKELETAL:  Manual muscle strength 4- to 4/5 throughout.  REHABILITATION HOSPITAL COURSE:  The patient was admitted to Inpatient Rehab Services with therapies initiated on a 3-hour daily basis consisting of physical therapy, occupational therapy and rehabilitation nursing as well as speech therapy.  The following issues were addressed during the patient's rehabilitation stay.  Pertaining to Sandy Owens, traumatic subarachnoid hemorrhage after a fall, multitrauma, remained stable, conservative care.  She would follow up with Neurosurgery, Dr. Sherley Bounds.  Conservative care of C4 and C5 spinous process fractures. Manual muscle testing 4/5 throughout.  She had undergone multiple tooth extractions with multiple facial fractures followed by ENT. Subcutaneous Lovenox for DVT prophylaxis.  No bleeding episodes.  Blood pressures remained well controlled on Norvasc as well as Tenormin. Noted hyponatremia, SIADH.  Fluid restriction as advised.  Salt tablets. Urine sodium mildly  elevated, placed on Declomycin, increased to 300 mg twice daily on December 12, 2017.  Blood sugars remained well controlled. She remained on hormone supplement for hypothyroidism.  The patient received weekly collaborative interdisciplinary team conferences to discuss estimated length of stay, family teaching, any barriers  to discharge.  She is ambulating 100 feet rolling walker, standby assistance.  Minimal assist to navigate stairs.  She could gather her belongings for activities of daily living and homemaking.  Speech Therapy followup for any cognitive issues.  She had some general confusion, but overall is able to fully communicate her needs.  Advised the need for supervision level.  She was on a dysphagia #2 thin liquid diet after multiple tooth extractions.  Full family teaching was completed and plan discharge to home.  DISCHARGE MEDICATIONS:  Included: 1. Norvasc 5 mg p.o. daily. 2. Aspirin 81 mg p.o. daily. 3. Tenormin 50 mg p.o. b.i.d. 4. Colchicine 0.3 mg p.o. daily. 5. Declomycin 300 mg p.o. every 12 hours. 6. Neurontin 100 mg p.o. b.i.d. 7. Synthroid 88 mcg p.o. daily. 8. Lisinopril 5 mg p.o. daily. 9. Multivitamin daily. 10.Sodium chloride 1 g p.o. b.i.d. 11.Oxycodone 2.5 mg p.o. every 4 hours as needed pain. Her diet was a dysphagia #2 thin liquid diet, 1200 mL fluid restriction.  SPECIAL INSTRUCTIONS:  Amaryl held due to bouts of hypoglycemia.  The patient should have followup chemistries to monitor sodium levels while on Declomycin.  The patient would follow up with Dr. Alger Simons at the Outpatient Rehab Service office as advised; Dr. Diona Browner, call for appointment; Dr. Sherley Bounds, 2 weeks, call for appointment; Dr. Izora Gala and Dr. Ashby Dawes, medical management.     Lauraine Rinne, P.A.   ______________________________ Meredith Staggers, M.D.    DA/MEDQ  D:  12/12/2017  T:  12/13/2017  Job:  [id number]  cc:   Gae Bon, M.D. Merrilee Seashore, M.D. Jefry H. Constance Holster, MD Meredith Staggers, M.D. Eustace Moore, MD

## 2017-12-16 ENCOUNTER — Telehealth: Payer: Self-pay | Admitting: Registered Nurse

## 2017-12-16 DIAGNOSIS — S0242 Fracture of alveolus of maxilla: Secondary | ICD-10-CM | POA: Diagnosis not present

## 2017-12-16 NOTE — Telephone Encounter (Signed)
Transitional Care call Transitional Care Call Completed, Appointment Confirmed, Address Confirmed, New Patient Packet Mailed Transitional Care Call Questions answered by her daughter Ms. Lawerance Cruel.   Patient name: Sandy Owens DOB: 08/08/1924 1. Are you/is patient experiencing any problems since coming home? No a. Are there any questions regarding any aspect of care? No 2. Are there any questions regarding medications administration/dosing? Yes, the Declomycin cost 255.00, they were unable to pick up this medication due to cost. The pharmacy had to order the Sodium Chloride Tablets, she will start this medication this evening. She will be seeing her PCP Dr. Ashby Dawes on 12/17/2017, will have a BMP drawn to follow up on hyponatremia. She verbalizes understanding.  a. Are meds being taken as prescribed? All except, declomycin see above  b. "Patient should review meds with caller to confirm"  Medication list reviewed. 3. Have there been any falls? No 4. Has Home Health been to the house and/or have they contacted you? Yes, [facility] a. If not, have you tried to contact them? NA b. Can we help you contact them? NA 5. Are bowels and bladder emptying properly? Yes a. Are there any unexpected incontinence issues? No b. If applicable, is patient following bowel/bladder programs? NA 6. Any fevers, problems with breathing, unexpected pain? No 7. Are there any skin problems or new areas of breakdown? No 8. Has the patient/family member arranged specialty MD follow up (ie cardiology/neurology/renal/surgical/etc.)?  Yes a. Can we help arrange? NA 9. Does the patient need any other services or support that we can help arrange? NA 10. Are caregivers following through as expected in assisting the patient? Yes 11. Has the patient quit smoking, drinking alcohol, or using drugs as recommended? [name] states Ms. Panico doesn't smoke, drink alcohol or use illicit drugs  Appointment date/time  12/25/2017 arrival time 10:40 for 11:00 appointment with Hoyle Sauer, [place] [address] [phone]

## 2017-12-16 NOTE — Telephone Encounter (Signed)
Placed a Transitional call, spoke with Ms. Bush Ms. Powe daughter, she asked if I could call her back she was driving.

## 2017-12-17 ENCOUNTER — Telehealth: Payer: Self-pay | Admitting: *Deleted

## 2017-12-17 DIAGNOSIS — I609 Nontraumatic subarachnoid hemorrhage, unspecified: Secondary | ICD-10-CM | POA: Diagnosis not present

## 2017-12-17 DIAGNOSIS — I129 Hypertensive chronic kidney disease with stage 1 through stage 4 chronic kidney disease, or unspecified chronic kidney disease: Secondary | ICD-10-CM | POA: Diagnosis not present

## 2017-12-17 DIAGNOSIS — E871 Hypo-osmolality and hyponatremia: Secondary | ICD-10-CM | POA: Diagnosis not present

## 2017-12-17 DIAGNOSIS — S0242XD Fracture of alveolus of maxilla, subsequent encounter for fracture with routine healing: Secondary | ICD-10-CM | POA: Diagnosis not present

## 2017-12-17 DIAGNOSIS — S12391D Other nondisplaced fracture of fourth cervical vertebra, subsequent encounter for fracture with routine healing: Secondary | ICD-10-CM | POA: Diagnosis not present

## 2017-12-17 DIAGNOSIS — S0292XD Unspecified fracture of facial bones, subsequent encounter for fracture with routine healing: Secondary | ICD-10-CM | POA: Diagnosis not present

## 2017-12-17 DIAGNOSIS — S0292XA Unspecified fracture of facial bones, initial encounter for closed fracture: Secondary | ICD-10-CM | POA: Diagnosis not present

## 2017-12-17 DIAGNOSIS — S12491D Other nondisplaced fracture of fifth cervical vertebra, subsequent encounter for fracture with routine healing: Secondary | ICD-10-CM | POA: Diagnosis not present

## 2017-12-17 DIAGNOSIS — E119 Type 2 diabetes mellitus without complications: Secondary | ICD-10-CM | POA: Diagnosis not present

## 2017-12-17 DIAGNOSIS — S12401D Unspecified nondisplaced fracture of fifth cervical vertebra, subsequent encounter for fracture with routine healing: Secondary | ICD-10-CM | POA: Diagnosis not present

## 2017-12-17 DIAGNOSIS — N183 Chronic kidney disease, stage 3 (moderate): Secondary | ICD-10-CM | POA: Diagnosis not present

## 2017-12-17 DIAGNOSIS — Z09 Encounter for follow-up examination after completed treatment for conditions other than malignant neoplasm: Secondary | ICD-10-CM | POA: Diagnosis not present

## 2017-12-17 DIAGNOSIS — N182 Chronic kidney disease, stage 2 (mild): Secondary | ICD-10-CM | POA: Diagnosis not present

## 2017-12-17 DIAGNOSIS — S032XXD Dislocation of tooth, subsequent encounter: Secondary | ICD-10-CM | POA: Diagnosis not present

## 2017-12-17 DIAGNOSIS — S12301D Unspecified nondisplaced fracture of fourth cervical vertebra, subsequent encounter for fracture with routine healing: Secondary | ICD-10-CM | POA: Diagnosis not present

## 2017-12-17 DIAGNOSIS — E782 Mixed hyperlipidemia: Secondary | ICD-10-CM | POA: Diagnosis not present

## 2017-12-17 DIAGNOSIS — S066X1D Traumatic subarachnoid hemorrhage with loss of consciousness of 30 minutes or less, subsequent encounter: Secondary | ICD-10-CM | POA: Diagnosis not present

## 2017-12-17 DIAGNOSIS — E1122 Type 2 diabetes mellitus with diabetic chronic kidney disease: Secondary | ICD-10-CM | POA: Diagnosis not present

## 2017-12-17 NOTE — Telephone Encounter (Signed)
I spoke with [facility] office and My has appt today around 2 pm with their PA/NP.  I have faxed an order to their office to have them draw a BMP and fax results to our office.

## 2017-12-18 DIAGNOSIS — S0292XD Unspecified fracture of facial bones, subsequent encounter for fracture with routine healing: Secondary | ICD-10-CM | POA: Diagnosis not present

## 2017-12-18 DIAGNOSIS — S12401D Unspecified nondisplaced fracture of fifth cervical vertebra, subsequent encounter for fracture with routine healing: Secondary | ICD-10-CM | POA: Diagnosis not present

## 2017-12-18 DIAGNOSIS — S032XXD Dislocation of tooth, subsequent encounter: Secondary | ICD-10-CM | POA: Diagnosis not present

## 2017-12-18 DIAGNOSIS — S0242XD Fracture of alveolus of maxilla, subsequent encounter for fracture with routine healing: Secondary | ICD-10-CM | POA: Diagnosis not present

## 2017-12-18 DIAGNOSIS — S066X1D Traumatic subarachnoid hemorrhage with loss of consciousness of 30 minutes or less, subsequent encounter: Secondary | ICD-10-CM | POA: Diagnosis not present

## 2017-12-18 DIAGNOSIS — S12301D Unspecified nondisplaced fracture of fourth cervical vertebra, subsequent encounter for fracture with routine healing: Secondary | ICD-10-CM | POA: Diagnosis not present

## 2017-12-19 DIAGNOSIS — S12301D Unspecified nondisplaced fracture of fourth cervical vertebra, subsequent encounter for fracture with routine healing: Secondary | ICD-10-CM | POA: Diagnosis not present

## 2017-12-19 DIAGNOSIS — S0242XD Fracture of alveolus of maxilla, subsequent encounter for fracture with routine healing: Secondary | ICD-10-CM | POA: Diagnosis not present

## 2017-12-19 DIAGNOSIS — S032XXD Dislocation of tooth, subsequent encounter: Secondary | ICD-10-CM | POA: Diagnosis not present

## 2017-12-19 DIAGNOSIS — S066X1D Traumatic subarachnoid hemorrhage with loss of consciousness of 30 minutes or less, subsequent encounter: Secondary | ICD-10-CM | POA: Diagnosis not present

## 2017-12-19 DIAGNOSIS — S12401D Unspecified nondisplaced fracture of fifth cervical vertebra, subsequent encounter for fracture with routine healing: Secondary | ICD-10-CM | POA: Diagnosis not present

## 2017-12-19 DIAGNOSIS — S0292XD Unspecified fracture of facial bones, subsequent encounter for fracture with routine healing: Secondary | ICD-10-CM | POA: Diagnosis not present

## 2017-12-22 ENCOUNTER — Telehealth: Payer: Self-pay

## 2017-12-22 DIAGNOSIS — S0292XD Unspecified fracture of facial bones, subsequent encounter for fracture with routine healing: Secondary | ICD-10-CM | POA: Diagnosis not present

## 2017-12-22 DIAGNOSIS — S12401D Unspecified nondisplaced fracture of fifth cervical vertebra, subsequent encounter for fracture with routine healing: Secondary | ICD-10-CM | POA: Diagnosis not present

## 2017-12-22 DIAGNOSIS — S0242XD Fracture of alveolus of maxilla, subsequent encounter for fracture with routine healing: Secondary | ICD-10-CM | POA: Diagnosis not present

## 2017-12-22 DIAGNOSIS — S066X1D Traumatic subarachnoid hemorrhage with loss of consciousness of 30 minutes or less, subsequent encounter: Secondary | ICD-10-CM | POA: Diagnosis not present

## 2017-12-22 DIAGNOSIS — S12301D Unspecified nondisplaced fracture of fourth cervical vertebra, subsequent encounter for fracture with routine healing: Secondary | ICD-10-CM | POA: Diagnosis not present

## 2017-12-22 DIAGNOSIS — S032XXD Dislocation of tooth, subsequent encounter: Secondary | ICD-10-CM | POA: Diagnosis not present

## 2017-12-22 NOTE — Telephone Encounter (Signed)
[facility] called wanting verbal orders on pt for ST 1wk1 and 2wk7. Verbal orders left on [place] VM

## 2017-12-23 DIAGNOSIS — S12401D Unspecified nondisplaced fracture of fifth cervical vertebra, subsequent encounter for fracture with routine healing: Secondary | ICD-10-CM | POA: Diagnosis not present

## 2017-12-23 DIAGNOSIS — S066X1D Traumatic subarachnoid hemorrhage with loss of consciousness of 30 minutes or less, subsequent encounter: Secondary | ICD-10-CM | POA: Diagnosis not present

## 2017-12-23 DIAGNOSIS — S0242XD Fracture of alveolus of maxilla, subsequent encounter for fracture with routine healing: Secondary | ICD-10-CM | POA: Diagnosis not present

## 2017-12-23 DIAGNOSIS — S032XXD Dislocation of tooth, subsequent encounter: Secondary | ICD-10-CM | POA: Diagnosis not present

## 2017-12-23 DIAGNOSIS — S12301D Unspecified nondisplaced fracture of fourth cervical vertebra, subsequent encounter for fracture with routine healing: Secondary | ICD-10-CM | POA: Diagnosis not present

## 2017-12-23 DIAGNOSIS — S0292XD Unspecified fracture of facial bones, subsequent encounter for fracture with routine healing: Secondary | ICD-10-CM | POA: Diagnosis not present

## 2017-12-24 DIAGNOSIS — S12301D Unspecified nondisplaced fracture of fourth cervical vertebra, subsequent encounter for fracture with routine healing: Secondary | ICD-10-CM | POA: Diagnosis not present

## 2017-12-24 DIAGNOSIS — S0242XD Fracture of alveolus of maxilla, subsequent encounter for fracture with routine healing: Secondary | ICD-10-CM | POA: Diagnosis not present

## 2017-12-24 DIAGNOSIS — S066X1D Traumatic subarachnoid hemorrhage with loss of consciousness of 30 minutes or less, subsequent encounter: Secondary | ICD-10-CM | POA: Diagnosis not present

## 2017-12-24 DIAGNOSIS — S0292XD Unspecified fracture of facial bones, subsequent encounter for fracture with routine healing: Secondary | ICD-10-CM | POA: Diagnosis not present

## 2017-12-24 DIAGNOSIS — S032XXD Dislocation of tooth, subsequent encounter: Secondary | ICD-10-CM | POA: Diagnosis not present

## 2017-12-24 DIAGNOSIS — S12401D Unspecified nondisplaced fracture of fifth cervical vertebra, subsequent encounter for fracture with routine healing: Secondary | ICD-10-CM | POA: Diagnosis not present

## 2017-12-25 ENCOUNTER — Encounter: Payer: Self-pay | Admitting: Registered Nurse

## 2017-12-25 ENCOUNTER — Encounter: Payer: Medicare HMO | Attending: Registered Nurse | Admitting: Registered Nurse

## 2017-12-25 VITALS — BP 157/76 | HR 61 | Ht 60.0 in | Wt 131.4 lb

## 2017-12-25 DIAGNOSIS — S032XXD Dislocation of tooth, subsequent encounter: Secondary | ICD-10-CM | POA: Diagnosis not present

## 2017-12-25 DIAGNOSIS — S12401D Unspecified nondisplaced fracture of fifth cervical vertebra, subsequent encounter for fracture with routine healing: Secondary | ICD-10-CM | POA: Diagnosis not present

## 2017-12-25 DIAGNOSIS — E669 Obesity, unspecified: Secondary | ICD-10-CM | POA: Diagnosis not present

## 2017-12-25 DIAGNOSIS — Z9012 Acquired absence of left breast and nipple: Secondary | ICD-10-CM | POA: Diagnosis not present

## 2017-12-25 DIAGNOSIS — Z853 Personal history of malignant neoplasm of breast: Secondary | ICD-10-CM | POA: Insufficient documentation

## 2017-12-25 DIAGNOSIS — S129XXA Fracture of neck, unspecified, initial encounter: Secondary | ICD-10-CM | POA: Insufficient documentation

## 2017-12-25 DIAGNOSIS — E1169 Type 2 diabetes mellitus with other specified complication: Secondary | ICD-10-CM | POA: Diagnosis not present

## 2017-12-25 DIAGNOSIS — Z87891 Personal history of nicotine dependence: Secondary | ICD-10-CM | POA: Diagnosis not present

## 2017-12-25 DIAGNOSIS — S0292XD Unspecified fracture of facial bones, subsequent encounter for fracture with routine healing: Secondary | ICD-10-CM | POA: Diagnosis not present

## 2017-12-25 DIAGNOSIS — M109 Gout, unspecified: Secondary | ICD-10-CM | POA: Insufficient documentation

## 2017-12-25 DIAGNOSIS — E119 Type 2 diabetes mellitus without complications: Secondary | ICD-10-CM

## 2017-12-25 DIAGNOSIS — E1142 Type 2 diabetes mellitus with diabetic polyneuropathy: Secondary | ICD-10-CM

## 2017-12-25 DIAGNOSIS — Z90710 Acquired absence of both cervix and uterus: Secondary | ICD-10-CM | POA: Diagnosis not present

## 2017-12-25 DIAGNOSIS — Z9889 Other specified postprocedural states: Secondary | ICD-10-CM | POA: Insufficient documentation

## 2017-12-25 DIAGNOSIS — Z8781 Personal history of (healed) traumatic fracture: Secondary | ICD-10-CM

## 2017-12-25 DIAGNOSIS — E222 Syndrome of inappropriate secretion of antidiuretic hormone: Secondary | ICD-10-CM | POA: Diagnosis not present

## 2017-12-25 DIAGNOSIS — I609 Nontraumatic subarachnoid hemorrhage, unspecified: Secondary | ICD-10-CM | POA: Diagnosis not present

## 2017-12-25 DIAGNOSIS — X58XXXA Exposure to other specified factors, initial encounter: Secondary | ICD-10-CM | POA: Diagnosis not present

## 2017-12-25 DIAGNOSIS — S066X1D Traumatic subarachnoid hemorrhage with loss of consciousness of 30 minutes or less, subsequent encounter: Secondary | ICD-10-CM | POA: Diagnosis not present

## 2017-12-25 DIAGNOSIS — I10 Essential (primary) hypertension: Secondary | ICD-10-CM

## 2017-12-25 DIAGNOSIS — Z8739 Personal history of other diseases of the musculoskeletal system and connective tissue: Secondary | ICD-10-CM | POA: Diagnosis not present

## 2017-12-25 DIAGNOSIS — I2510 Atherosclerotic heart disease of native coronary artery without angina pectoris: Secondary | ICD-10-CM | POA: Insufficient documentation

## 2017-12-25 DIAGNOSIS — W19XXXD Unspecified fall, subsequent encounter: Secondary | ICD-10-CM

## 2017-12-25 DIAGNOSIS — S12301D Unspecified nondisplaced fracture of fourth cervical vertebra, subsequent encounter for fracture with routine healing: Secondary | ICD-10-CM | POA: Diagnosis not present

## 2017-12-25 DIAGNOSIS — S0242XD Fracture of alveolus of maxilla, subsequent encounter for fracture with routine healing: Secondary | ICD-10-CM | POA: Diagnosis not present

## 2017-12-25 NOTE — Progress Notes (Signed)
Subjective:    Patient ID: SHANICA CASTELLANOS, female    DOB: 1923-12-07, 84 y.o.   MRN: [id number]  HPI: Ms. TINNIE KUNIN is a 84 year old female who is here for transitional care visit in follow up on her Subarachnoid hemorrhage, SIADH, Type 2 DM, Type 2 DM with peripheral Neuropathy, Hypertension, Gout, Fall and Cervical Fracture. She denies pain reports lip with burning sensation. She rated her pain 0. Reports good appetite.  Dr. Ashby Dawes checked her BMP and following SIADH. She was instructed by her PCP to drink Gatorade and to take sodium tablet. She's schedule for a BMP check next week her daughter states.   Daughter in room all questions answered.   Pain Inventory Average Pain 0 Pain Right Now 0 My pain is na  In the last 24 hours, has pain interfered with the following? General activity 0 Relation with others 0 Enjoyment of life 0 What TIME of day is your pain at its worst? na Sleep (in general) Good  Pain is worse with: na Pain improves with: na Relief from Meds: na  Mobility use a walker ability to climb steps?  yes do you drive?  no  Function retired  Neuro/Psych trouble walking confusion loss of taste or smell  Prior Studies Any changes since last visit?  no  Physicians involved in your care Any changes since last visit?  no   Family History  Problem Relation Age of Onset  . Breast cancer Neg Hx    Social History   Socioeconomic History  . Marital status: Widowed    Spouse name: Not on file  . Number of children: Not on file  . Years of education: Not on file  . Highest education level: Not on file  Occupational History  . Not on file  Social Needs  . Financial resource strain: Not on file  . Food insecurity:    Worry: Not on file    Inability: Not on file  . Transportation needs:    Medical: Not on file    Non-medical: Not on file  Tobacco Use  . Smoking status: Former Research scientist (life sciences)  . Smokeless tobacco: Never Used  . Tobacco  comment: quit 40 years ago   Substance and Sexual Activity  . Alcohol use: Not Currently    Comment: occasional - drinks wine  . Drug use: No  . Sexual activity: Not on file  Lifestyle  . Physical activity:    Days per week: Not on file    Minutes per session: Not on file  . Stress: Not on file  Relationships  . Social connections:    Talks on phone: Not on file    Gets together: Not on file    Attends religious service: Not on file    Active member of club or organization: Not on file    Attends meetings of clubs or organizations: Not on file    Relationship status: Not on file  Other Topics Concern  . Not on file  Social History Narrative  . Not on file   Past Surgical History:  Procedure Laterality Date  . ABDOMINAL HYSTERECTOMY    . APPENDECTOMY    . BACK SURGERY    . cabg    . HIP SURGERY      right hip replacement  . JOINT REPLACEMENT     left knee replacement  . MASTECTOMY Left   . MULTIPLE EXTRACTIONS WITH ALVEOLOPLASTY N/A 11/28/2017   Procedure: MULTIPLE EXTRACTION WITH ALVEOLOPLASTY;  Surgeon: Diona Browner, DDS;  Location: [place];  Service: Oral Surgery;  Laterality: N/A;  . SIMPLE MASTECTOMY WITH AXILLARY SENTINEL NODE BIOPSY Left 06/06/2014   Procedure: LEFT SIMPLE MASTECTOMY;  Surgeon: Excell Seltzer, MD;  Location: [place];  Service: General;  Laterality: Left;   Past Medical History:  Diagnosis Date  . Cancer [facility])    left breast  . Coronary artery disease    CABG - 3- 2001  . Diabetes mellitus without complication ([place])   . Hypertension   . Hypothyroidism   . Neuromuscular disorder ([place])    neuropathy in feet  and legs   There were no vitals taken for this visit.  Opioid Risk Score:   Fall Risk Score:  `1  Depression screen PHQ 2/9  No flowsheet data found.   Review of Systems  Constitutional: Negative.   HENT: Negative.   Eyes: Positive for visual disturbance.  Respiratory: Negative.   Cardiovascular: Negative.     Gastrointestinal: Negative.   Endocrine: Negative.   Genitourinary: Negative.   Musculoskeletal: Positive for gait problem.  Skin: Negative.   Allergic/Immunologic: Negative.   Hematological: Negative.   Psychiatric/Behavioral: Positive for confusion.  All other systems reviewed and are negative.      Objective:   Physical Exam  Constitutional: She is oriented to person, place, and time. She appears well-developed and well-nourished.  HENT:  Head: Normocephalic and atraumatic.  Neck: Normal range of motion. Neck supple.  Cardiovascular: Normal rate and regular rhythm.  Pulmonary/Chest: Effort normal and breath sounds normal.  Musculoskeletal:  Normal Muscle Bulk and Muscle Testing Reveals:  Upper Extremities: Right: Full ROM and Muscle Strength 4/5 Left: Decreased ROM 90 Degrees and Muscle Strength 4/5 Lower Extremities: Right: Full ROM and Muscle Strength 5/5 Left: Decreased ROM and Muscle Strength 4/5 Arise from chair with ease using walker Gait is steady with normal steps   Neurological: She is alert and oriented to person, place, and time.  Skin: Skin is warm and dry.  Psychiatric: She has a normal mood and affect.  Nursing note and vitals reviewed.         Assessment & Plan:  1. Subarachoid Hemorrhage with Multiple Fractures: Continue with Home Health Therapies: Physical, Occupational and Speech Therapy.  2. Falls: Continue Home Health Therapies. 3. Cervical Fracture: Wearing Soft Neck Brace: Neurology Following Appointment.on 01/05/2018 4. SIADH: PCP Following: BMP was drawn and she was instructed to drink Gatorade and Take one  Na Tablet daily. She's scheduled for a BMP next week, daughter reports.  5. Type 2 DM: PCP Following:  6. Type 2 DM with Peripheral Neuropathy: Continue with Gabapentin: PCP Following 7. Hypertension: Continue current medication regime: PCP Following 8. Gout: Continue current medication Regime: PCP Following.  F/U in 1 month with Dr  Naaman Plummer  30 Minutes of face to face patient care time was spent during this visit. All questions were encouraged and answered.

## 2017-12-29 DIAGNOSIS — S12401D Unspecified nondisplaced fracture of fifth cervical vertebra, subsequent encounter for fracture with routine healing: Secondary | ICD-10-CM | POA: Diagnosis not present

## 2017-12-29 DIAGNOSIS — S0292XD Unspecified fracture of facial bones, subsequent encounter for fracture with routine healing: Secondary | ICD-10-CM | POA: Diagnosis not present

## 2017-12-29 DIAGNOSIS — S0242XD Fracture of alveolus of maxilla, subsequent encounter for fracture with routine healing: Secondary | ICD-10-CM | POA: Diagnosis not present

## 2017-12-29 DIAGNOSIS — S066X1D Traumatic subarachnoid hemorrhage with loss of consciousness of 30 minutes or less, subsequent encounter: Secondary | ICD-10-CM | POA: Diagnosis not present

## 2017-12-29 DIAGNOSIS — S12301D Unspecified nondisplaced fracture of fourth cervical vertebra, subsequent encounter for fracture with routine healing: Secondary | ICD-10-CM | POA: Diagnosis not present

## 2017-12-29 DIAGNOSIS — S032XXD Dislocation of tooth, subsequent encounter: Secondary | ICD-10-CM | POA: Diagnosis not present

## 2017-12-30 DIAGNOSIS — S066X1D Traumatic subarachnoid hemorrhage with loss of consciousness of 30 minutes or less, subsequent encounter: Secondary | ICD-10-CM | POA: Diagnosis not present

## 2017-12-30 DIAGNOSIS — S032XXD Dislocation of tooth, subsequent encounter: Secondary | ICD-10-CM | POA: Diagnosis not present

## 2017-12-30 DIAGNOSIS — S0292XD Unspecified fracture of facial bones, subsequent encounter for fracture with routine healing: Secondary | ICD-10-CM | POA: Diagnosis not present

## 2017-12-30 DIAGNOSIS — S0242XD Fracture of alveolus of maxilla, subsequent encounter for fracture with routine healing: Secondary | ICD-10-CM | POA: Diagnosis not present

## 2017-12-30 DIAGNOSIS — S12301D Unspecified nondisplaced fracture of fourth cervical vertebra, subsequent encounter for fracture with routine healing: Secondary | ICD-10-CM | POA: Diagnosis not present

## 2017-12-30 DIAGNOSIS — S12401D Unspecified nondisplaced fracture of fifth cervical vertebra, subsequent encounter for fracture with routine healing: Secondary | ICD-10-CM | POA: Diagnosis not present

## 2017-12-31 DIAGNOSIS — E871 Hypo-osmolality and hyponatremia: Secondary | ICD-10-CM | POA: Diagnosis not present

## 2017-12-31 DIAGNOSIS — E119 Type 2 diabetes mellitus without complications: Secondary | ICD-10-CM | POA: Diagnosis not present

## 2017-12-31 DIAGNOSIS — N183 Chronic kidney disease, stage 3 (moderate): Secondary | ICD-10-CM | POA: Diagnosis not present

## 2017-12-31 DIAGNOSIS — I129 Hypertensive chronic kidney disease with stage 1 through stage 4 chronic kidney disease, or unspecified chronic kidney disease: Secondary | ICD-10-CM | POA: Diagnosis not present

## 2018-01-01 DIAGNOSIS — S0292XD Unspecified fracture of facial bones, subsequent encounter for fracture with routine healing: Secondary | ICD-10-CM | POA: Diagnosis not present

## 2018-01-01 DIAGNOSIS — S032XXD Dislocation of tooth, subsequent encounter: Secondary | ICD-10-CM | POA: Diagnosis not present

## 2018-01-01 DIAGNOSIS — S12301D Unspecified nondisplaced fracture of fourth cervical vertebra, subsequent encounter for fracture with routine healing: Secondary | ICD-10-CM | POA: Diagnosis not present

## 2018-01-01 DIAGNOSIS — S066X1D Traumatic subarachnoid hemorrhage with loss of consciousness of 30 minutes or less, subsequent encounter: Secondary | ICD-10-CM | POA: Diagnosis not present

## 2018-01-01 DIAGNOSIS — S0242XD Fracture of alveolus of maxilla, subsequent encounter for fracture with routine healing: Secondary | ICD-10-CM | POA: Diagnosis not present

## 2018-01-01 DIAGNOSIS — S12401D Unspecified nondisplaced fracture of fifth cervical vertebra, subsequent encounter for fracture with routine healing: Secondary | ICD-10-CM | POA: Diagnosis not present

## 2018-01-05 DIAGNOSIS — S12391D Other nondisplaced fracture of fourth cervical vertebra, subsequent encounter for fracture with routine healing: Secondary | ICD-10-CM | POA: Diagnosis not present

## 2018-01-06 DIAGNOSIS — S12401D Unspecified nondisplaced fracture of fifth cervical vertebra, subsequent encounter for fracture with routine healing: Secondary | ICD-10-CM | POA: Diagnosis not present

## 2018-01-06 DIAGNOSIS — S12301D Unspecified nondisplaced fracture of fourth cervical vertebra, subsequent encounter for fracture with routine healing: Secondary | ICD-10-CM | POA: Diagnosis not present

## 2018-01-06 DIAGNOSIS — S0242XD Fracture of alveolus of maxilla, subsequent encounter for fracture with routine healing: Secondary | ICD-10-CM | POA: Diagnosis not present

## 2018-01-06 DIAGNOSIS — S0292XD Unspecified fracture of facial bones, subsequent encounter for fracture with routine healing: Secondary | ICD-10-CM | POA: Diagnosis not present

## 2018-01-06 DIAGNOSIS — S032XXD Dislocation of tooth, subsequent encounter: Secondary | ICD-10-CM | POA: Diagnosis not present

## 2018-01-06 DIAGNOSIS — S066X1D Traumatic subarachnoid hemorrhage with loss of consciousness of 30 minutes or less, subsequent encounter: Secondary | ICD-10-CM | POA: Diagnosis not present

## 2018-01-07 DIAGNOSIS — S12301D Unspecified nondisplaced fracture of fourth cervical vertebra, subsequent encounter for fracture with routine healing: Secondary | ICD-10-CM | POA: Diagnosis not present

## 2018-01-07 DIAGNOSIS — S0292XD Unspecified fracture of facial bones, subsequent encounter for fracture with routine healing: Secondary | ICD-10-CM | POA: Diagnosis not present

## 2018-01-07 DIAGNOSIS — S0242XD Fracture of alveolus of maxilla, subsequent encounter for fracture with routine healing: Secondary | ICD-10-CM | POA: Diagnosis not present

## 2018-01-07 DIAGNOSIS — S066X1D Traumatic subarachnoid hemorrhage with loss of consciousness of 30 minutes or less, subsequent encounter: Secondary | ICD-10-CM | POA: Diagnosis not present

## 2018-01-07 DIAGNOSIS — S12401D Unspecified nondisplaced fracture of fifth cervical vertebra, subsequent encounter for fracture with routine healing: Secondary | ICD-10-CM | POA: Diagnosis not present

## 2018-01-07 DIAGNOSIS — S032XXD Dislocation of tooth, subsequent encounter: Secondary | ICD-10-CM | POA: Diagnosis not present

## 2018-01-08 DIAGNOSIS — S12401D Unspecified nondisplaced fracture of fifth cervical vertebra, subsequent encounter for fracture with routine healing: Secondary | ICD-10-CM | POA: Diagnosis not present

## 2018-01-08 DIAGNOSIS — S12301D Unspecified nondisplaced fracture of fourth cervical vertebra, subsequent encounter for fracture with routine healing: Secondary | ICD-10-CM | POA: Diagnosis not present

## 2018-01-08 DIAGNOSIS — N183 Chronic kidney disease, stage 3 (moderate): Secondary | ICD-10-CM | POA: Diagnosis not present

## 2018-01-08 DIAGNOSIS — S066X1D Traumatic subarachnoid hemorrhage with loss of consciousness of 30 minutes or less, subsequent encounter: Secondary | ICD-10-CM | POA: Diagnosis not present

## 2018-01-08 DIAGNOSIS — S0242XD Fracture of alveolus of maxilla, subsequent encounter for fracture with routine healing: Secondary | ICD-10-CM | POA: Diagnosis not present

## 2018-01-08 DIAGNOSIS — S0292XD Unspecified fracture of facial bones, subsequent encounter for fracture with routine healing: Secondary | ICD-10-CM | POA: Diagnosis not present

## 2018-01-08 DIAGNOSIS — S032XXD Dislocation of tooth, subsequent encounter: Secondary | ICD-10-CM | POA: Diagnosis not present

## 2018-01-09 ENCOUNTER — Other Ambulatory Visit: Payer: Self-pay | Admitting: Physical Medicine and Rehabilitation

## 2018-01-11 DIAGNOSIS — R531 Weakness: Secondary | ICD-10-CM | POA: Diagnosis not present

## 2018-01-11 DIAGNOSIS — S06301S Unspecified focal traumatic brain injury with loss of consciousness of 30 minutes or less, sequela: Secondary | ICD-10-CM | POA: Diagnosis not present

## 2018-01-12 DIAGNOSIS — S0292XD Unspecified fracture of facial bones, subsequent encounter for fracture with routine healing: Secondary | ICD-10-CM | POA: Diagnosis not present

## 2018-01-12 DIAGNOSIS — S0242XD Fracture of alveolus of maxilla, subsequent encounter for fracture with routine healing: Secondary | ICD-10-CM | POA: Diagnosis not present

## 2018-01-12 DIAGNOSIS — S032XXD Dislocation of tooth, subsequent encounter: Secondary | ICD-10-CM | POA: Diagnosis not present

## 2018-01-12 DIAGNOSIS — S12301D Unspecified nondisplaced fracture of fourth cervical vertebra, subsequent encounter for fracture with routine healing: Secondary | ICD-10-CM | POA: Diagnosis not present

## 2018-01-12 DIAGNOSIS — S066X1D Traumatic subarachnoid hemorrhage with loss of consciousness of 30 minutes or less, subsequent encounter: Secondary | ICD-10-CM | POA: Diagnosis not present

## 2018-01-12 DIAGNOSIS — S12401D Unspecified nondisplaced fracture of fifth cervical vertebra, subsequent encounter for fracture with routine healing: Secondary | ICD-10-CM | POA: Diagnosis not present

## 2018-01-15 DIAGNOSIS — S12401D Unspecified nondisplaced fracture of fifth cervical vertebra, subsequent encounter for fracture with routine healing: Secondary | ICD-10-CM | POA: Diagnosis not present

## 2018-01-15 DIAGNOSIS — S12301D Unspecified nondisplaced fracture of fourth cervical vertebra, subsequent encounter for fracture with routine healing: Secondary | ICD-10-CM | POA: Diagnosis not present

## 2018-01-15 DIAGNOSIS — S0242XD Fracture of alveolus of maxilla, subsequent encounter for fracture with routine healing: Secondary | ICD-10-CM | POA: Diagnosis not present

## 2018-01-15 DIAGNOSIS — S032XXD Dislocation of tooth, subsequent encounter: Secondary | ICD-10-CM | POA: Diagnosis not present

## 2018-01-15 DIAGNOSIS — S0292XD Unspecified fracture of facial bones, subsequent encounter for fracture with routine healing: Secondary | ICD-10-CM | POA: Diagnosis not present

## 2018-01-15 DIAGNOSIS — S066X1D Traumatic subarachnoid hemorrhage with loss of consciousness of 30 minutes or less, subsequent encounter: Secondary | ICD-10-CM | POA: Diagnosis not present

## 2018-01-19 DIAGNOSIS — S12401D Unspecified nondisplaced fracture of fifth cervical vertebra, subsequent encounter for fracture with routine healing: Secondary | ICD-10-CM | POA: Diagnosis not present

## 2018-01-19 DIAGNOSIS — S12301D Unspecified nondisplaced fracture of fourth cervical vertebra, subsequent encounter for fracture with routine healing: Secondary | ICD-10-CM | POA: Diagnosis not present

## 2018-01-19 DIAGNOSIS — S032XXD Dislocation of tooth, subsequent encounter: Secondary | ICD-10-CM | POA: Diagnosis not present

## 2018-01-19 DIAGNOSIS — S0292XD Unspecified fracture of facial bones, subsequent encounter for fracture with routine healing: Secondary | ICD-10-CM | POA: Diagnosis not present

## 2018-01-19 DIAGNOSIS — S0242XD Fracture of alveolus of maxilla, subsequent encounter for fracture with routine healing: Secondary | ICD-10-CM | POA: Diagnosis not present

## 2018-01-19 DIAGNOSIS — S066X1D Traumatic subarachnoid hemorrhage with loss of consciousness of 30 minutes or less, subsequent encounter: Secondary | ICD-10-CM | POA: Diagnosis not present

## 2018-01-20 ENCOUNTER — Encounter: Payer: Medicare HMO | Admitting: Physical Medicine & Rehabilitation

## 2018-01-21 DIAGNOSIS — S066X1D Traumatic subarachnoid hemorrhage with loss of consciousness of 30 minutes or less, subsequent encounter: Secondary | ICD-10-CM | POA: Diagnosis not present

## 2018-01-21 DIAGNOSIS — S12401D Unspecified nondisplaced fracture of fifth cervical vertebra, subsequent encounter for fracture with routine healing: Secondary | ICD-10-CM | POA: Diagnosis not present

## 2018-01-21 DIAGNOSIS — S032XXD Dislocation of tooth, subsequent encounter: Secondary | ICD-10-CM | POA: Diagnosis not present

## 2018-01-21 DIAGNOSIS — S12301D Unspecified nondisplaced fracture of fourth cervical vertebra, subsequent encounter for fracture with routine healing: Secondary | ICD-10-CM | POA: Diagnosis not present

## 2018-01-21 DIAGNOSIS — S0242XD Fracture of alveolus of maxilla, subsequent encounter for fracture with routine healing: Secondary | ICD-10-CM | POA: Diagnosis not present

## 2018-01-21 DIAGNOSIS — S0292XD Unspecified fracture of facial bones, subsequent encounter for fracture with routine healing: Secondary | ICD-10-CM | POA: Diagnosis not present

## 2018-01-22 DIAGNOSIS — S0242XD Fracture of alveolus of maxilla, subsequent encounter for fracture with routine healing: Secondary | ICD-10-CM | POA: Diagnosis not present

## 2018-01-22 DIAGNOSIS — S032XXD Dislocation of tooth, subsequent encounter: Secondary | ICD-10-CM | POA: Diagnosis not present

## 2018-01-22 DIAGNOSIS — S066X1D Traumatic subarachnoid hemorrhage with loss of consciousness of 30 minutes or less, subsequent encounter: Secondary | ICD-10-CM | POA: Diagnosis not present

## 2018-01-22 DIAGNOSIS — S0292XD Unspecified fracture of facial bones, subsequent encounter for fracture with routine healing: Secondary | ICD-10-CM | POA: Diagnosis not present

## 2018-01-22 DIAGNOSIS — S12401D Unspecified nondisplaced fracture of fifth cervical vertebra, subsequent encounter for fracture with routine healing: Secondary | ICD-10-CM | POA: Diagnosis not present

## 2018-01-22 DIAGNOSIS — S12301D Unspecified nondisplaced fracture of fourth cervical vertebra, subsequent encounter for fracture with routine healing: Secondary | ICD-10-CM | POA: Diagnosis not present

## 2018-01-23 DIAGNOSIS — S12401D Unspecified nondisplaced fracture of fifth cervical vertebra, subsequent encounter for fracture with routine healing: Secondary | ICD-10-CM | POA: Diagnosis not present

## 2018-01-23 DIAGNOSIS — S066X1D Traumatic subarachnoid hemorrhage with loss of consciousness of 30 minutes or less, subsequent encounter: Secondary | ICD-10-CM | POA: Diagnosis not present

## 2018-01-23 DIAGNOSIS — S12301D Unspecified nondisplaced fracture of fourth cervical vertebra, subsequent encounter for fracture with routine healing: Secondary | ICD-10-CM | POA: Diagnosis not present

## 2018-01-23 DIAGNOSIS — S0242XD Fracture of alveolus of maxilla, subsequent encounter for fracture with routine healing: Secondary | ICD-10-CM | POA: Diagnosis not present

## 2018-01-23 DIAGNOSIS — S032XXD Dislocation of tooth, subsequent encounter: Secondary | ICD-10-CM | POA: Diagnosis not present

## 2018-01-23 DIAGNOSIS — S0292XD Unspecified fracture of facial bones, subsequent encounter for fracture with routine healing: Secondary | ICD-10-CM | POA: Diagnosis not present

## 2018-02-02 ENCOUNTER — Encounter: Payer: Self-pay | Admitting: Physical Medicine & Rehabilitation

## 2018-02-02 ENCOUNTER — Encounter: Payer: Medicare HMO | Attending: Registered Nurse | Admitting: Physical Medicine & Rehabilitation

## 2018-02-02 VITALS — BP 165/72 | HR 61 | Ht 60.0 in | Wt 134.0 lb

## 2018-02-02 DIAGNOSIS — I609 Nontraumatic subarachnoid hemorrhage, unspecified: Secondary | ICD-10-CM | POA: Diagnosis not present

## 2018-02-02 DIAGNOSIS — S129XXA Fracture of neck, unspecified, initial encounter: Secondary | ICD-10-CM | POA: Insufficient documentation

## 2018-02-02 DIAGNOSIS — Z853 Personal history of malignant neoplasm of breast: Secondary | ICD-10-CM | POA: Insufficient documentation

## 2018-02-02 DIAGNOSIS — Z87891 Personal history of nicotine dependence: Secondary | ICD-10-CM | POA: Diagnosis not present

## 2018-02-02 DIAGNOSIS — E119 Type 2 diabetes mellitus without complications: Secondary | ICD-10-CM | POA: Insufficient documentation

## 2018-02-02 DIAGNOSIS — M109 Gout, unspecified: Secondary | ICD-10-CM | POA: Diagnosis not present

## 2018-02-02 DIAGNOSIS — I10 Essential (primary) hypertension: Secondary | ICD-10-CM | POA: Diagnosis not present

## 2018-02-02 DIAGNOSIS — W19XXXD Unspecified fall, subsequent encounter: Secondary | ICD-10-CM

## 2018-02-02 DIAGNOSIS — I2510 Atherosclerotic heart disease of native coronary artery without angina pectoris: Secondary | ICD-10-CM | POA: Insufficient documentation

## 2018-02-02 DIAGNOSIS — E1142 Type 2 diabetes mellitus with diabetic polyneuropathy: Secondary | ICD-10-CM | POA: Insufficient documentation

## 2018-02-02 DIAGNOSIS — Z9012 Acquired absence of left breast and nipple: Secondary | ICD-10-CM | POA: Insufficient documentation

## 2018-02-02 DIAGNOSIS — Z90710 Acquired absence of both cervix and uterus: Secondary | ICD-10-CM | POA: Diagnosis not present

## 2018-02-02 DIAGNOSIS — Z9889 Other specified postprocedural states: Secondary | ICD-10-CM | POA: Diagnosis not present

## 2018-02-02 DIAGNOSIS — E222 Syndrome of inappropriate secretion of antidiuretic hormone: Secondary | ICD-10-CM | POA: Insufficient documentation

## 2018-02-02 NOTE — Progress Notes (Signed)
Subjective:    Patient ID: Sandy Owens, female    DOB: 1924-05-11, 84 y.o.   MRN: [id number]  HPI   Sandy Owens is here in follow up her [place]. She has been involved in [facility] therapies which released her a couple weeks ago. She is using cane outside of the house but not in. Her pain is minimal.   Her biggest issue is her short term memory which has been affected by the injury. She is relying often on others to help supplements. She does use a calendar at home to help with appointments. It sits on her dining room table.   Her appetite is reasonable although she's never "hungry".    Pain Inventory Average Pain 0 Pain Right Now 0 My pain is na  In the last 24 hours, has pain interfered with the following? General activity 0 Relation with others 0 Enjoyment of life 0 What TIME of day is your pain at its worst? na Sleep (in general) Good  Pain is worse with: na Pain improves with: na Relief from Meds: na  Mobility use a cane ability to climb steps?  no do you drive?  no  Function retired  Neuro/Psych confusion  Prior Studies Any changes since last visit?  no  Physicians involved in your care Any changes since last visit?  no   Family History  Problem Relation Age of Onset  . Breast cancer Neg Hx    Social History   Socioeconomic History  . Marital status: Widowed    Spouse name: Not on file  . Number of children: Not on file  . Years of education: Not on file  . Highest education level: Not on file  Occupational History  . Not on file  Social Needs  . Financial resource strain: Not on file  . Food insecurity:    Worry: Not on file    Inability: Not on file  . Transportation needs:    Medical: Not on file    Non-medical: Not on file  Tobacco Use  . Smoking status: Former Research scientist (life sciences)  . Smokeless tobacco: Never Used  . Tobacco comment: quit 40 years ago   Substance and Sexual Activity  . Alcohol use: Not Currently    Comment: occasional - drinks  wine  . Drug use: No  . Sexual activity: Not on file  Lifestyle  . Physical activity:    Days per week: Not on file    Minutes per session: Not on file  . Stress: Not on file  Relationships  . Social connections:    Talks on phone: Not on file    Gets together: Not on file    Attends religious service: Not on file    Active member of club or organization: Not on file    Attends meetings of clubs or organizations: Not on file    Relationship status: Not on file  Other Topics Concern  . Not on file  Social History Narrative  . Not on file   Past Surgical History:  Procedure Laterality Date  . ABDOMINAL HYSTERECTOMY    . APPENDECTOMY    . BACK SURGERY    . cabg    . HIP SURGERY      right hip replacement  . JOINT REPLACEMENT     left knee replacement  . MASTECTOMY Left   . MULTIPLE EXTRACTIONS WITH ALVEOLOPLASTY N/A 11/28/2017   Procedure: MULTIPLE EXTRACTION WITH ALVEOLOPLASTY;  Surgeon: Diona Browner, DDS;  Location: [place];  Service: Oral Surgery;  Laterality: N/A;  . SIMPLE MASTECTOMY WITH AXILLARY SENTINEL NODE BIOPSY Left 06/06/2014   Procedure: LEFT SIMPLE MASTECTOMY;  Surgeon: Excell Seltzer, MD;  Location: [place];  Service: General;  Laterality: Left;   Past Medical History:  Diagnosis Date  . Cancer [facility])    left breast  . Coronary artery disease    CABG - 3- 2001  . Diabetes mellitus without complication ([place])   . Hypertension   . Hypothyroidism   . Neuromuscular disorder ([place])    neuropathy in feet  and legs   There were no vitals taken for this visit.  Opioid Risk Score:   Fall Risk Score:  `1  Depression screen PHQ 2/9  No flowsheet data found.   Review of Systems  Constitutional: Negative.   HENT: Negative.   Eyes: Negative.   Respiratory: Negative.   Cardiovascular: Negative.   Gastrointestinal: Negative.   Endocrine: Negative.   Genitourinary: Negative.   Musculoskeletal: Positive for gait problem.  Skin: Negative.    Allergic/Immunologic: Negative.   Hematological: Negative.   Psychiatric/Behavioral: Positive for confusion.  All other systems reviewed and are negative.      Objective:   Physical Exam  General patient alert no distress Head neck: Neck is supple Cardiac: Rate is regular Chest/breathing: Normal effort Neurological: Patient is alert and oriented x3.  Strength is 5 out of 5 essentially throughout.  She transfers easily with some extra time.  She uses a cane for balance and gait is steady with it. Without she is a bit more choppy.  Reasonable  insight and awareness. STM deficits Skin: Warm and dry Musc: left shoulder lacks 15 degrees abd.  Psychiatry: Patient's pleasant and cooperative   Assessment and plan: 1.  Subarachnoid hemorrhage with multiple fractures.     -recommend use of cane for gait  -discussed use of a daily organizer to help supplement her memory  -continue with HH. Don't believe outpt therapy is required 2.  History of SIADH.  Has essentially resolved. 3.  Gout: Continue regimen.  No acute flares. 4.  Type 2 diabetes with peripheral neuropathy: Continue gabapentin.  Pain appears under control at present 5. Vision: recommend eye follow up in the next 2-3 months.      15 minutes of direct patient time was spent today at this visit.  All questions were encouraged and answered.  I will follow-up with the patient

## 2018-02-02 NOTE — Patient Instructions (Signed)
1. USE YOUR CANE MORE OFTEN THAN NOT  2. EYE FOLLOW UP OVER NEXT 2-3 MONTHS.   3. WORK ON YOUR RANGE OF MOTION EXERCISES EVERY DAY!    PLEASE FEEL FREE TO CALL OUR OFFICE WITH ANY PROBLEMS OR QUESTIONS ([phone])

## 2018-02-11 DIAGNOSIS — R531 Weakness: Secondary | ICD-10-CM | POA: Diagnosis not present

## 2018-02-11 DIAGNOSIS — S06301S Unspecified focal traumatic brain injury with loss of consciousness of 30 minutes or less, sequela: Secondary | ICD-10-CM | POA: Diagnosis not present

## 2018-02-13 ENCOUNTER — Ambulatory Visit: Payer: Medicare HMO | Admitting: Podiatry

## 2018-02-13 ENCOUNTER — Encounter: Payer: Self-pay | Admitting: Podiatry

## 2018-02-13 VITALS — BP 150/72 | HR 67

## 2018-02-13 DIAGNOSIS — B351 Tinea unguium: Secondary | ICD-10-CM | POA: Diagnosis not present

## 2018-02-13 DIAGNOSIS — M79674 Pain in right toe(s): Secondary | ICD-10-CM

## 2018-02-13 DIAGNOSIS — M79675 Pain in left toe(s): Secondary | ICD-10-CM

## 2018-02-13 DIAGNOSIS — E1142 Type 2 diabetes mellitus with diabetic polyneuropathy: Secondary | ICD-10-CM | POA: Diagnosis not present

## 2018-02-13 NOTE — Progress Notes (Signed)
This patient presents to the office with chief complaint of long thick nails and diabetic feet.  This patient  says there  is  no pain and discomfort in their feet.  This patient says there are long thick painful nails.  These nails are painful walking and wearing shoes.  Patient has no history of infection or drainage from both feet.  Patient is unable to  self treat his own nails . This patient presents  to the office today for treatment of the  long nails and a foot evaluation due to history of  diabetes.  General Appearance  Alert, conversant and in no acute stress.  Vascular  Dorsalis pedis and posterior tibial  pulses are weakly  palpable  bilaterally.  Capillary return is within normal limits  bilaterally. Temperature is within normal limits  bilaterally.  Neurologic  Senn-Weinstein monofilament wire test diminished/absent  B/L.Marland Kitchen Muscle power within normal limits bilaterally.  Nails Thick disfigured discolored nails with subungual debris  from hallux to fifth toes bilaterally. No evidence of bacterial infection or drainage bilaterally.  Orthopedic  No limitations of motion of motion feet .  No crepitus or effusions noted.  No bony pathology or digital deformities noted.  Skin  normotropic skin with no porokeratosis noted bilaterally.  No signs of infections or ulcers noted.     Onychomycosis  Diabetes with no foot complications  IE  Debride nails x 10.  A diabetic foot exam was performed and there is evidence of vascular and neurologic pathology.   RTC 3 months.   Gardiner Barefoot DPM

## 2018-02-16 DIAGNOSIS — E782 Mixed hyperlipidemia: Secondary | ICD-10-CM | POA: Diagnosis not present

## 2018-02-16 DIAGNOSIS — E119 Type 2 diabetes mellitus without complications: Secondary | ICD-10-CM | POA: Diagnosis not present

## 2018-02-16 DIAGNOSIS — N183 Chronic kidney disease, stage 3 (moderate): Secondary | ICD-10-CM | POA: Diagnosis not present

## 2018-02-16 DIAGNOSIS — R300 Dysuria: Secondary | ICD-10-CM | POA: Diagnosis not present

## 2018-02-23 DIAGNOSIS — I2510 Atherosclerotic heart disease of native coronary artery without angina pectoris: Secondary | ICD-10-CM | POA: Diagnosis not present

## 2018-02-23 DIAGNOSIS — N183 Chronic kidney disease, stage 3 (moderate): Secondary | ICD-10-CM | POA: Diagnosis not present

## 2018-02-23 DIAGNOSIS — I129 Hypertensive chronic kidney disease with stage 1 through stage 4 chronic kidney disease, or unspecified chronic kidney disease: Secondary | ICD-10-CM | POA: Diagnosis not present

## 2018-02-23 DIAGNOSIS — E782 Mixed hyperlipidemia: Secondary | ICD-10-CM | POA: Diagnosis not present

## 2018-02-23 DIAGNOSIS — E119 Type 2 diabetes mellitus without complications: Secondary | ICD-10-CM | POA: Diagnosis not present

## 2018-02-23 DIAGNOSIS — R413 Other amnesia: Secondary | ICD-10-CM | POA: Diagnosis not present

## 2018-03-03 DIAGNOSIS — R410 Disorientation, unspecified: Secondary | ICD-10-CM | POA: Diagnosis not present

## 2018-03-13 DIAGNOSIS — R531 Weakness: Secondary | ICD-10-CM | POA: Diagnosis not present

## 2018-03-13 DIAGNOSIS — S06301S Unspecified focal traumatic brain injury with loss of consciousness of 30 minutes or less, sequela: Secondary | ICD-10-CM | POA: Diagnosis not present

## 2018-03-13 LAB — URINALYSIS, MICROSCOPIC (REFLEX)

## 2018-03-25 DIAGNOSIS — M1A09X1 Idiopathic chronic gout, multiple sites, with tophus (tophi): Secondary | ICD-10-CM | POA: Diagnosis not present

## 2018-03-25 DIAGNOSIS — R441 Visual hallucinations: Secondary | ICD-10-CM | POA: Diagnosis not present

## 2018-03-25 DIAGNOSIS — M5442 Lumbago with sciatica, left side: Secondary | ICD-10-CM | POA: Diagnosis not present

## 2018-03-25 DIAGNOSIS — F0390 Unspecified dementia without behavioral disturbance: Secondary | ICD-10-CM | POA: Diagnosis not present

## 2018-03-25 DIAGNOSIS — I129 Hypertensive chronic kidney disease with stage 1 through stage 4 chronic kidney disease, or unspecified chronic kidney disease: Secondary | ICD-10-CM | POA: Diagnosis not present

## 2018-03-25 DIAGNOSIS — E119 Type 2 diabetes mellitus without complications: Secondary | ICD-10-CM | POA: Diagnosis not present

## 2018-03-26 DIAGNOSIS — E039 Hypothyroidism, unspecified: Secondary | ICD-10-CM | POA: Diagnosis not present

## 2018-03-26 DIAGNOSIS — E119 Type 2 diabetes mellitus without complications: Secondary | ICD-10-CM | POA: Diagnosis not present

## 2018-03-26 DIAGNOSIS — E782 Mixed hyperlipidemia: Secondary | ICD-10-CM | POA: Diagnosis not present

## 2018-03-26 DIAGNOSIS — I129 Hypertensive chronic kidney disease with stage 1 through stage 4 chronic kidney disease, or unspecified chronic kidney disease: Secondary | ICD-10-CM | POA: Diagnosis not present

## 2018-04-13 DIAGNOSIS — R531 Weakness: Secondary | ICD-10-CM | POA: Diagnosis not present

## 2018-04-13 DIAGNOSIS — S06301S Unspecified focal traumatic brain injury with loss of consciousness of 30 minutes or less, sequela: Secondary | ICD-10-CM | POA: Diagnosis not present

## 2018-05-06 ENCOUNTER — Other Ambulatory Visit: Payer: Self-pay | Admitting: Dermatology

## 2018-05-06 DIAGNOSIS — L570 Actinic keratosis: Secondary | ICD-10-CM | POA: Diagnosis not present

## 2018-05-06 DIAGNOSIS — C4442 Squamous cell carcinoma of skin of scalp and neck: Secondary | ICD-10-CM | POA: Diagnosis not present

## 2018-05-14 DIAGNOSIS — S06301S Unspecified focal traumatic brain injury with loss of consciousness of 30 minutes or less, sequela: Secondary | ICD-10-CM | POA: Diagnosis not present

## 2018-05-14 DIAGNOSIS — R531 Weakness: Secondary | ICD-10-CM | POA: Diagnosis not present

## 2018-05-20 ENCOUNTER — Other Ambulatory Visit: Payer: Self-pay

## 2018-05-20 ENCOUNTER — Telehealth: Payer: Self-pay | Admitting: Neurology

## 2018-05-20 ENCOUNTER — Ambulatory Visit: Payer: Medicare HMO | Admitting: Neurology

## 2018-05-20 ENCOUNTER — Encounter: Payer: Self-pay | Admitting: Neurology

## 2018-05-20 VITALS — BP 161/65 | HR 61 | Ht 60.0 in | Wt 138.5 lb

## 2018-05-20 DIAGNOSIS — R413 Other amnesia: Secondary | ICD-10-CM | POA: Diagnosis not present

## 2018-05-20 DIAGNOSIS — E538 Deficiency of other specified B group vitamins: Secondary | ICD-10-CM

## 2018-05-20 DIAGNOSIS — I6521 Occlusion and stenosis of right carotid artery: Secondary | ICD-10-CM | POA: Diagnosis not present

## 2018-05-20 NOTE — Telephone Encounter (Signed)
Sandy Owens: [id number] (exp. 05/20/18 to 06/19/18) order sent to GI. They will reach out to the pt to schedule.

## 2018-05-20 NOTE — Progress Notes (Signed)
Reason for visit: Memory disturbance, visual hallucinations  Referring physician: Dr. Lonna Duval is a 84 y.o. female  History of present illness:  Sandy Owens is a 84 year old right-handed white female with a history of a fall that occurred on 26 November 2017.  The patient hit her head and sustained a concussion, she had subarachnoid bleeding mainly posteriorly in the head.  The patient also sustained a cervical fracture involving the C4 and C5 spinous process.  There is some question of right carotid artery disease noted as well.  The patient comes in with her daughter today.  The patient is still living alone at this time.  The patient has some visual impairment, she is unable to see her pills well and she cannot read a newspaper.  The patient has begun having some visual hallucinations mainly in the evenings shortly after she started low-dose Aricept 5 mg at night.  Most of the hallucinations are of people inside the house, the hallucinations are not threatening.  The patient had not had any significant problems with memory prior to the fall, her daughter indicates that after the fall the memory has been an issue.  The patient also has developed some increased problems with gait instability, she has had one other fall since her hospitalization.  The patient now uses a cane or a walker for ambulation, she did not require this previously.  The patient does require some assistance now keeping up with medications and appointments.  Most of her financial transactions are done automatically online.  The patient reports some generalized fatigue, she has no focal weakness or numbness of the extremities.  She reports a history of a peripheral neuropathy associated with her diabetes.  The patient does have a urinary frequency and urgency, this was present prior to the fall.  The patient does have some neck pain since the fall.  She is sent to this office for an evaluation.  Past Medical  History:  Diagnosis Date  . Cancer [facility])    left breast  . Coronary artery disease    CABG - 3- 2001  . Diabetes mellitus without complication ([place])   . Hypertension   . Hypothyroidism   . Neuromuscular disorder ([place])    neuropathy in feet  and legs    Past Surgical History:  Procedure Laterality Date  . ABDOMINAL HYSTERECTOMY    . APPENDECTOMY    . BACK SURGERY    . cabg    . HIP SURGERY      right hip replacement  . JOINT REPLACEMENT     left knee replacement  . MASTECTOMY Left   . MULTIPLE EXTRACTIONS WITH ALVEOLOPLASTY N/A 11/28/2017   Procedure: MULTIPLE EXTRACTION WITH ALVEOLOPLASTY;  Surgeon: Diona Browner, DDS;  Location: [place];  Service: Oral Surgery;  Laterality: N/A;  . SIMPLE MASTECTOMY WITH AXILLARY SENTINEL NODE BIOPSY Left 06/06/2014   Procedure: LEFT SIMPLE MASTECTOMY;  Surgeon: Excell Seltzer, MD;  Location: [place];  Service: General;  Laterality: Left;    Family History  Problem Relation Age of Onset  . Breast cancer Neg Hx     Social history:  reports that she has quit smoking. She has never used smokeless tobacco. She reports that she drinks alcohol. She reports that she does not use drugs.  Medications:  Prior to Admission medications   Medication Sig Start Date End Date Taking? Authorizing Provider  amLODipine (NORVASC) 5 MG tablet TAKE 1 TABLET(5 MG) BY MOUTH DAILY 01/09/18  Jamse Arn, MD  Artificial Tear Ointment (ARTIFICIAL TEARS) ointment Place 1 application into both eyes as needed (dry eyes).    [provider]  aspirin 81 MG tablet Take 81 mg by mouth daily.    [provider]  atenolol (TENORMIN) 50 MG tablet TAKE 1 TABLET(50 MG) BY MOUTH TWICE DAILY 01/09/18   Jamse Arn, MD  Calcium Carbonate-Vit D-Min [facility] CALCIUM PLUS 600 +D PO) Take 1 tablet by mouth.    [provider]  carboxymethylcellulose (REFRESH PLUS) 0.5 % SOLN Apply 1 drop to eye 3 (three) times daily as needed (dry  eyes).    [provider]  CINNAMON PO Take 2,000 mg by mouth daily.    [provider]  colchicine 0.6 MG tablet Take 0.5 tablets (0.3 mg total) by mouth daily. 12/12/17   Love, Ivan Anchors, PA-C  Cyanocobalamin (VITAMIN B12 PO) Take by mouth.    [provider]  gabapentin (NEURONTIN) 100 MG capsule TAKE 1 CAPSULE(100 MG) BY MOUTH TWICE DAILY 01/09/18   Patel, Domenick Bookbinder, MD  glimepiride (AMARYL) 1 MG tablet Take 0.5 mg by mouth daily with breakfast.    [provider]  levothyroxine (SYNTHROID, LEVOTHROID) 88 MCG tablet TAKE 1 TABLET(88 MCG) BY MOUTH DAILY 01/09/18   Jamse Arn, MD  lisinopril (PRINIVIL,ZESTRIL) 5 MG tablet TAKE 1 TABLET(5 MG) BY MOUTH DAILY 01/09/18   Jamse Arn, MD  Multiple Vitamin (MULTIVITAMIN) tablet Take 1 tablet by mouth daily.    [provider]  Multiple Vitamins-Minerals (PRESERVISION AREDS 2 PO) Take 2 tablets by mouth 2 (two) times daily.    [provider]  Omega-3 Fatty Acids (FISH OIL PO) Take 1 tablet by mouth 2 (two) times daily.    [provider]  sodium chloride 1 g tablet Take 1 tablet (1 g total) by mouth 2 (two) times daily with a meal. Patient not taking: Reported on 02/13/2018 12/12/17   Love, Ivan Anchors, PA-C     No Known Allergies  ROS:  Out of a complete 14 system review of symptoms, the patient complains only of the following symptoms, and all other reviewed systems are negative.  Fatigue Loss of vision Cough Constipation Itching Urinary incontinence Joint pain, aching muscles Runny nose Memory loss, confusion, weakness Decreased energy, disinterest in activities, hallucinations  Blood pressure (!) 161/65, pulse 61, height 5' (1.524 m), weight 138 lb 8 oz (62.8 kg), SpO2 95 %.  Physical Exam  General: The patient is alert and cooperative at the time of the examination.  Eyes: Pupils are equal, round, and reactive to light. Discs are flat bilaterally.  Neck: The  neck is supple, no carotid bruits are noted.  Respiratory: The respiratory examination is clear.  Cardiovascular: The cardiovascular examination reveals a regular rate and rhythm, no obvious murmurs or rubs are noted.  Skin: Extremities are without significant edema.  Neurologic Exam  Mental status: The patient is alert and oriented x 3 at the time of the examination. The Mini-Mental status examination done today shows a total score of 27/30.  Cranial nerves: Facial symmetry is present. There is good sensation of the face to pinprick and soft touch bilaterally. The strength of the facial muscles and the muscles to head turning and shoulder shrug are normal bilaterally. Speech is well enunciated, no aphasia or dysarthria is noted. Extraocular movements are full. Visual fields are full. The tongue is midline, and the patient has symmetric elevation of the soft palate. No obvious  hearing deficits are noted.  Motor: The motor testing reveals 5 over 5 strength of all 4 extremities. Good symmetric motor tone is noted throughout.  Sensory: Sensory testing is intact to pinprick, soft touch, vibration sensation, and position sense on all 4 extremities, with exception of decreased position sense in the right foot and decreased vibration sensation in both the. No evidence of extinction is noted.  Coordination: Cerebellar testing reveals good finger-nose-finger and heel-to-shin bilaterally.  Gait and station: Gait is slightly wide-based, shuffling in nature.  Tandem gait was not attempted.  Romberg is negative.  Reflexes: Deep tendon reflexes are symmetric, but are depressed bilaterally. Toes are downgoing bilaterally.   CT head 11/27/17:  IMPRESSION: 1. Stable moderate volume of subarachnoid hemorrhage. 2. No new acute intracranial abnormality identified. 3. Stable partially visualized hematoma of right facial soft tissues. 4. Stable sinus fluid levels, probably hemorrhage in the setting  of Trauma.  * CT scan images were reviewed online. I agree with the written report.    Assessment/Plan:  1.  Memory disturbance  2.  Gait disturbance  3.  Traumatic brain injury, concussion, subarachnoid hemorrhage  4.  Possible right carotid artery stenosis  The patient has sustained some changes in her balance and her memory since a fall in March 2019.  The patient will be set up for CT scan of the brain, we need to exclude developing normal pressure hydrocephalus.  The patient is having visual hallucinations but they are not threatening, I would not treat these events.  The visual hallucinations are likely a result of a combination of the cognitive changes as well as the visual impairment.  The patient will remain on Aricept at this time.  The patient will be set up for a carotid Doppler study.  Blood work will be done today, she will follow-up in 6 months.  Jill Alexanders MD 05/20/2018 8:36 AM  [facility] [address] [place], [place] [address]  Phone [phone] Fax [phone]

## 2018-05-21 ENCOUNTER — Telehealth: Payer: Self-pay

## 2018-05-21 LAB — VITAMIN B12: Vitamin B-12: >2000 pg/mL — ABNORMAL HIGH (ref 232–1245)

## 2018-05-21 LAB — SEDIMENTATION RATE: Sed Rate: 17 mm/hr (ref 0–40)

## 2018-05-21 NOTE — Telephone Encounter (Signed)
-----   Message from Hope Pigeon, RN sent at 05/21/2018  8:04 AM EDT -----   ----- Message ----- From: Kathrynn Ducking, MD Sent: 05/21/2018   7:24 AM EDT To: Hope Pigeon, RN   The blood work results are unremarkable. Please call the patient.  ----- Message ----- From: Lavone Neri Lab Results In Sent: 05/21/2018   5:40 AM EDT To: Kathrynn Ducking, MD

## 2018-05-21 NOTE — Telephone Encounter (Signed)
Left a detailed message letting her know that her B-12 level was normal. I left her the office number in case she has any further questions.

## 2018-05-22 ENCOUNTER — Encounter: Payer: Self-pay | Admitting: Podiatry

## 2018-05-22 ENCOUNTER — Ambulatory Visit: Payer: Medicare HMO | Admitting: Podiatry

## 2018-05-22 DIAGNOSIS — B351 Tinea unguium: Secondary | ICD-10-CM | POA: Diagnosis not present

## 2018-05-22 DIAGNOSIS — M79674 Pain in right toe(s): Secondary | ICD-10-CM

## 2018-05-22 DIAGNOSIS — E1142 Type 2 diabetes mellitus with diabetic polyneuropathy: Secondary | ICD-10-CM

## 2018-05-22 DIAGNOSIS — M79675 Pain in left toe(s): Secondary | ICD-10-CM | POA: Diagnosis not present

## 2018-05-22 NOTE — Progress Notes (Addendum)
Complaint:  Visit Type: Patient returns to my office for continued preventative foot care services. Complaint: Patient states" my nails have grown long and thick and become painful to walk and wear shoes" Patient has been diagnosed with DM with no foot complications. The patient presents for preventative foot care services. No changes to ROS  Podiatric Exam: Vascular: dorsalis pedis and posterior tibial pulses are weakly  palpable bilateral. Capillary return is immediate. Temperature gradient is WNL. Skin turgor WNL  Sensorium: Diminished/Absent Semmes Weinstein monofilament test. Normal tactile sensation bilaterally. Nail Exam: Pt has thick disfigured discolored nails with subungual debris noted bilateral entire nail hallux through fifth toenails Ulcer Exam: There is no evidence of ulcer or pre-ulcerative changes or infection. Orthopedic Exam: Muscle tone and strength are WNL. No limitations in general ROM. No crepitus or effusions noted. Foot type and digits show no abnormalities. Bony prominences are unremarkable. Skin: No Porokeratosis. No infection or ulcers  Diagnosis:  Onychomycosis, , Pain in right toe, pain in left toes  Treatment & Plan Procedures and Treatment: Consent by patient was obtained for treatment procedures.   Debridement of mycotic and hypertrophic toenails, 1 through 5 bilateral and clearing of subungual debris. No ulceration, no infection noted.  Return Visit-Office Procedure: Patient instructed to return to the office for a follow up visit 4 months for continued evaluation and treatment.    Gardiner Barefoot DPM

## 2018-05-27 DIAGNOSIS — E119 Type 2 diabetes mellitus without complications: Secondary | ICD-10-CM | POA: Diagnosis not present

## 2018-05-27 DIAGNOSIS — I10 Essential (primary) hypertension: Secondary | ICD-10-CM | POA: Diagnosis not present

## 2018-05-27 DIAGNOSIS — I129 Hypertensive chronic kidney disease with stage 1 through stage 4 chronic kidney disease, or unspecified chronic kidney disease: Secondary | ICD-10-CM | POA: Diagnosis not present

## 2018-05-27 DIAGNOSIS — E559 Vitamin D deficiency, unspecified: Secondary | ICD-10-CM | POA: Diagnosis not present

## 2018-05-27 DIAGNOSIS — E039 Hypothyroidism, unspecified: Secondary | ICD-10-CM | POA: Diagnosis not present

## 2018-05-27 DIAGNOSIS — F0390 Unspecified dementia without behavioral disturbance: Secondary | ICD-10-CM | POA: Diagnosis not present

## 2018-05-27 DIAGNOSIS — M1A09X1 Idiopathic chronic gout, multiple sites, with tophus (tophi): Secondary | ICD-10-CM | POA: Diagnosis not present

## 2018-06-02 ENCOUNTER — Ambulatory Visit
Admission: RE | Admit: 2018-06-02 | Discharge: 2018-06-02 | Disposition: A | Discharge status: Home / Self Care | Payer: Medicare HMO | Source: Ambulatory Visit | Attending: Neurology | Admitting: Neurology

## 2018-06-02 DIAGNOSIS — H353132 Nonexudative age-related macular degeneration, bilateral, intermediate dry stage: Secondary | ICD-10-CM | POA: Diagnosis not present

## 2018-06-02 DIAGNOSIS — R413 Other amnesia: Secondary | ICD-10-CM

## 2018-06-02 DIAGNOSIS — H1820 Unspecified corneal edema: Secondary | ICD-10-CM | POA: Diagnosis not present

## 2018-06-03 DIAGNOSIS — E119 Type 2 diabetes mellitus without complications: Secondary | ICD-10-CM | POA: Diagnosis not present

## 2018-06-03 DIAGNOSIS — N183 Chronic kidney disease, stage 3 (moderate): Secondary | ICD-10-CM | POA: Diagnosis not present

## 2018-06-03 DIAGNOSIS — R945 Abnormal results of liver function studies: Secondary | ICD-10-CM | POA: Diagnosis not present

## 2018-06-03 DIAGNOSIS — E782 Mixed hyperlipidemia: Secondary | ICD-10-CM | POA: Diagnosis not present

## 2018-06-03 DIAGNOSIS — I129 Hypertensive chronic kidney disease with stage 1 through stage 4 chronic kidney disease, or unspecified chronic kidney disease: Secondary | ICD-10-CM | POA: Diagnosis not present

## 2018-06-03 DIAGNOSIS — E871 Hypo-osmolality and hyponatremia: Secondary | ICD-10-CM | POA: Diagnosis not present

## 2018-06-03 DIAGNOSIS — Z23 Encounter for immunization: Secondary | ICD-10-CM | POA: Diagnosis not present

## 2018-06-03 DIAGNOSIS — I2510 Atherosclerotic heart disease of native coronary artery without angina pectoris: Secondary | ICD-10-CM | POA: Diagnosis not present

## 2018-06-03 DIAGNOSIS — E1122 Type 2 diabetes mellitus with diabetic chronic kidney disease: Secondary | ICD-10-CM | POA: Diagnosis not present

## 2018-06-04 ENCOUNTER — Telehealth: Payer: Self-pay | Admitting: Neurology

## 2018-06-04 DIAGNOSIS — K760 Fatty (change of) liver, not elsewhere classified: Secondary | ICD-10-CM | POA: Diagnosis not present

## 2018-06-04 DIAGNOSIS — R945 Abnormal results of liver function studies: Secondary | ICD-10-CM | POA: Diagnosis not present

## 2018-06-04 NOTE — Telephone Encounter (Signed)
I called the patient, talk with the daughter.  The patient had another fall the morning of the CT scan, telephone had been ringing and she got up out of sleep and stumbled.  Normally she would use a walker or cane at home.  She has already been through some physical therapy for her walking.  The CT scan of the brain does not show any developing normal pressure hydrocephalus, no acute changes.    CT head 06/04/18:  IMPRESSION:   CT head (without) demonstrating: - Mild perisylvian atrophy. - Moderate periventricular and subcortical chronic small vessel ischemic disease.  - Left frontal scalp hematoma. - No intracranial hemorrhage. - Compared to CT on 11/27/17, the previously noted subarachnoid hemorrhage has resolved.

## 2018-06-13 DIAGNOSIS — S06301S Unspecified focal traumatic brain injury with loss of consciousness of 30 minutes or less, sequela: Secondary | ICD-10-CM | POA: Diagnosis not present

## 2018-06-13 DIAGNOSIS — R531 Weakness: Secondary | ICD-10-CM | POA: Diagnosis not present

## 2018-06-16 ENCOUNTER — Ambulatory Visit (HOSPITAL_COMMUNITY)
Admission: RE | Admit: 2018-06-16 | Discharge: 2018-06-16 | Disposition: A | Discharge status: Home / Self Care | Payer: Medicare HMO | Source: Ambulatory Visit | Attending: Neurology | Admitting: Neurology

## 2018-06-16 ENCOUNTER — Telehealth: Payer: Self-pay | Admitting: Neurology

## 2018-06-16 DIAGNOSIS — I6521 Occlusion and stenosis of right carotid artery: Secondary | ICD-10-CM | POA: Insufficient documentation

## 2018-06-16 NOTE — Progress Notes (Signed)
Preliminary notes--Bilateral carotid duplex exam completed. Bilateral ICAs 1-39% stenosis based on the category of velocities, Velocities may underestimate degree of stenosis due to more proximal obstruction.  Diffused calcified and soft plaques seen bilateral CCAs to ICAs. Bilateral vertebral arteries are patent with antegrade flow. Hongying Lillyauna Jenkinson (RDMS RVT) 06/16/18 10:13 AM

## 2018-06-16 NOTE — Telephone Encounter (Signed)
  I called the patient, talk with the daughter.  The carotid Doppler study was unremarkable, no evidence of a right carotid stenosis as suggested by the scan of the cervical spine previously.  Carotid doppler study 06/16/18:  Summary: Right Carotid: Velocities in the right ICA are consistent with a 1-39% stenosis.  Left Carotid: Velocities in the left ICA are consistent with a 1-39% stenosis.  Vertebrals: Bilateral vertebral arteries demonstrate antegrade flow. Subclavians: Normal flow hemodynamics were seen in bilateral subclavian       arteries.

## 2018-06-18 DIAGNOSIS — N183 Chronic kidney disease, stage 3 (moderate): Secondary | ICD-10-CM | POA: Diagnosis not present

## 2018-06-18 DIAGNOSIS — I129 Hypertensive chronic kidney disease with stage 1 through stage 4 chronic kidney disease, or unspecified chronic kidney disease: Secondary | ICD-10-CM | POA: Diagnosis not present

## 2018-06-18 DIAGNOSIS — E871 Hypo-osmolality and hyponatremia: Secondary | ICD-10-CM | POA: Diagnosis not present

## 2018-06-22 ENCOUNTER — Encounter (HOSPITAL_COMMUNITY): Payer: Medicare HMO

## 2018-06-22 DIAGNOSIS — C4442 Squamous cell carcinoma of skin of scalp and neck: Secondary | ICD-10-CM | POA: Diagnosis not present

## 2018-06-25 ENCOUNTER — Other Ambulatory Visit: Payer: Self-pay | Admitting: Internal Medicine

## 2018-06-25 ENCOUNTER — Ambulatory Visit
Admission: RE | Admit: 2018-06-25 | Discharge: 2018-06-25 | Disposition: A | Discharge status: Home / Self Care | Payer: Medicare HMO | Source: Ambulatory Visit | Attending: Internal Medicine | Admitting: Internal Medicine

## 2018-06-25 DIAGNOSIS — C50912 Malignant neoplasm of unspecified site of left female breast: Secondary | ICD-10-CM | POA: Diagnosis not present

## 2018-06-25 DIAGNOSIS — Z1231 Encounter for screening mammogram for malignant neoplasm of breast: Secondary | ICD-10-CM

## 2018-06-25 HISTORY — DX: Malignant neoplasm of unspecified site of unspecified female breast: C50.919

## 2018-07-14 DIAGNOSIS — R531 Weakness: Secondary | ICD-10-CM | POA: Diagnosis not present

## 2018-07-14 DIAGNOSIS — S06301S Unspecified focal traumatic brain injury with loss of consciousness of 30 minutes or less, sequela: Secondary | ICD-10-CM | POA: Diagnosis not present

## 2018-07-27 DIAGNOSIS — S0100XD Unspecified open wound of scalp, subsequent encounter: Secondary | ICD-10-CM | POA: Diagnosis not present

## 2018-08-01 IMAGING — CR DG HAND COMPLETE 3+V*R*
3 series · 3 of 3 positions shown · non-contrast
Comparison: Radiographs February 21, 2013.

CLINICAL DATA: Bilateral hand pain after falling.

EXAM:
RIGHT HAND - COMPLETE 3+ VIEW

[hand pa]
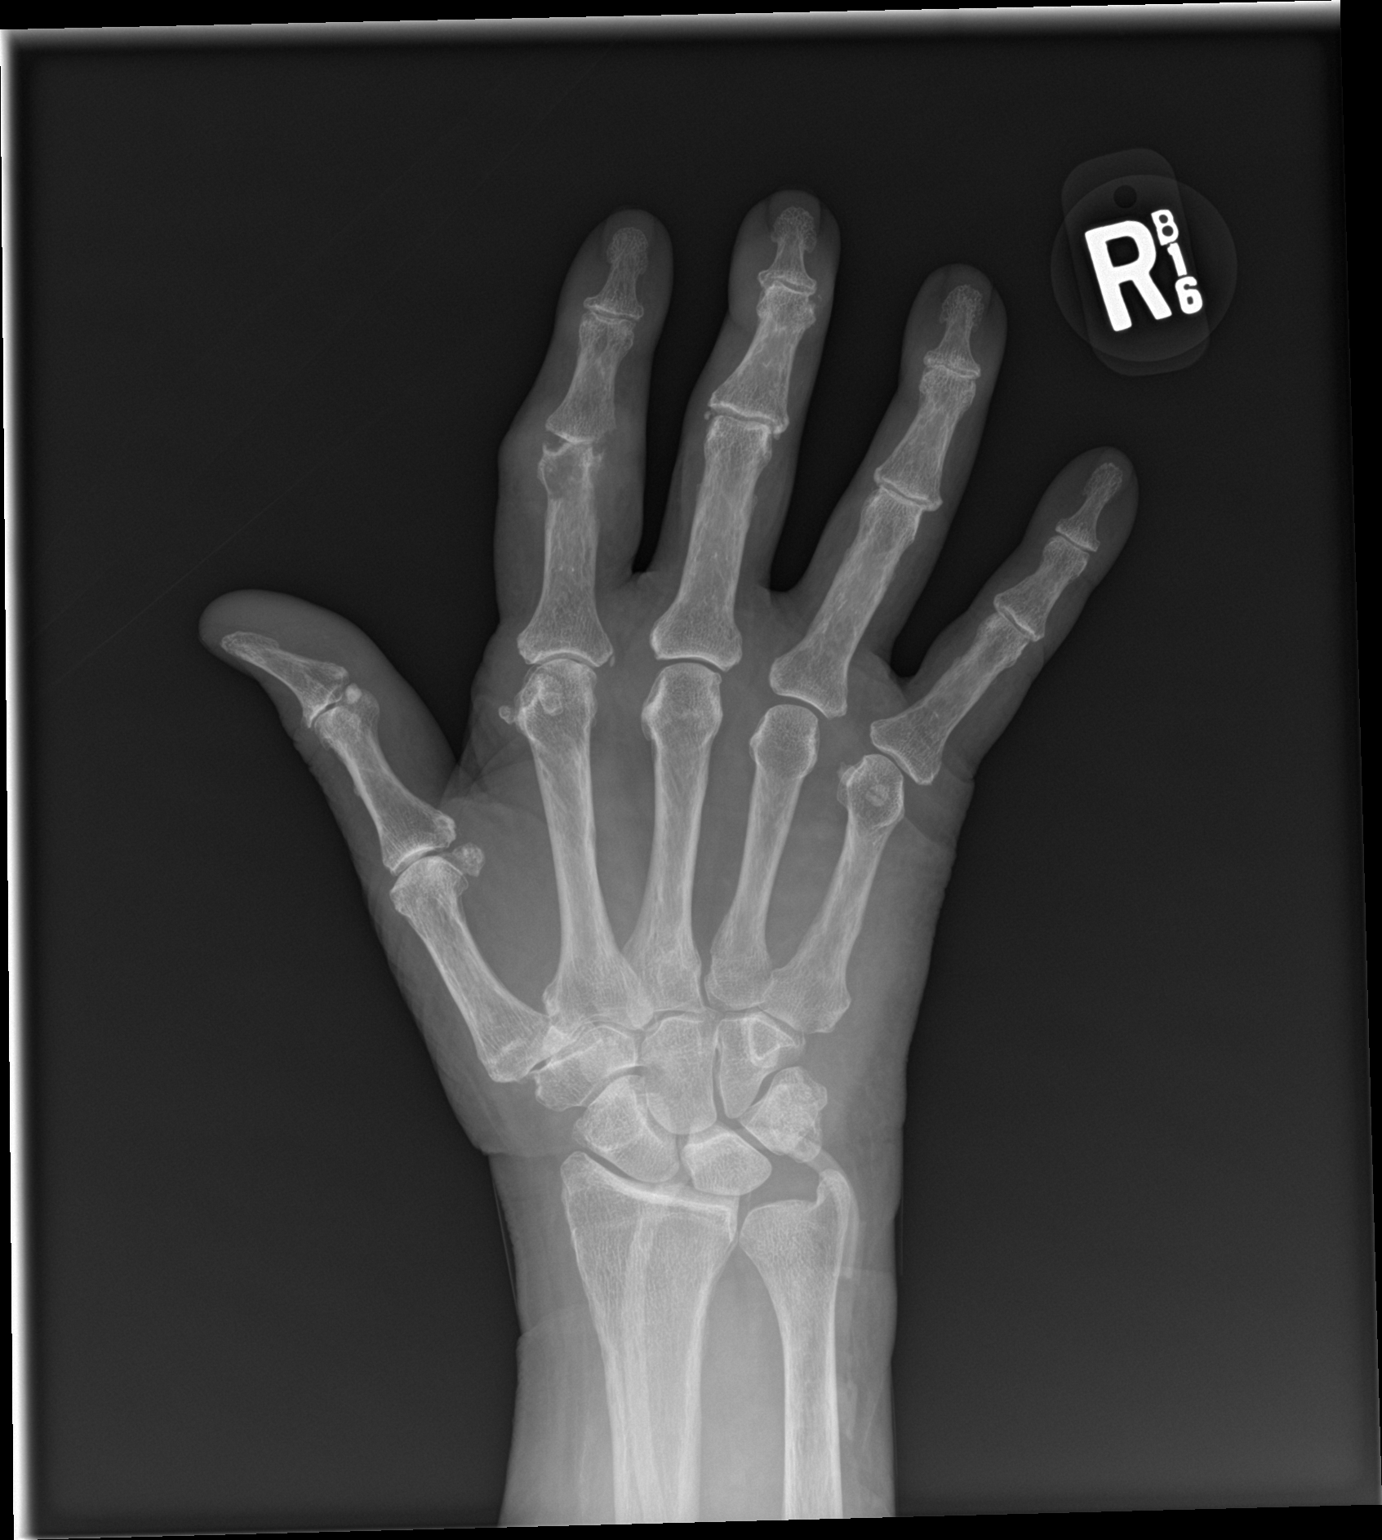

[hand lat]
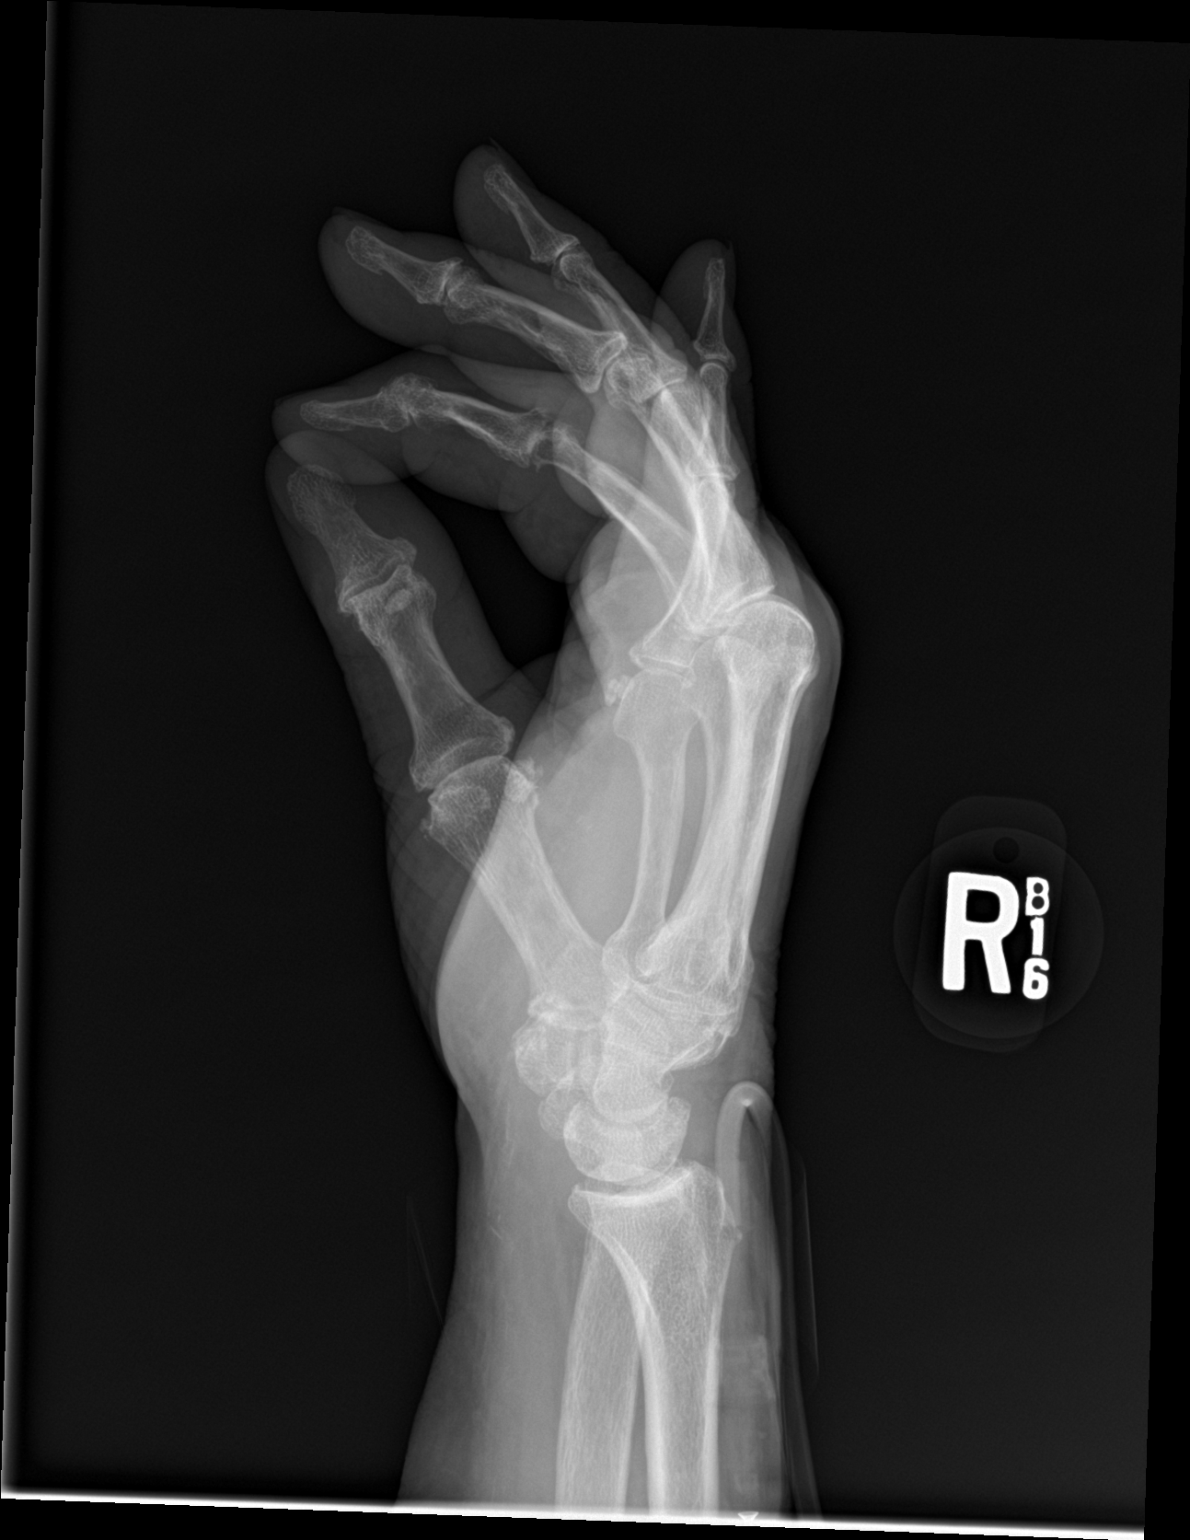

[hand obl]
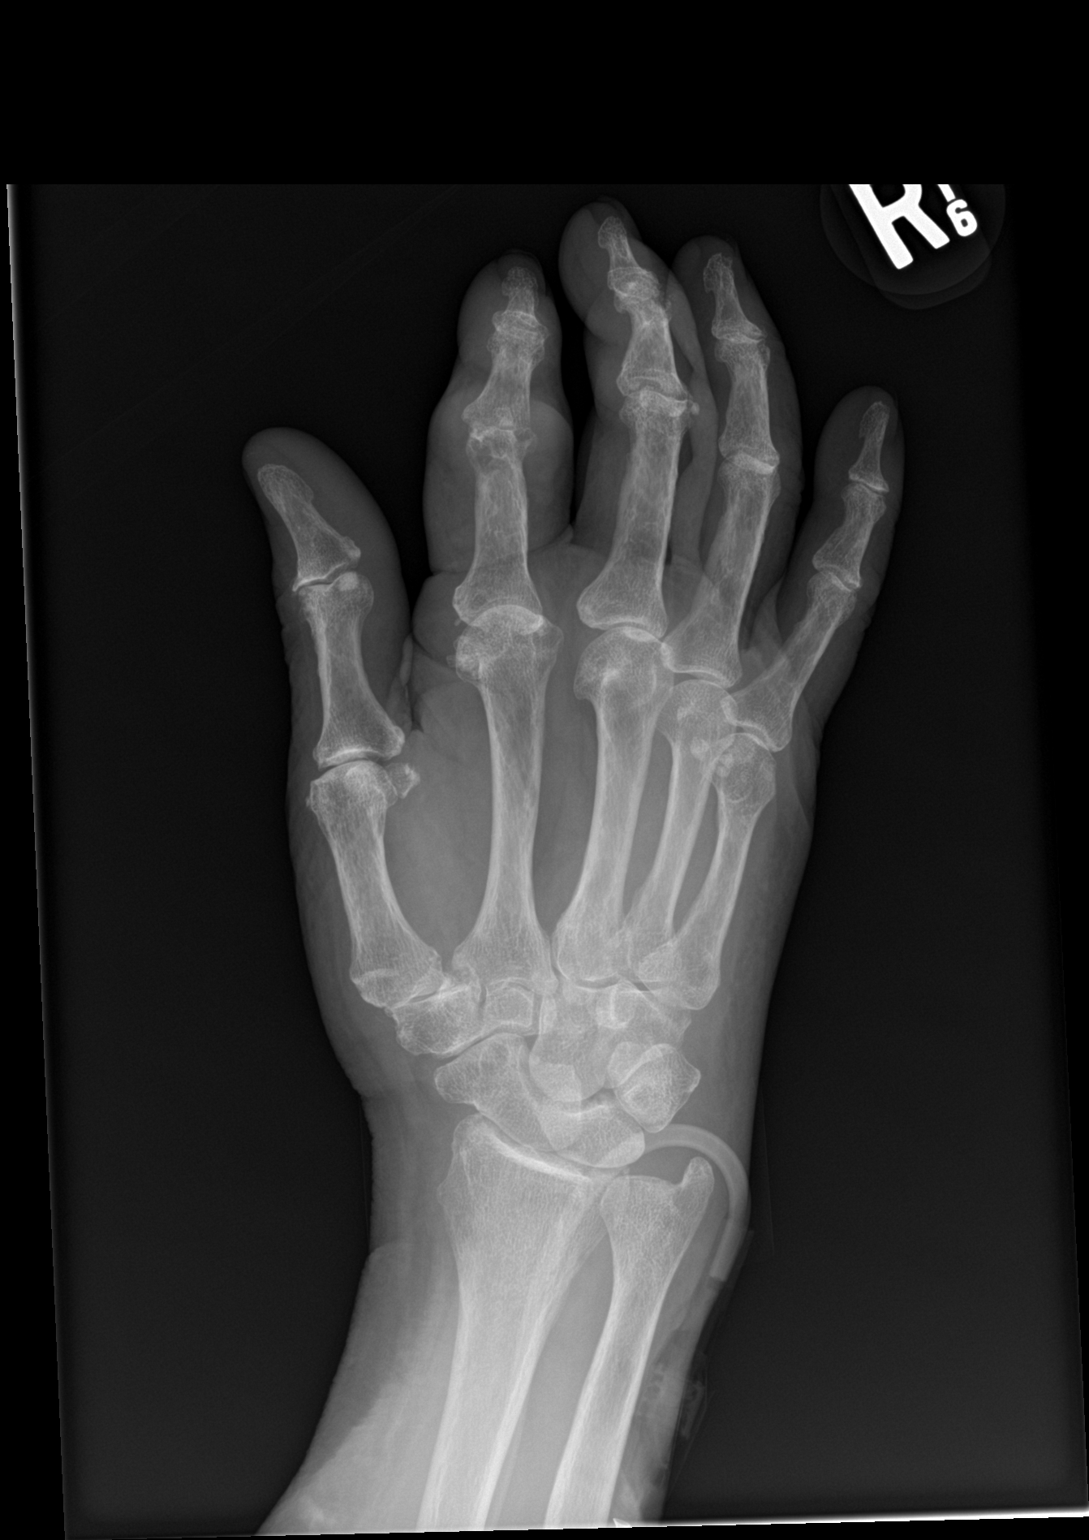

[3 of 3 positions shown; findings below may reference images not displayed]

FINDINGS: There is interval development involving lytic destruction involving
the medial aspect of the distal portion of second proximal phalanx
and proximal base of second middle phalanx. Lucency is also noted in
the distal portion of the second proximal phalanx. This is
concerning for possible malignancy or osteomyelitis or some form of
erosive arthritis. MRI is recommended for further evaluation. No
other fracture or bony abnormality is noted.
IMPRESSION: There is lytic destruction seen involving portions of the second
proximal and middle phalanges concerning for osteomyelitis, or
erosive arthritis or possibly malignancy. Further evaluation with
MRI with and without gadolinium is recommended.

## 2018-08-01 IMAGING — CR DG WRIST COMPLETE 3+V*L*
4 series · 4 of 4 positions shown · non-contrast
Comparison: None.

CLINICAL DATA: Left wrist pain after fall.

EXAM:
LEFT WRIST - COMPLETE 3+ VIEW

[wrist pa]
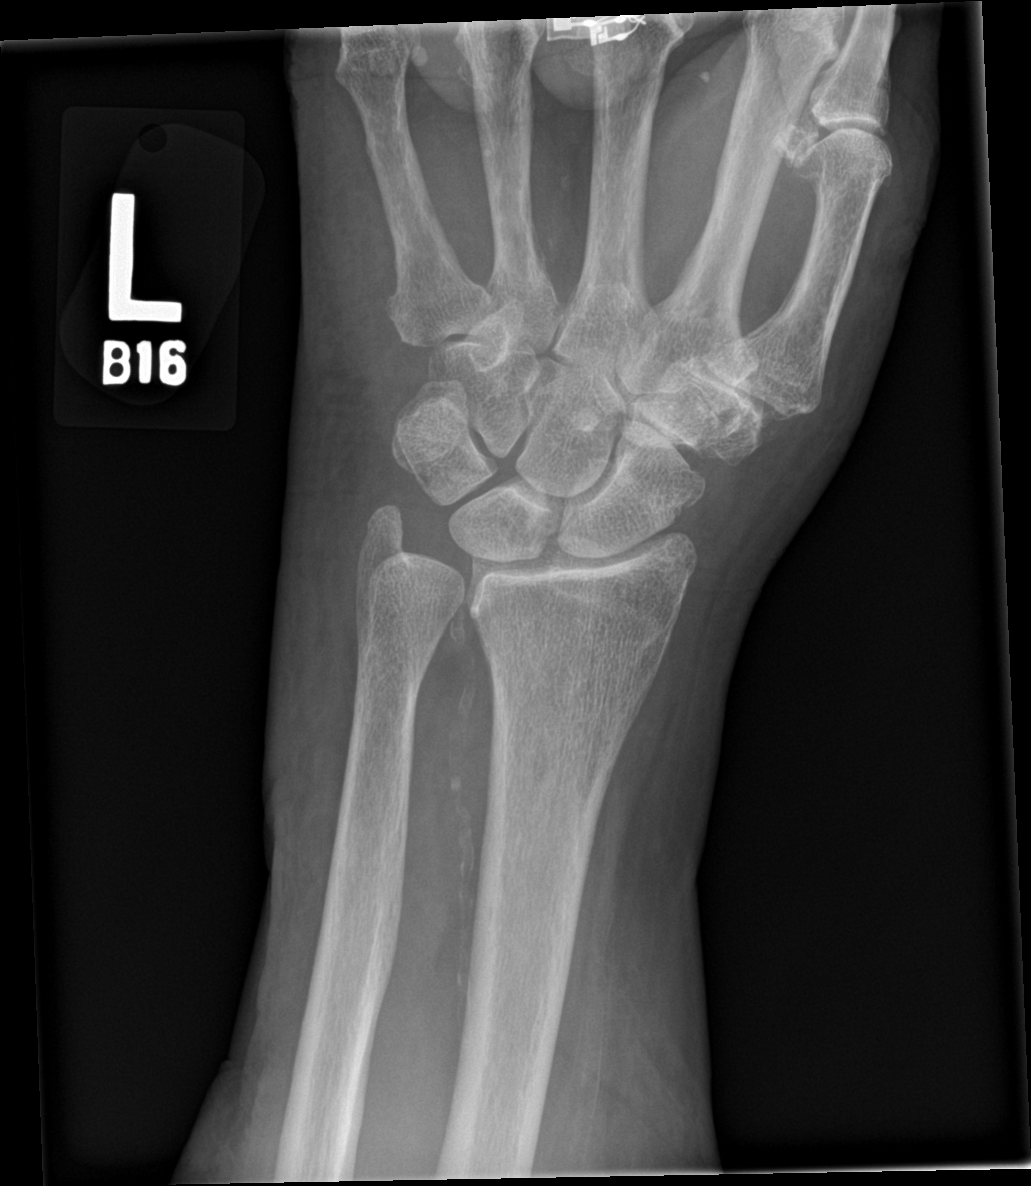

[wrist obl]
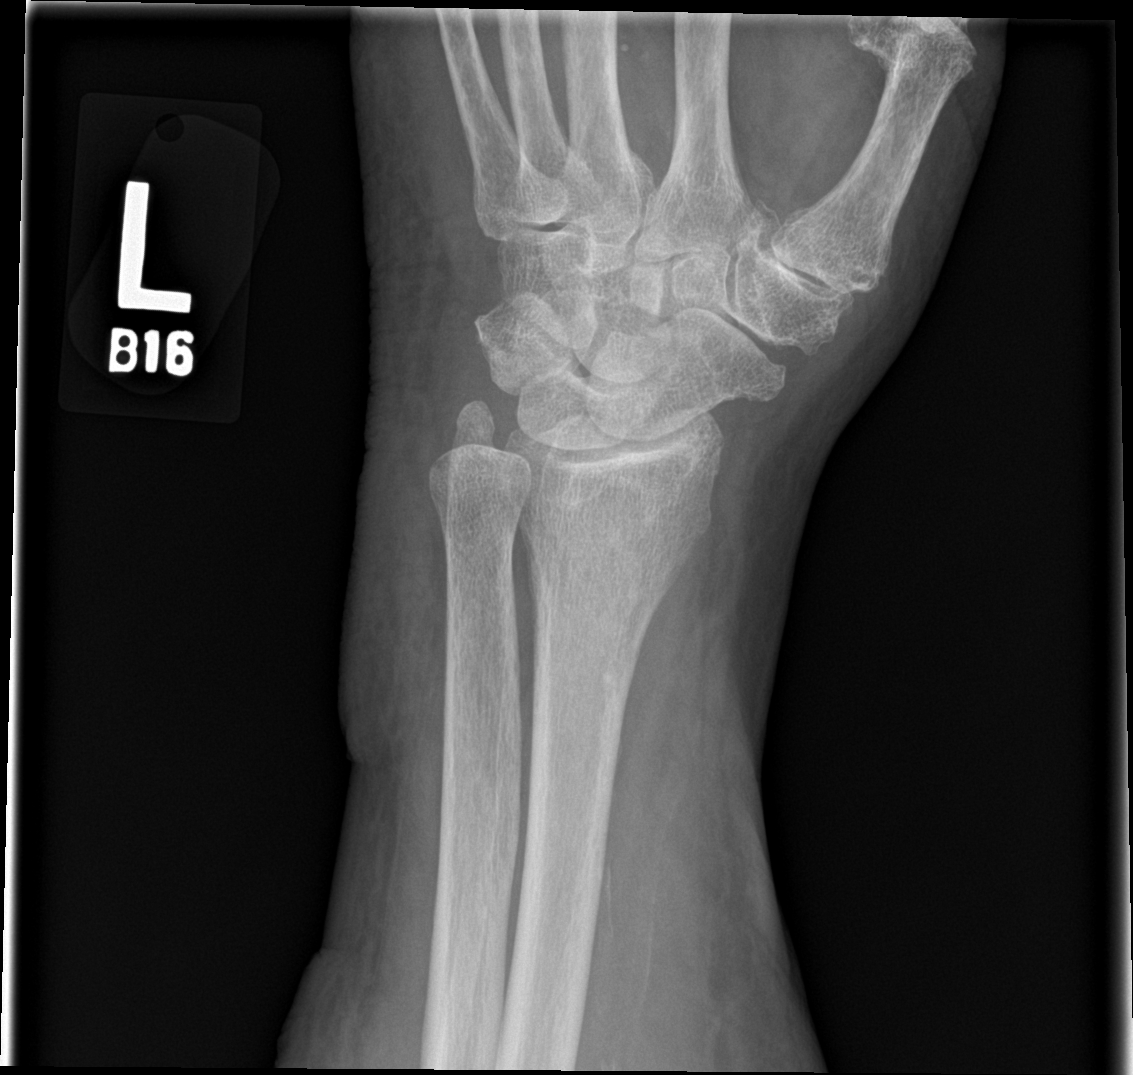

[wrist lat]
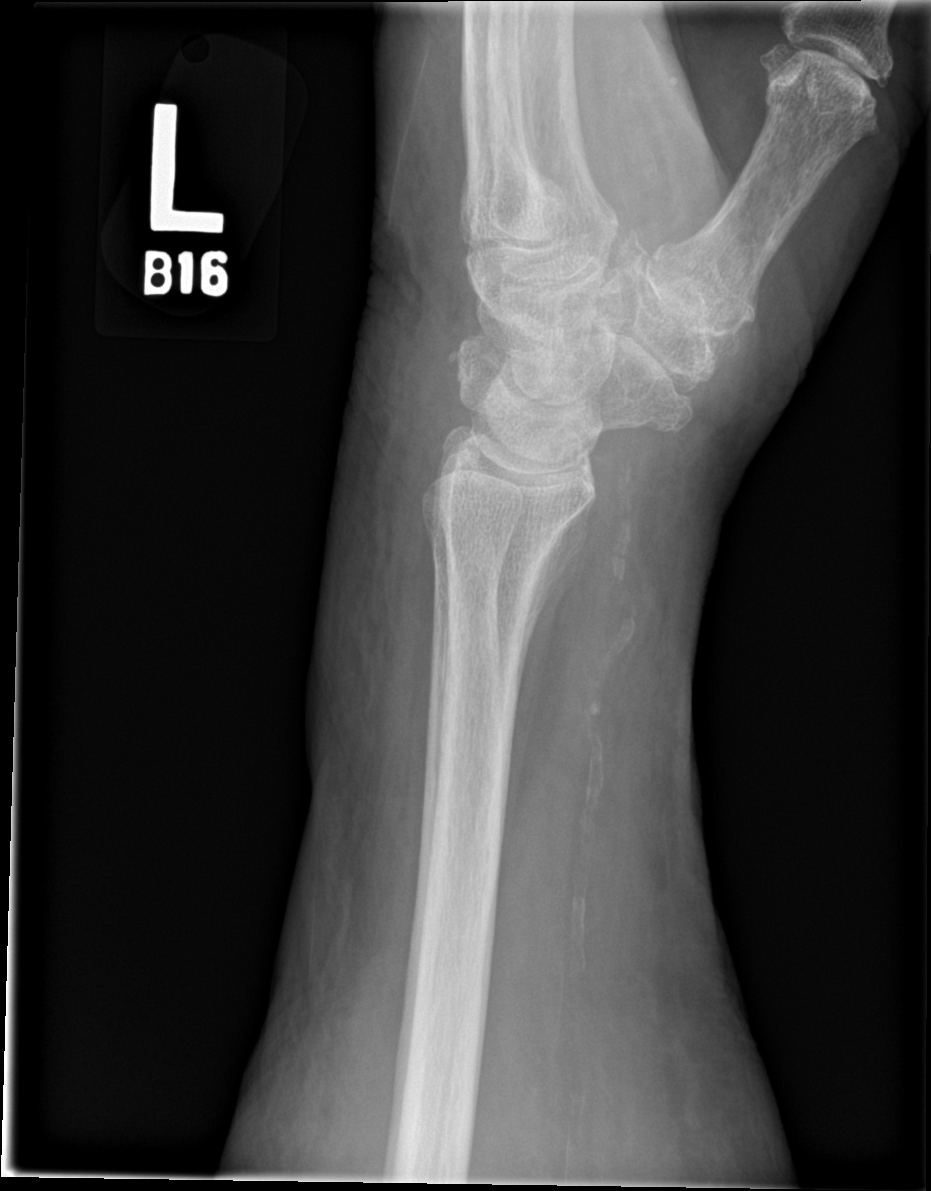

[wrist navicular]
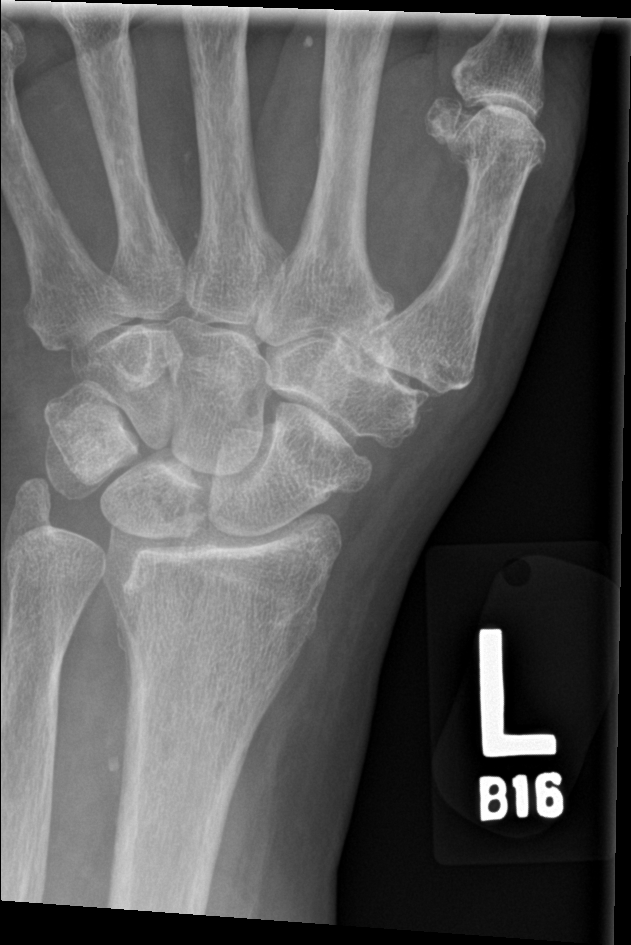

[4 of 4 positions shown; findings below may reference images not displayed]

FINDINGS: There is no evidence of fracture or dislocation. Moderate narrowing
and osteophyte formation is seen involving the first carpometacarpal
joint. Soft tissues are unremarkable.
IMPRESSION: Moderate osteoarthritis of the first carpometacarpal joint. No acute
abnormality seen in the left wrist.

## 2018-08-01 IMAGING — CR DG WRIST COMPLETE 3+V*R*
4 series · 4 of 4 positions shown · non-contrast
Comparison: None.

CLINICAL DATA: Fall.  Pain.

EXAM:
RIGHT WRIST - COMPLETE 3+ VIEW

[wrist pa]
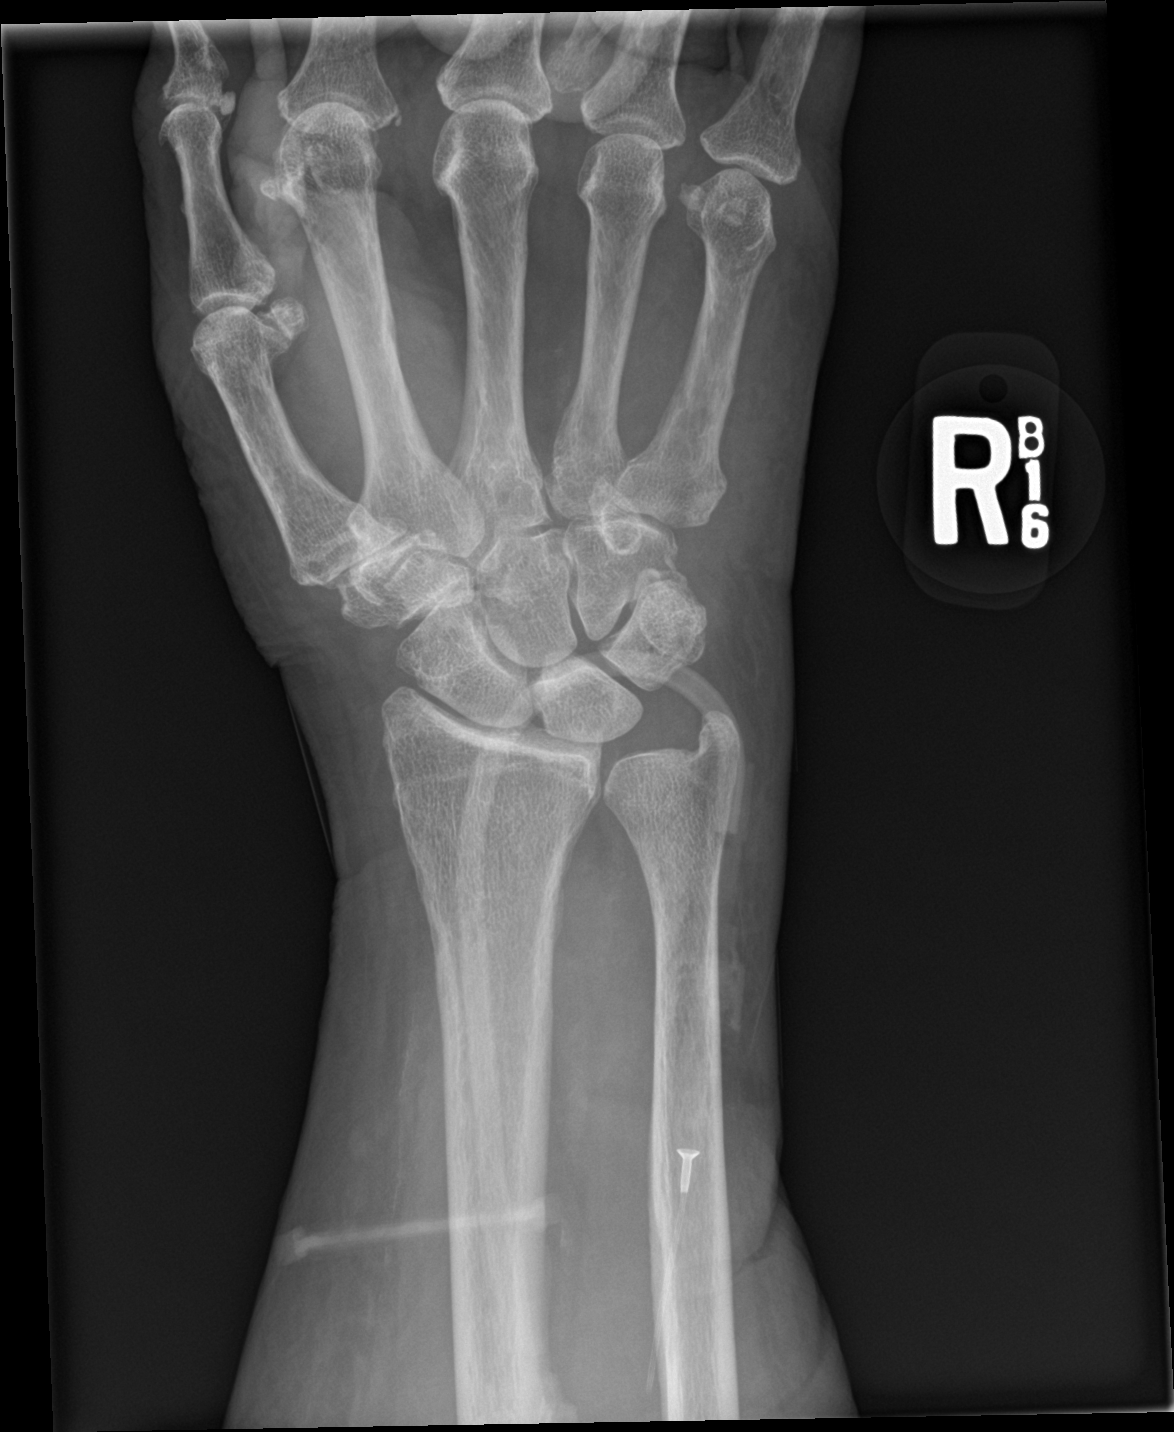

[wrist obl]
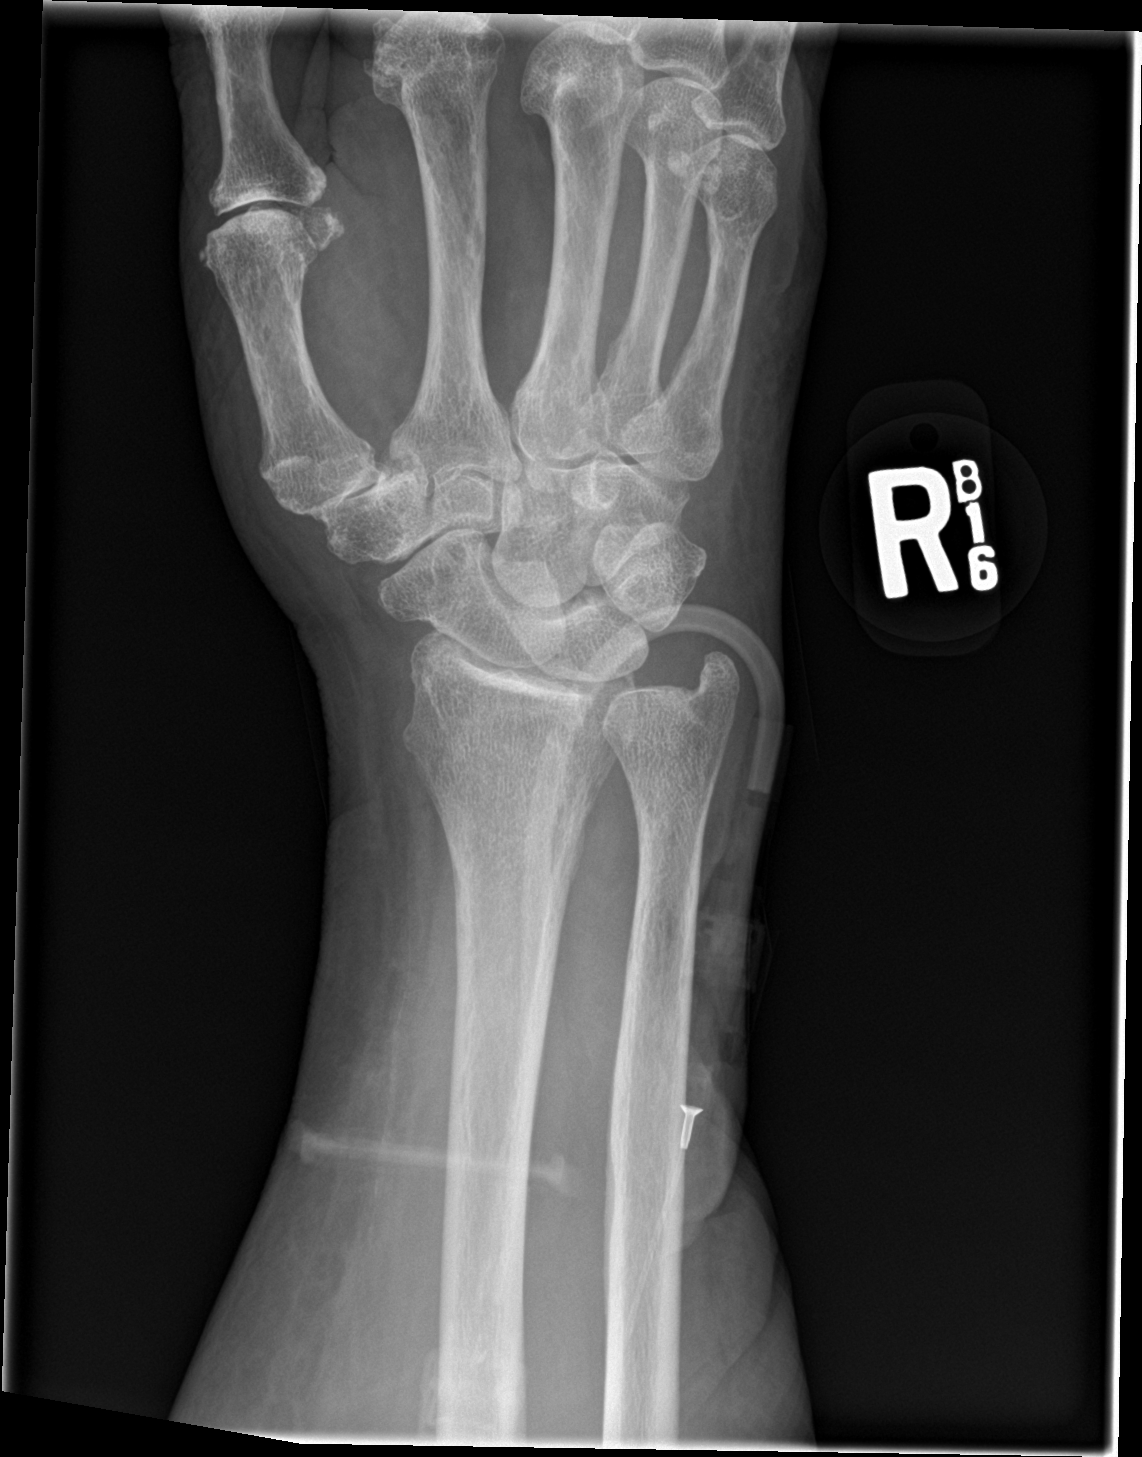

[wrist lat]
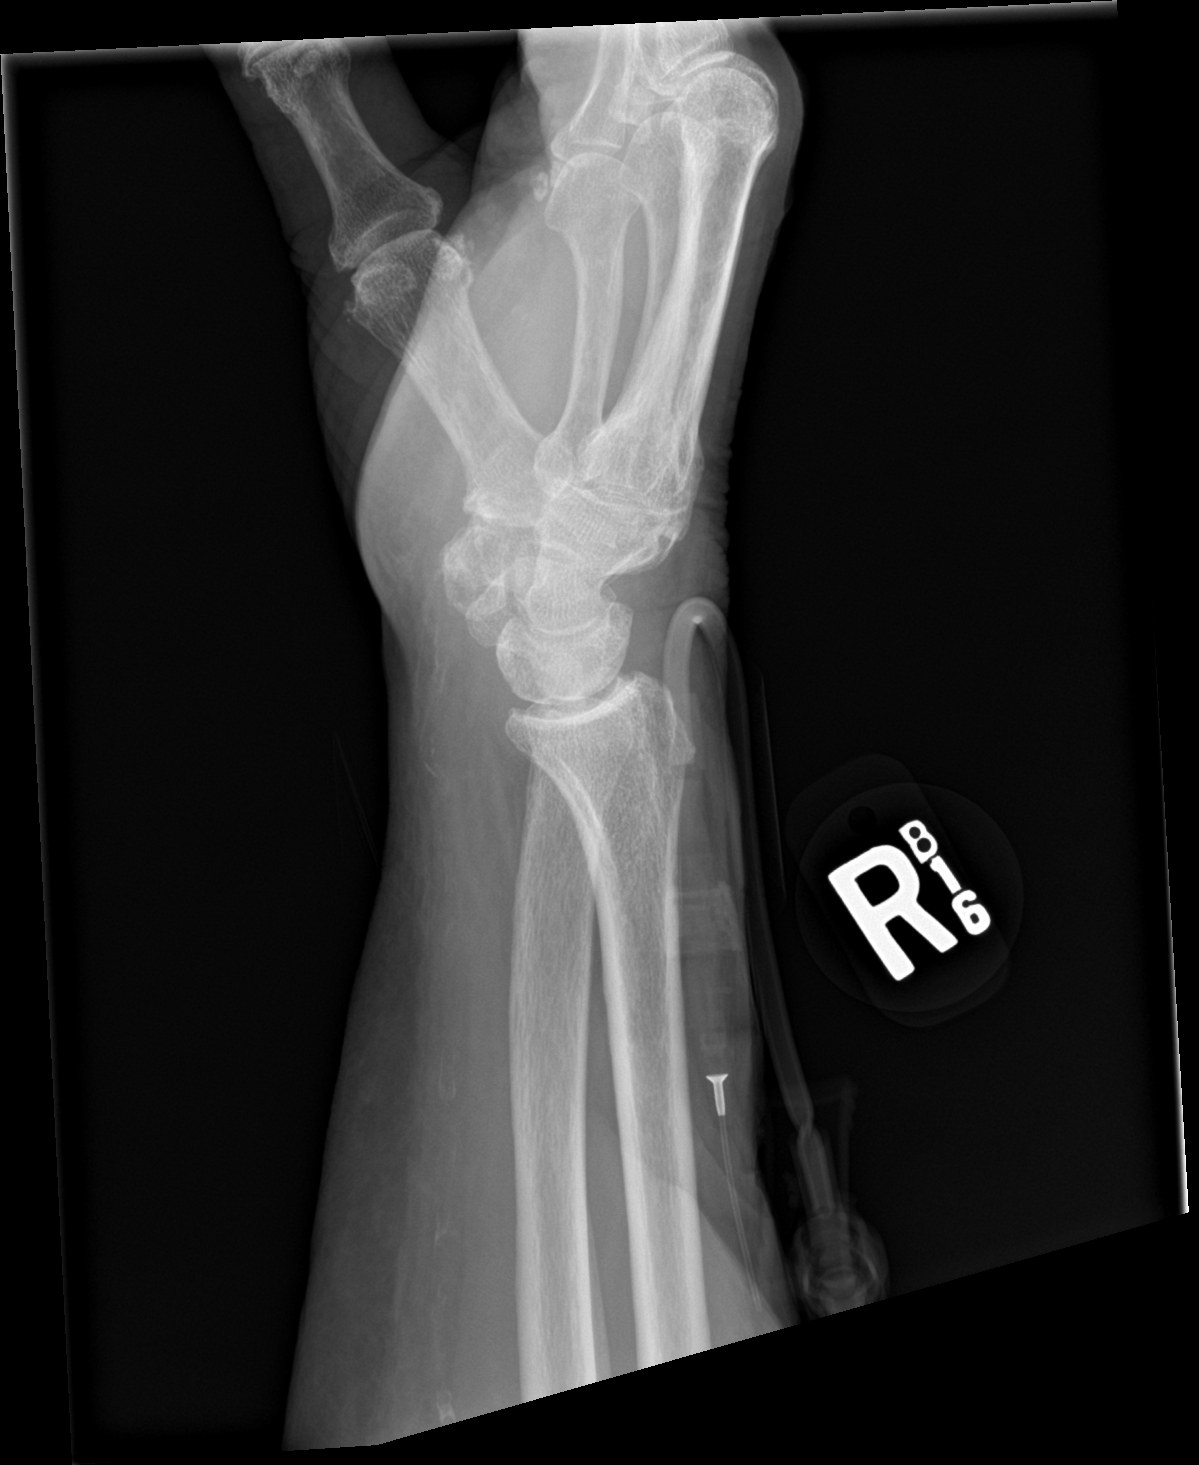

[wrist navicular]
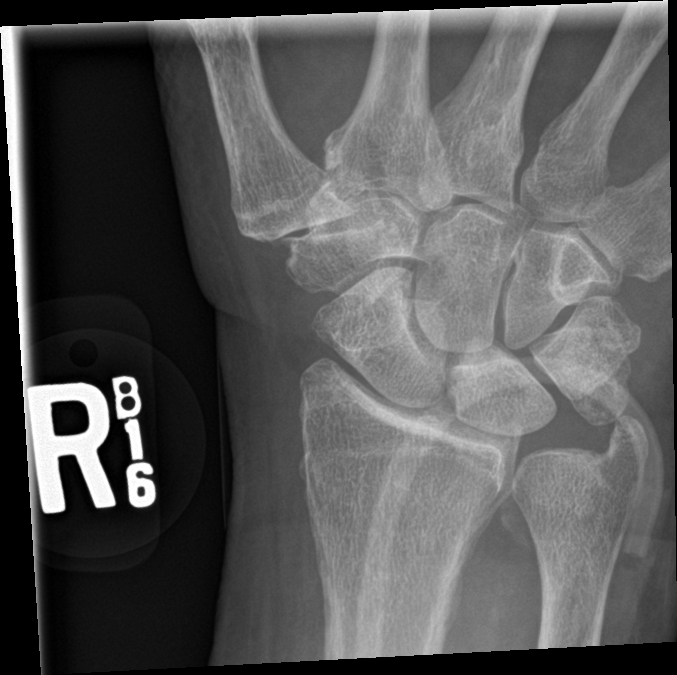

[4 of 4 positions shown; findings below may reference images not displayed]

FINDINGS: There is no evidence of fracture or dislocation. There is no
evidence of arthropathy or other focal bone abnormality. Soft
tissues are unremarkable, except for overlying IV tubing.
IMPRESSION: Negative.

## 2018-08-13 DIAGNOSIS — R531 Weakness: Secondary | ICD-10-CM | POA: Diagnosis not present

## 2018-08-13 DIAGNOSIS — S06301S Unspecified focal traumatic brain injury with loss of consciousness of 30 minutes or less, sequela: Secondary | ICD-10-CM | POA: Diagnosis not present

## 2018-09-13 DIAGNOSIS — R531 Weakness: Secondary | ICD-10-CM | POA: Diagnosis not present

## 2018-09-13 DIAGNOSIS — S06301S Unspecified focal traumatic brain injury with loss of consciousness of 30 minutes or less, sequela: Secondary | ICD-10-CM | POA: Diagnosis not present

## 2018-09-17 DIAGNOSIS — Z23 Encounter for immunization: Secondary | ICD-10-CM | POA: Diagnosis not present

## 2018-09-17 DIAGNOSIS — E871 Hypo-osmolality and hyponatremia: Secondary | ICD-10-CM | POA: Diagnosis not present

## 2018-09-17 DIAGNOSIS — Z0000 Encounter for general adult medical examination without abnormal findings: Secondary | ICD-10-CM | POA: Diagnosis not present

## 2018-09-17 DIAGNOSIS — I129 Hypertensive chronic kidney disease with stage 1 through stage 4 chronic kidney disease, or unspecified chronic kidney disease: Secondary | ICD-10-CM | POA: Diagnosis not present

## 2018-09-17 DIAGNOSIS — I10 Essential (primary) hypertension: Secondary | ICD-10-CM | POA: Diagnosis not present

## 2018-09-17 DIAGNOSIS — N390 Urinary tract infection, site not specified: Secondary | ICD-10-CM | POA: Diagnosis not present

## 2018-09-17 DIAGNOSIS — N183 Chronic kidney disease, stage 3 (moderate): Secondary | ICD-10-CM | POA: Diagnosis not present

## 2018-09-18 ENCOUNTER — Ambulatory Visit: Payer: Medicare HMO | Admitting: Podiatry

## 2018-09-18 ENCOUNTER — Encounter: Payer: Self-pay | Admitting: Podiatry

## 2018-09-18 DIAGNOSIS — M79674 Pain in right toe(s): Secondary | ICD-10-CM

## 2018-09-18 DIAGNOSIS — B351 Tinea unguium: Secondary | ICD-10-CM | POA: Diagnosis not present

## 2018-09-18 DIAGNOSIS — E1142 Type 2 diabetes mellitus with diabetic polyneuropathy: Secondary | ICD-10-CM | POA: Diagnosis not present

## 2018-09-18 DIAGNOSIS — M79675 Pain in left toe(s): Secondary | ICD-10-CM | POA: Diagnosis not present

## 2018-09-18 NOTE — Progress Notes (Signed)
Complaint:  Visit Type: Patient returns to my office for continued preventative foot care services. Complaint: Patient states" my nails have grown long and thick and become painful to walk and wear shoes" Patient has been diagnosed with DM with no foot complications. The patient presents for preventative foot care services. No changes to ROS  Podiatric Exam: Vascular: dorsalis pedis and posterior tibial pulses are weakly  palpable bilateral. Capillary return is immediate. Temperature gradient is WNL. Skin turgor WNL  Sensorium: Diminished/Absent Semmes Weinstein monofilament test. Normal tactile sensation bilaterally. Nail Exam: Pt has thick disfigured discolored nails with subungual debris noted bilateral entire nail hallux through fifth toenails Ulcer Exam: There is no evidence of ulcer or pre-ulcerative changes or infection. Orthopedic Exam: Muscle tone and strength are WNL. No limitations in general ROM. No crepitus or effusions noted. Foot type and digits show no abnormalities. Bony prominences are unremarkable. Skin: No Porokeratosis. No infection or ulcers  Diagnosis:  Onychomycosis, , Pain in right toe, pain in left toes  Treatment & Plan Procedures and Treatment: Consent by patient was obtained for treatment procedures.   Debridement of mycotic and hypertrophic toenails, 1 through 5 bilateral and clearing of subungual debris. No ulceration, no infection noted. Upon debridement of left hallux nail plate there was subungual bleeding noted.  The nail is unattached at the site of bleeding.  The bleeding site was cauterized and neosporin/DSD was applied.  She was told to soak toe through the weekend.  RTC prn. Return Visit-Office Procedure: Patient instructed to return to the office for a follow up visit 3 months for continued evaluation and treatment.    Gardiner Barefoot DPM

## 2018-10-05 DIAGNOSIS — E1169 Type 2 diabetes mellitus with other specified complication: Secondary | ICD-10-CM | POA: Diagnosis not present

## 2018-10-05 DIAGNOSIS — E119 Type 2 diabetes mellitus without complications: Secondary | ICD-10-CM | POA: Diagnosis not present

## 2018-10-05 DIAGNOSIS — E1122 Type 2 diabetes mellitus with diabetic chronic kidney disease: Secondary | ICD-10-CM | POA: Diagnosis not present

## 2018-10-05 DIAGNOSIS — C50912 Malignant neoplasm of unspecified site of left female breast: Secondary | ICD-10-CM | POA: Diagnosis not present

## 2018-10-05 DIAGNOSIS — I2510 Atherosclerotic heart disease of native coronary artery without angina pectoris: Secondary | ICD-10-CM | POA: Diagnosis not present

## 2018-10-05 DIAGNOSIS — I129 Hypertensive chronic kidney disease with stage 1 through stage 4 chronic kidney disease, or unspecified chronic kidney disease: Secondary | ICD-10-CM | POA: Diagnosis not present

## 2018-10-05 DIAGNOSIS — Z0000 Encounter for general adult medical examination without abnormal findings: Secondary | ICD-10-CM | POA: Diagnosis not present

## 2018-10-05 DIAGNOSIS — E039 Hypothyroidism, unspecified: Secondary | ICD-10-CM | POA: Diagnosis not present

## 2018-10-05 DIAGNOSIS — N183 Chronic kidney disease, stage 3 (moderate): Secondary | ICD-10-CM | POA: Diagnosis not present

## 2018-10-05 DIAGNOSIS — E782 Mixed hyperlipidemia: Secondary | ICD-10-CM | POA: Diagnosis not present

## 2018-10-14 DIAGNOSIS — R531 Weakness: Secondary | ICD-10-CM | POA: Diagnosis not present

## 2018-10-14 DIAGNOSIS — S06301S Unspecified focal traumatic brain injury with loss of consciousness of 30 minutes or less, sequela: Secondary | ICD-10-CM | POA: Diagnosis not present

## 2018-11-12 DIAGNOSIS — S06301S Unspecified focal traumatic brain injury with loss of consciousness of 30 minutes or less, sequela: Secondary | ICD-10-CM | POA: Diagnosis not present

## 2018-11-12 DIAGNOSIS — R531 Weakness: Secondary | ICD-10-CM | POA: Diagnosis not present

## 2018-11-18 ENCOUNTER — Ambulatory Visit: Payer: Medicare HMO | Admitting: Nurse Practitioner

## 2018-11-18 ENCOUNTER — Ambulatory Visit: Payer: Medicare HMO | Admitting: Neurology

## 2018-12-13 DIAGNOSIS — R531 Weakness: Secondary | ICD-10-CM | POA: Diagnosis not present

## 2018-12-13 DIAGNOSIS — S06301S Unspecified focal traumatic brain injury with loss of consciousness of 30 minutes or less, sequela: Secondary | ICD-10-CM | POA: Diagnosis not present

## 2018-12-16 ENCOUNTER — Telehealth: Payer: Self-pay | Admitting: Neurology

## 2018-12-16 NOTE — Telephone Encounter (Signed)
daughter and her pateint wanted to CX untill Cvoid was over she will call back to sch. her mother Sandy Owens 6 mos NP/[place]

## 2018-12-18 ENCOUNTER — Ambulatory Visit: Payer: Medicare HMO | Admitting: Podiatry

## 2018-12-21 ENCOUNTER — Ambulatory Visit: Payer: Medicare HMO | Admitting: Neurology

## 2019-01-13 ENCOUNTER — Ambulatory Visit: Payer: Medicare HMO | Admitting: Podiatry

## 2019-03-10 ENCOUNTER — Encounter: Payer: Self-pay | Admitting: Podiatry

## 2019-03-10 ENCOUNTER — Other Ambulatory Visit: Payer: Self-pay

## 2019-03-10 ENCOUNTER — Ambulatory Visit (INDEPENDENT_AMBULATORY_CARE_PROVIDER_SITE_OTHER): Payer: Medicare HMO | Admitting: Podiatry

## 2019-03-10 DIAGNOSIS — E669 Obesity, unspecified: Secondary | ICD-10-CM | POA: Diagnosis not present

## 2019-03-10 DIAGNOSIS — E1169 Type 2 diabetes mellitus with other specified complication: Secondary | ICD-10-CM | POA: Diagnosis not present

## 2019-03-10 DIAGNOSIS — B351 Tinea unguium: Secondary | ICD-10-CM | POA: Insufficient documentation

## 2019-03-10 DIAGNOSIS — M79674 Pain in right toe(s): Secondary | ICD-10-CM | POA: Diagnosis not present

## 2019-03-10 DIAGNOSIS — M79675 Pain in left toe(s): Secondary | ICD-10-CM | POA: Diagnosis not present

## 2019-03-10 NOTE — Progress Notes (Signed)
Complaint:  Visit Type: Patient returns to my office for continued preventative foot care services. Complaint: Patient states" my nails have grown long and thick and become painful to walk and wear shoes" Patient has been diagnosed with DM with no foot complications. The patient presents for preventative foot care services. No changes to ROS  Podiatric Exam: Vascular: dorsalis pedis and posterior tibial pulses are weakly  palpable bilateral. Capillary return is immediate. Temperature gradient is WNL. Skin turgor WNL  Sensorium: Diminished/Absent Semmes Weinstein monofilament test. Normal tactile sensation bilaterally. Nail Exam: Pt has thick disfigured discolored nails with subungual debris noted bilateral entire nail hallux through fifth toenails Ulcer Exam: There is no evidence of ulcer or pre-ulcerative changes or infection. Orthopedic Exam: Muscle tone and strength are WNL. No limitations in general ROM. No crepitus or effusions noted. Foot type and digits show no abnormalities. Bony prominences are unremarkable. Skin: No Porokeratosis. No infection or ulcers  Diagnosis:  Onychomycosis, , Pain in right toe, pain in left toes  Treatment & Plan Procedures and Treatment: Consent by patient was obtained for treatment procedures.   Debridement of mycotic and hypertrophic toenails, 1 through 5 bilateral and clearing of subungual debris. No ulceration, no infection noted.  Return Visit-Office Procedure: Patient instructed to return to the office for a follow up visit 4 months for continued evaluation and treatment.    Gardiner Barefoot DPM

## 2019-03-29 DIAGNOSIS — I2510 Atherosclerotic heart disease of native coronary artery without angina pectoris: Secondary | ICD-10-CM | POA: Diagnosis not present

## 2019-03-29 DIAGNOSIS — E782 Mixed hyperlipidemia: Secondary | ICD-10-CM | POA: Diagnosis not present

## 2019-03-29 DIAGNOSIS — I129 Hypertensive chronic kidney disease with stage 1 through stage 4 chronic kidney disease, or unspecified chronic kidney disease: Secondary | ICD-10-CM | POA: Diagnosis not present

## 2019-03-29 DIAGNOSIS — E119 Type 2 diabetes mellitus without complications: Secondary | ICD-10-CM | POA: Diagnosis not present

## 2019-04-05 DIAGNOSIS — E039 Hypothyroidism, unspecified: Secondary | ICD-10-CM | POA: Diagnosis not present

## 2019-04-05 DIAGNOSIS — Z7189 Other specified counseling: Secondary | ICD-10-CM | POA: Diagnosis not present

## 2019-04-05 DIAGNOSIS — E782 Mixed hyperlipidemia: Secondary | ICD-10-CM | POA: Diagnosis not present

## 2019-04-05 DIAGNOSIS — I129 Hypertensive chronic kidney disease with stage 1 through stage 4 chronic kidney disease, or unspecified chronic kidney disease: Secondary | ICD-10-CM | POA: Diagnosis not present

## 2019-04-05 DIAGNOSIS — F5101 Primary insomnia: Secondary | ICD-10-CM | POA: Diagnosis not present

## 2019-04-05 DIAGNOSIS — E119 Type 2 diabetes mellitus without complications: Secondary | ICD-10-CM | POA: Diagnosis not present

## 2019-04-05 DIAGNOSIS — E1169 Type 2 diabetes mellitus with other specified complication: Secondary | ICD-10-CM | POA: Diagnosis not present

## 2019-06-03 DIAGNOSIS — Z23 Encounter for immunization: Secondary | ICD-10-CM | POA: Diagnosis not present

## 2019-06-07 ENCOUNTER — Other Ambulatory Visit: Payer: Self-pay | Admitting: Internal Medicine

## 2019-06-07 DIAGNOSIS — Z1231 Encounter for screening mammogram for malignant neoplasm of breast: Secondary | ICD-10-CM

## 2019-07-13 ENCOUNTER — Ambulatory Visit: Payer: Medicare HMO | Admitting: Podiatry

## 2019-07-13 ENCOUNTER — Encounter: Payer: Self-pay | Admitting: Podiatry

## 2019-07-13 ENCOUNTER — Other Ambulatory Visit: Payer: Self-pay

## 2019-07-13 DIAGNOSIS — B351 Tinea unguium: Secondary | ICD-10-CM

## 2019-07-13 DIAGNOSIS — E1142 Type 2 diabetes mellitus with diabetic polyneuropathy: Secondary | ICD-10-CM | POA: Diagnosis not present

## 2019-07-13 DIAGNOSIS — M79674 Pain in right toe(s): Secondary | ICD-10-CM

## 2019-07-13 DIAGNOSIS — M79675 Pain in left toe(s): Secondary | ICD-10-CM

## 2019-07-13 NOTE — Progress Notes (Signed)
Complaint:  Visit Type: Patient returns to my office for continued preventative foot care services. Complaint: Patient states" my nails have grown long and thick and become painful to walk and wear shoes" Patient has been diagnosed with DM with no foot complications. The patient presents for preventative foot care services. No changes to ROS  Podiatric Exam: Vascular: dorsalis pedis and posterior tibial pulses are weakly  palpable bilateral. Capillary return is immediate. Temperature gradient is WNL. Skin turgor WNL  Sensorium: Diminished/Absent Semmes Weinstein monofilament test. Normal tactile sensation bilaterally. Nail Exam: Pt has thick disfigured discolored nails with subungual debris noted bilateral entire nail hallux through fifth toenails Ulcer Exam: There is no evidence of ulcer or pre-ulcerative changes or infection. Orthopedic Exam: Muscle tone and strength are WNL. No limitations in general ROM. No crepitus or effusions noted. Foot type and digits show no abnormalities. Bony prominences are unremarkable. Skin: No Porokeratosis. No infection or ulcers  Diagnosis:  Onychomycosis, , Pain in right toe, pain in left toes  Treatment & Plan Procedures and Treatment: Consent by patient was obtained for treatment procedures.   Debridement of mycotic and hypertrophic toenails, 1 through 5 bilateral and clearing of subungual debris. No ulceration, no infection noted.  Return Visit-Office Procedure: Patient instructed to return to the office for a follow up visit 4 months for continued evaluation and treatment.    [place] DPM 

## 2019-07-21 DIAGNOSIS — H1820 Unspecified corneal edema: Secondary | ICD-10-CM | POA: Diagnosis not present

## 2019-07-21 DIAGNOSIS — H353132 Nonexudative age-related macular degeneration, bilateral, intermediate dry stage: Secondary | ICD-10-CM | POA: Diagnosis not present

## 2019-07-21 DIAGNOSIS — H04123 Dry eye syndrome of bilateral lacrimal glands: Secondary | ICD-10-CM | POA: Diagnosis not present

## 2019-07-21 DIAGNOSIS — E119 Type 2 diabetes mellitus without complications: Secondary | ICD-10-CM | POA: Diagnosis not present

## 2019-08-09 ENCOUNTER — Other Ambulatory Visit: Payer: Self-pay

## 2019-08-09 ENCOUNTER — Ambulatory Visit
Admission: RE | Admit: 2019-08-09 | Discharge: 2019-08-09 | Disposition: A | Discharge status: Home / Self Care | Payer: Medicare HMO | Source: Ambulatory Visit | Attending: Internal Medicine | Admitting: Internal Medicine

## 2019-08-09 DIAGNOSIS — Z1231 Encounter for screening mammogram for malignant neoplasm of breast: Secondary | ICD-10-CM | POA: Diagnosis not present

## 2019-10-18 DIAGNOSIS — Z0000 Encounter for general adult medical examination without abnormal findings: Secondary | ICD-10-CM | POA: Diagnosis not present

## 2019-10-18 DIAGNOSIS — Z780 Asymptomatic menopausal state: Secondary | ICD-10-CM | POA: Diagnosis not present

## 2019-10-18 DIAGNOSIS — E039 Hypothyroidism, unspecified: Secondary | ICD-10-CM | POA: Diagnosis not present

## 2019-10-18 DIAGNOSIS — E119 Type 2 diabetes mellitus without complications: Secondary | ICD-10-CM | POA: Diagnosis not present

## 2019-10-18 DIAGNOSIS — F5101 Primary insomnia: Secondary | ICD-10-CM | POA: Diagnosis not present

## 2019-10-18 DIAGNOSIS — N390 Urinary tract infection, site not specified: Secondary | ICD-10-CM | POA: Diagnosis not present

## 2019-10-18 DIAGNOSIS — E1169 Type 2 diabetes mellitus with other specified complication: Secondary | ICD-10-CM | POA: Diagnosis not present

## 2019-10-25 DIAGNOSIS — E782 Mixed hyperlipidemia: Secondary | ICD-10-CM | POA: Diagnosis not present

## 2019-10-25 DIAGNOSIS — E1169 Type 2 diabetes mellitus with other specified complication: Secondary | ICD-10-CM | POA: Diagnosis not present

## 2019-10-25 DIAGNOSIS — N1832 Chronic kidney disease, stage 3b: Secondary | ICD-10-CM | POA: Diagnosis not present

## 2019-10-25 DIAGNOSIS — E1142 Type 2 diabetes mellitus with diabetic polyneuropathy: Secondary | ICD-10-CM | POA: Diagnosis not present

## 2019-10-25 DIAGNOSIS — G609 Hereditary and idiopathic neuropathy, unspecified: Secondary | ICD-10-CM | POA: Diagnosis not present

## 2019-10-25 DIAGNOSIS — I129 Hypertensive chronic kidney disease with stage 1 through stage 4 chronic kidney disease, or unspecified chronic kidney disease: Secondary | ICD-10-CM | POA: Diagnosis not present

## 2019-10-25 DIAGNOSIS — Z0000 Encounter for general adult medical examination without abnormal findings: Secondary | ICD-10-CM | POA: Diagnosis not present

## 2019-10-25 DIAGNOSIS — I2510 Atherosclerotic heart disease of native coronary artery without angina pectoris: Secondary | ICD-10-CM | POA: Diagnosis not present

## 2019-10-25 DIAGNOSIS — C50912 Malignant neoplasm of unspecified site of left female breast: Secondary | ICD-10-CM | POA: Diagnosis not present

## 2019-10-25 DIAGNOSIS — E039 Hypothyroidism, unspecified: Secondary | ICD-10-CM | POA: Diagnosis not present

## 2019-11-08 DIAGNOSIS — Z0000 Encounter for general adult medical examination without abnormal findings: Secondary | ICD-10-CM | POA: Diagnosis not present

## 2019-11-09 ENCOUNTER — Other Ambulatory Visit: Payer: Self-pay

## 2019-11-09 ENCOUNTER — Encounter: Payer: Self-pay | Admitting: Podiatry

## 2019-11-09 ENCOUNTER — Ambulatory Visit: Payer: Medicare HMO | Admitting: Podiatry

## 2019-11-09 VITALS — Temp 96.8°F

## 2019-11-09 DIAGNOSIS — E1142 Type 2 diabetes mellitus with diabetic polyneuropathy: Secondary | ICD-10-CM

## 2019-11-09 DIAGNOSIS — M79675 Pain in left toe(s): Secondary | ICD-10-CM

## 2019-11-09 DIAGNOSIS — M79674 Pain in right toe(s): Secondary | ICD-10-CM | POA: Diagnosis not present

## 2019-11-09 DIAGNOSIS — B351 Tinea unguium: Secondary | ICD-10-CM

## 2019-11-09 DIAGNOSIS — N186 End stage renal disease: Secondary | ICD-10-CM

## 2019-11-09 NOTE — Progress Notes (Signed)
This patient returns to my office for at risk foot care.  This patient requires this care by a professional since this patient will be at risk due to having  Diabetes and  ESRD.  This patient presents to the office with her daughter. This patient is unable to cut nails themselves since the patient cannot reach their nails.These nails are painful walking and wearing shoes.  This patient presents for at risk foot care today.  General Appearance  Alert, conversant and in no acute stress.  Vascular  Dorsalis pedis and posterior tibial  pulses are weakly  palpable  bilaterally.  Capillary return is within normal limits  bilaterally. Temperature is within normal limits  bilaterally.  Neurologic  Senn-Weinstein monofilament wire test diminished   bilaterally. Muscle power within normal limits bilaterally.  Nails Thick disfigured discolored nails with subungual debris  from hallux to fifth toes bilaterally. No evidence of bacterial infection or drainage bilaterally.  Orthopedic  No limitations of motion  feet .  No crepitus or effusions noted.  No bony pathology or digital deformities noted.  Skin  normotropic skin with no porokeratosis noted bilaterally.  No signs of infections or ulcers noted.     Onychomycosis  Pain in right toe  Pain in left toe.  Consent was obtained for treatment procedures.  Debridement and grinding of long thick nails with clearing of subungual debris.  No infection or ulcer.     Return office visit   4 months.       Told patient to return for periodic foot care and evaluation due to potential at risk complications.   Gardiner Barefoot DPM

## 2019-11-17 DIAGNOSIS — R011 Cardiac murmur, unspecified: Secondary | ICD-10-CM | POA: Diagnosis not present

## 2020-03-07 ENCOUNTER — Ambulatory Visit: Payer: Medicare HMO | Admitting: Podiatry

## 2020-03-07 ENCOUNTER — Encounter: Payer: Self-pay | Admitting: Podiatry

## 2020-03-07 ENCOUNTER — Other Ambulatory Visit: Payer: Self-pay

## 2020-03-07 DIAGNOSIS — N186 End stage renal disease: Secondary | ICD-10-CM

## 2020-03-07 DIAGNOSIS — B351 Tinea unguium: Secondary | ICD-10-CM | POA: Diagnosis not present

## 2020-03-07 DIAGNOSIS — E1142 Type 2 diabetes mellitus with diabetic polyneuropathy: Secondary | ICD-10-CM

## 2020-03-07 DIAGNOSIS — M79674 Pain in right toe(s): Secondary | ICD-10-CM | POA: Diagnosis not present

## 2020-03-07 DIAGNOSIS — M79675 Pain in left toe(s): Secondary | ICD-10-CM

## 2020-03-07 NOTE — Progress Notes (Signed)
This patient returns to my office for at risk foot care.  This patient requires this care by a professional since this patient will be at risk due to having  Diabetes and  ESRD.  This patient presents to the office with her daughter.  This patient is unable to cut nails herself  since the patient cannot reach her  nails.These nails are painful walking and wearing shoes.  This patient presents for at risk foot care today.  General Appearance  Alert, conversant and in no acute stress.  Vascular  Dorsalis pedis and posterior tibial  pulses are weakly  palpable  bilaterally.  Capillary return is within normal limits  bilaterally. Temperature is within normal limits  bilaterally.  Neurologic  Senn-Weinstein monofilament wire test diminished   bilaterally. Muscle power within normal limits bilaterally.  Nails Thick disfigured discolored nails with subungual debris  from hallux to fifth toes bilaterally. No evidence of bacterial infection or drainage bilaterally.  Orthopedic  No limitations of motion  feet .  No crepitus or effusions noted.  No bony pathology or digital deformities noted.  Skin  normotropic skin with no porokeratosis noted bilaterally.  No signs of infections or ulcers noted.     Onychomycosis  Pain in right toe  Pain in left toe.  Consent was obtained for treatment procedures.  Debridement and grinding of long thick nails with clearing of subungual debris.  No infection or ulcer.     Return office visit   4 months.       Told patient to return for periodic foot care and evaluation due to potential at risk complications.   Gardiner Barefoot DPM

## 2020-04-24 DIAGNOSIS — I129 Hypertensive chronic kidney disease with stage 1 through stage 4 chronic kidney disease, or unspecified chronic kidney disease: Secondary | ICD-10-CM | POA: Diagnosis not present

## 2020-04-24 DIAGNOSIS — E782 Mixed hyperlipidemia: Secondary | ICD-10-CM | POA: Diagnosis not present

## 2020-04-24 DIAGNOSIS — E039 Hypothyroidism, unspecified: Secondary | ICD-10-CM | POA: Diagnosis not present

## 2020-04-24 DIAGNOSIS — E1169 Type 2 diabetes mellitus with other specified complication: Secondary | ICD-10-CM | POA: Diagnosis not present

## 2020-05-01 DIAGNOSIS — R21 Rash and other nonspecific skin eruption: Secondary | ICD-10-CM | POA: Diagnosis not present

## 2020-05-01 DIAGNOSIS — R9431 Abnormal electrocardiogram [ECG] [EKG]: Secondary | ICD-10-CM | POA: Diagnosis not present

## 2020-05-01 DIAGNOSIS — T7840XA Allergy, unspecified, initial encounter: Secondary | ICD-10-CM | POA: Diagnosis not present

## 2020-05-01 DIAGNOSIS — R0602 Shortness of breath: Secondary | ICD-10-CM | POA: Diagnosis not present

## 2020-05-16 ENCOUNTER — Telehealth: Payer: Self-pay | Admitting: General Practice

## 2020-05-16 NOTE — Telephone Encounter (Signed)
Please advise on accepting as a new pt. Her daughter is Lawerance Cruel and she is a pt of your.

## 2020-05-16 NOTE — Progress Notes (Signed)
Patient referred by Merrilee Seashore, MD for abnormal EKG  Subjective:   Sandy Owens, female    DOB: April 30, 1924, 86 y.o.   MRN: [id number]   Chief Complaint  Patient presents with  . Abnormal ECG  . New Patient (Initial Visit)     HPI  86 y.o. Caucasian female with hyperlipidemia, type 2 diabetes mellitus, peripheral neuropathy, CAD s/p CABG shortness of breath, abnormal EKG  Patient is here with her daughter. Patient lives independently. She is able to cook her breakfast by herself-eats oatmeal with added salt. Rest of her activities are assisted by her daughter, who lives 1/4 mile away. Patient has had gradual decline in her functional capacity, especially after a fall in 2019 that caused SAH and scalp hematoma. Over last several months, she has also developed progressive shortness of breath. She is now limited to walking inside the house with her walker, due to gait imbalance, as well as exertional dyspnea. Patient underwent CABGX4 in 2001 by Dr. Roxy Manns due to exertional chest pressure, with cath showing severe multivessel disease. She ha snot had similar symptoms since. Patient also denies orthopnea, PND symptoms.   Two weeks ago, patient had an episode of hives on bilateral arms and legs, associated with shortness of breath. She was evaluated by EMS. Symptoms were self resolving. She had not been on any new medications, diet, soap, detergent etc. Etiology of this episodes remains uncertain.  Past Medical History:  Diagnosis Date  . Breast cancer ([place])    left  . Cancer [facility])    left breast  . Coronary artery disease    CABG - 3- 2001  . Diabetes mellitus without complication ([place])   . High cholesterol   . Hypertension   . Hypothyroidism   . Neuromuscular disorder ([place])    neuropathy in feet  and legs     Past Surgical History:  Procedure Laterality Date  . ABDOMINAL HYSTERECTOMY    . APPENDECTOMY    . BACK SURGERY    . cabg    . CARDIAC SURGERY      Bypass  . HIP SURGERY      right hip replacement  . JOINT REPLACEMENT     left knee replacement  . MASTECTOMY Left   . MULTIPLE EXTRACTIONS WITH ALVEOLOPLASTY N/A 11/28/2017   Procedure: MULTIPLE EXTRACTION WITH ALVEOLOPLASTY;  Surgeon: Diona Browner, DDS;  Location: [place];  Service: Oral Surgery;  Laterality: N/A;  . SIMPLE MASTECTOMY WITH AXILLARY SENTINEL NODE BIOPSY Left 06/06/2014   Procedure: LEFT SIMPLE MASTECTOMY;  Surgeon: Excell Seltzer, MD;  Location: [place];  Service: General;  Laterality: Left;     Social History   Tobacco Use  Smoking Status Former Smoker  Smokeless Tobacco Never Used  Tobacco Comment   quit 40 years ago     Social History   Substance and Sexual Activity  Alcohol Use Yes   Comment: occasional - drinks wine     Family History  Problem Relation Age of Onset  . Heart Problems Mother   . Leukemia Father   . Breast cancer Neg Hx      Current Outpatient Medications on File Prior to Visit  Medication Sig Dispense Refill  . amLODipine (NORVASC) 5 MG tablet TAKE 1 TABLET(5 MG) BY MOUTH DAILY 30 tablet 0  . Artificial Tear Ointment (ARTIFICIAL TEARS) ointment Place 1 application into both eyes as needed (dry eyes).    Marland Kitchen aspirin 81 MG tablet Take 81 mg by mouth  daily.    . atenolol (TENORMIN) 50 MG tablet TAKE 1 TABLET(50 MG) BY MOUTH TWICE DAILY 60 tablet 0  . Calcium Carbonate-Vit D-Min (GNP CALCIUM PLUS 600 +D PO) Take 1 tablet by mouth.    . celecoxib (CELEBREX) 200 MG capsule Take 200 mg by mouth daily.    Marland Kitchen CINNAMON PO Take 2,000 mg by mouth daily.    Marland Kitchen donepezil (ARICEPT) 5 MG tablet Take 5 mg by mouth at bedtime.    . Ibuprofen 200 MG CAPS     . levothyroxine (SYNTHROID, LEVOTHROID) 88 MCG tablet TAKE 1 TABLET(88 MCG) BY MOUTH DAILY 30 tablet 0  . lisinopril (PRINIVIL,ZESTRIL) 5 MG tablet TAKE 1 TABLET(5 MG) BY MOUTH DAILY 30 tablet 0  . Multiple Vitamins-Minerals (PRESERVISION AREDS 2 PO) Take 2 tablets by mouth 2  (two) times daily.    . Omega-3 Fatty Acids (FISH OIL PO) Take 1,400 mg by mouth 2 (two) times daily.     . Incontinence Supply Disposable (BLADDER CONTROL PADS EXTRA) MISC     . Multiple Vitamin (MULTIVITAMIN) tablet Take 1 tablet by mouth daily.    Marland Kitchen omega-3 fish oil (MAXEPA) 1000 MG CAPS capsule      No current facility-administered medications on file prior to visit.    Cardiovascular and other pertinent studies:   EKG 05/01/2020:  Sinus Bradycardia Right Bundle Branch Block Old inferior infarct  Op report 2001 (Dr. Roxy Manns): CABGX4 (LIMA-dLAD, SVG-OM2, seqSVG-PDA/RPLA)  Coronary angiogram 2001: 1.  Severe two-vessel coronary artery disease with critical lesions in the     proximal right coronary artery and mid left anterior descending artery. 2.  Moderate stenosis mid circumflex. 3.  Normal left internal mammary artery. 4.  Normal left ventricular function at rest. 5.  Calcific irregular plaque in the abdominal aorta. 6.  Normal renal arteries. 7.  Successful Perclose of the right femoral artery.   Recent labs: 05/01/2020: Glucose 129, BUN/Cr 22/1.2. EGFR 53. Na/K 136/5.1. ALB: 3.2,  HbA1C 6.6% Chol 219, TG 256, HDL 42, LDL 126   Review of Systems  Cardiovascular: Positive for dyspnea on exertion. Negative for chest pain, leg swelling, palpitations and syncope.  Psychiatric/Behavioral: Positive for memory loss.         Vitals:   05/17/20 1225  BP: (!) 152/59  Pulse: (!) 54  SpO2: 97%     Body mass index is 23.05 kg/m. Filed Weights   05/17/20 1225  Weight: 122 lb (55.3 kg)     Objective:   Physical Exam Vitals and nursing note reviewed.  Constitutional:      General: She is not in acute distress. Neck:     Vascular: No JVD.  Cardiovascular:     Rate and Rhythm: Normal rate and regular rhythm.     Heart sounds: Murmur heard.  Harsh midsystolic murmur is present with a grade of 3/6 at the upper right sternal border radiating to the neck.    Pulmonary:     Effort: Pulmonary effort is normal.     Breath sounds: Normal breath sounds. No wheezing or rales.          Assessment & Recommendations:   86 y.o. Caucasian female with hyperlipidemia, type 2 diabetes mellitus, peripheral neuropathy, CAD s/p CABG shortness of breath, abnormal EKG  Exertional dyspnea, murmur: Etiology multifactorial, includes possible aortic stenosis, CAD, cardiomyopathy. I will obtain echocardiogram. After long discussion with patient and daughter, they would like to pursue conservative medical management, as much as possible. I have counseled re:  low salty diet, avoiding addition of salt to her oatmeal. Given her overall low fluid intake and risk of dehydration, we have decided to hold off diuretic therapy  CAD: S/ CABG. No angina symptoms, but has exertional dyspnea. Continue Aspirin 81 mg. She has had myalgias with statin in the past. Hold off for now, pending further workup.   Echocardiogram and f/u   Thank you for referring the patient to [place]. Please feel free to contact with any questions.   Nigel Mormon, MD Pager: [phone] Office: [phone]

## 2020-05-17 ENCOUNTER — Encounter: Payer: Self-pay | Admitting: Cardiology

## 2020-05-17 ENCOUNTER — Ambulatory Visit: Payer: Medicare HMO | Admitting: Cardiology

## 2020-05-17 ENCOUNTER — Other Ambulatory Visit: Payer: Self-pay

## 2020-05-17 VITALS — BP 152/59 | HR 54 | Ht 61.0 in | Wt 122.0 lb

## 2020-05-17 DIAGNOSIS — R0609 Other forms of dyspnea: Secondary | ICD-10-CM | POA: Diagnosis not present

## 2020-05-17 DIAGNOSIS — R0600 Dyspnea, unspecified: Secondary | ICD-10-CM

## 2020-05-17 DIAGNOSIS — R9431 Abnormal electrocardiogram [ECG] [EKG]: Secondary | ICD-10-CM

## 2020-05-18 NOTE — Telephone Encounter (Signed)
OK. Thx

## 2020-06-06 ENCOUNTER — Ambulatory Visit (INDEPENDENT_AMBULATORY_CARE_PROVIDER_SITE_OTHER): Payer: Medicare HMO | Admitting: Internal Medicine

## 2020-06-06 ENCOUNTER — Other Ambulatory Visit: Payer: Self-pay

## 2020-06-06 ENCOUNTER — Encounter: Payer: Self-pay | Admitting: Internal Medicine

## 2020-06-06 VITALS — BP 158/76 | HR 56 | Temp 98.1°F | Ht 61.0 in | Wt 121.0 lb

## 2020-06-06 DIAGNOSIS — E559 Vitamin D deficiency, unspecified: Secondary | ICD-10-CM | POA: Diagnosis not present

## 2020-06-06 DIAGNOSIS — L509 Urticaria, unspecified: Secondary | ICD-10-CM

## 2020-06-06 DIAGNOSIS — S06301S Unspecified focal traumatic brain injury with loss of consciousness of 30 minutes or less, sequela: Secondary | ICD-10-CM

## 2020-06-06 DIAGNOSIS — I10 Essential (primary) hypertension: Secondary | ICD-10-CM | POA: Diagnosis not present

## 2020-06-06 DIAGNOSIS — E1142 Type 2 diabetes mellitus with diabetic polyneuropathy: Secondary | ICD-10-CM | POA: Diagnosis not present

## 2020-06-06 DIAGNOSIS — Z23 Encounter for immunization: Secondary | ICD-10-CM | POA: Diagnosis not present

## 2020-06-06 MED ORDER — ATENOLOL 50 MG PO TABS
ORAL_TABLET | ORAL | 3 refills | Status: DC
Start: 2020-06-06 — End: 2020-10-26

## 2020-06-06 MED ORDER — LEVOTHYROXINE SODIUM 88 MCG PO TABS: ORAL_TABLET | ORAL | 3 refills | Status: AC

## 2020-06-06 MED ORDER — AMLODIPINE BESYLATE 5 MG PO TABS: ORAL_TABLET | ORAL | 3 refills | Status: AC

## 2020-06-06 MED ORDER — LORATADINE 10 MG PO TABS: 10.0000 mg | ORAL_TABLET | Freq: Every day | ORAL | 11 refills | Status: DC

## 2020-06-06 NOTE — Patient Instructions (Signed)
Claritin daily x 1 month Check BP at home Stop Lisinopril

## 2020-06-06 NOTE — Assessment & Plan Note (Addendum)
D/c ACE (lisinopril) Claritin Check BP at home

## 2020-06-06 NOTE — Assessment & Plan Note (Signed)
On Donepezil 

## 2020-06-06 NOTE — Assessment & Plan Note (Signed)
Labs

## 2020-06-06 NOTE — Progress Notes (Signed)
Subjective:  Patient ID: Sandy Owens, female    DOB: 09-10-1923  Age: 86 y.o. MRN: [id number]  CC: No chief complaint on file.   HPI [facility] presents for hives and SOB x 1 month off and on  x10 episodes. C/o a severe concussion, TBI in 2019 - memory issues then and now F/u HTN, OA, hypothyroidism... New pt visit  Outpatient Medications Prior to Visit  Medication Sig Dispense Refill  . amLODipine (NORVASC) 5 MG tablet TAKE 1 TABLET(5 MG) BY MOUTH DAILY 30 tablet 0  . Artificial Tear Ointment (ARTIFICIAL TEARS) ointment Place 1 application into both eyes as needed (dry eyes).    Marland Kitchen aspirin 81 MG tablet Take 81 mg by mouth daily.    Marland Kitchen atenolol (TENORMIN) 50 MG tablet TAKE 1 TABLET(50 MG) BY MOUTH TWICE DAILY 60 tablet 0  . Calcium Carbonate-Vit D-Min (GNP CALCIUM PLUS 600 +D PO) Take 1 tablet by mouth.    . celecoxib (CELEBREX) 200 MG capsule Take 200 mg by mouth daily.    Marland Kitchen CINNAMON PO Take 2,000 mg by mouth daily.    Marland Kitchen donepezil (ARICEPT) 5 MG tablet Take 5 mg by mouth at bedtime.    . Ibuprofen 200 MG CAPS     . levothyroxine (SYNTHROID, LEVOTHROID) 88 MCG tablet TAKE 1 TABLET(88 MCG) BY MOUTH DAILY 30 tablet 0  . lisinopril (PRINIVIL,ZESTRIL) 5 MG tablet TAKE 1 TABLET(5 MG) BY MOUTH DAILY 30 tablet 0  . Multiple Vitamins-Minerals (PRESERVISION AREDS 2 PO) Take 2 tablets by mouth 2 (two) times daily.    . Omega-3 Fatty Acids (FISH OIL PO) Take 1,400 mg by mouth 2 (two) times daily.      No facility-administered medications prior to visit.    ROS: Review of Systems  Constitutional: Positive for fatigue. Negative for activity change, appetite change, chills and unexpected weight change.  HENT: Negative for congestion, mouth sores and sinus pressure.   Eyes: Negative for visual disturbance.  Respiratory: Positive for cough and shortness of breath. Negative for chest tightness.   Gastrointestinal: Negative for abdominal pain and nausea.  Genitourinary:  Negative for difficulty urinating, frequency and vaginal pain.  Musculoskeletal: Positive for arthralgias and gait problem. Negative for back pain.  Skin: Positive for rash. Negative for pallor.  Neurological: Negative for dizziness, tremors, weakness, numbness and headaches.  Psychiatric/Behavioral: Positive for decreased concentration. Negative for confusion, sleep disturbance and suicidal ideas.    Objective:  BP (!) 158/76 (BP Location: Right Arm, Patient Position: Sitting, Cuff Size: Normal)   Pulse (!) 56   Temp 98.1 F (36.7 C) (Oral)   Ht 5\' 1"  (1.549 m)   Wt 121 lb (54.9 kg)   SpO2 99%   BMI 22.86 kg/m   BP Readings from Last 3 Encounters:  06/06/20 (!) 158/76  05/17/20 (!) 152/59  05/20/18 (!) 161/65    Wt Readings from Last 3 Encounters:  06/06/20 121 lb (54.9 kg)  05/17/20 122 lb (55.3 kg)  05/20/18 138 lb 8 oz (62.8 kg)    Physical Exam Constitutional:      General: She is not in acute distress.    Appearance: She is well-developed. She is obese.  HENT:     Head: Normocephalic.     Right Ear: External ear normal.     Left Ear: External ear normal.     Nose: Nose normal.  Eyes:     General:        Right eye: No discharge.  Left eye: No discharge.     Conjunctiva/sclera: Conjunctivae normal.     Pupils: Pupils are equal, round, and reactive to light.  Neck:     Thyroid: No thyromegaly.     Vascular: No JVD.     Trachea: No tracheal deviation.  Cardiovascular:     Rate and Rhythm: Normal rate and regular rhythm.     Heart sounds: Normal heart sounds.  Pulmonary:     Effort: No respiratory distress.     Breath sounds: No stridor. No wheezing.  Abdominal:     General: Bowel sounds are normal. There is no distension.     Palpations: Abdomen is soft. There is no mass.     Tenderness: There is no abdominal tenderness. There is no guarding or rebound.  Musculoskeletal:        General: No tenderness.     Cervical back: Normal range of motion and  neck supple.  Lymphadenopathy:     Cervical: No cervical adenopathy.  Skin:    Findings: No erythema or rash.  Neurological:     Mental Status: She is oriented to person, place, and time.     Cranial Nerves: No cranial nerve deficit.     Motor: No abnormal muscle tone.     Coordination: Coordination abnormal.     Gait: Gait abnormal.     Deep Tendon Reflexes: Reflexes normal.  Psychiatric:        Behavior: Behavior normal.        Thought Content: Thought content normal.        Judgment: Judgment normal.    Cane  Lab Results  Component Value Date   WBC 12.6 (H) 12/08/2017   HGB 10.3 (L) 12/08/2017   HCT 32.0 (L) 12/08/2017   PLT 360 12/08/2017   GLUCOSE 96 12/12/2017   ALT 30 12/04/2017   AST 42 (H) 12/04/2017   NA 125 (L) 12/12/2017   K 4.8 12/12/2017   CL 91 (L) 12/12/2017   CREATININE 1.01 (H) 12/12/2017   BUN 20 12/12/2017   CO2 26 12/12/2017   INR 1.06 11/26/2017    MM 3D SCREEN BREAST UNI RIGHT  Result Date: 08/09/2019 CLINICAL DATA:  Screening. EXAM: DIGITAL SCREENING UNILATERAL RIGHT MAMMOGRAM WITH CAD AND TOMO COMPARISON:  Previous exam(s). ACR Breast Density Category c: The breast tissue is heterogeneously dense, which may obscure small masses. FINDINGS: The patient has had a left mastectomy. There are no findings suspicious for malignancy. Images were processed with CAD. IMPRESSION: No mammographic evidence of malignancy. A result letter of this screening mammogram will be mailed directly to the patient. RECOMMENDATION: Screening mammogram in one year.  (Code:SM-R-53M) BI-RADS CATEGORY  1: Negative. Electronically Signed   By: Kristopher Oppenheim M.D.   On: 08/09/2019 12:50    Assessment & Plan:   There are no diagnoses linked to this encounter.    Follow-up: No follow-ups on file.  Walker Kehr, MD

## 2020-06-06 NOTE — Assessment & Plan Note (Signed)
D/c Lisinopril due to hives

## 2020-06-07 ENCOUNTER — Ambulatory Visit: Payer: Medicare HMO

## 2020-06-07 ENCOUNTER — Other Ambulatory Visit: Payer: Self-pay | Admitting: Internal Medicine

## 2020-06-07 ENCOUNTER — Ambulatory Visit: Payer: Medicare HMO | Admitting: Cardiology

## 2020-06-07 ENCOUNTER — Encounter: Payer: Self-pay | Admitting: Cardiology

## 2020-06-07 VITALS — BP 172/56 | HR 58 | Resp 16 | Ht 61.0 in | Wt 122.0 lb

## 2020-06-07 DIAGNOSIS — I350 Nonrheumatic aortic (valve) stenosis: Secondary | ICD-10-CM | POA: Insufficient documentation

## 2020-06-07 DIAGNOSIS — R0609 Other forms of dyspnea: Secondary | ICD-10-CM | POA: Diagnosis not present

## 2020-06-07 DIAGNOSIS — I10 Essential (primary) hypertension: Secondary | ICD-10-CM

## 2020-06-07 DIAGNOSIS — R9431 Abnormal electrocardiogram [ECG] [EKG]: Secondary | ICD-10-CM | POA: Diagnosis not present

## 2020-06-07 DIAGNOSIS — R0600 Dyspnea, unspecified: Secondary | ICD-10-CM

## 2020-06-07 LAB — COMPREHENSIVE METABOLIC PANEL
ALT: 22 U/L (ref 0–35)
AST: 39 U/L — ABNORMAL HIGH (ref 0–37)
Albumin: 3.8 g/dL (ref 3.5–5.2)
Alkaline Phosphatase: 92 U/L (ref 39–117)
BUN: 24 mg/dL — ABNORMAL HIGH (ref 6–23)
CO2: 24 mEq/L (ref 19–32)
Calcium: 9.9 mg/dL (ref 8.4–10.5)
Chloride: 94 mEq/L — ABNORMAL LOW (ref 96–112)
Creatinine, Ser: 1.23 mg/dL — ABNORMAL HIGH (ref 0.40–1.20)
GFR: 37.32 mL/min — ABNORMAL LOW (ref >60.00)
Glucose, Bld: 98 mg/dL (ref 70–99)
Potassium: 5.1 mEq/L (ref 3.5–5.1)
Sodium: 131 mEq/L — ABNORMAL LOW (ref 135–145)
Total Bilirubin: 0.4 mg/dL (ref 0.2–1.2)
Total Protein: 7.4 g/dL (ref 6.0–8.3)

## 2020-06-07 LAB — TSH: TSH: 0.95 u[IU]/mL (ref 0.35–4.50)

## 2020-06-07 LAB — VITAMIN D 25 HYDROXY (VIT D DEFICIENCY, FRACTURES): VITD: 29.41 ng/mL — ABNORMAL LOW (ref 30.00–100.00)

## 2020-06-07 MED ORDER — VITAMIN D3 1.25 MG (50000 UT) PO CAPS: 1.0000 | ORAL_CAPSULE | ORAL | 0 refills | Status: DC

## 2020-06-07 NOTE — Progress Notes (Signed)
Patient referred by Merrilee Seashore, MD for abnormal EKG  Subjective:   Sandy Owens, female    DOB: [birth year], 86 y.o.   MRN: [id number]   Chief Complaint  Patient presents with  . Coronary Artery Disease  . Follow-up  . Results    echo  . Abnormal ECG     HPI  86 y.o. Caucasian female with hyperlipidemia, type 2 diabetes mellitus, peripheral neuropathy, CAD s/p CABG shortness of breath, abnormal EKG  Patient is here with her daughter. She underwent echocardiogram today, details below. Overall, she is feeling well, denies any over shortness of breath or syncope. She has generalized fatigue and forgetfulness. Recently, she switched PCP. Her lisinopril was stopped due to concern of rash and itching. BP is elevated today.   Initial consultation HPI 05/2020: Patient is here with her daughter. Patient lives independently. She is able to cook her breakfast by herself-eats oatmeal with added salt. Rest of her activities are assisted by her daughter, who lives 1/4 mile away. Patient has had gradual decline in her functional capacity, especially after a fall in 2019 that caused SAH and scalp hematoma. Over last several months, she has also developed progressive shortness of breath. She is now limited to walking inside the house with her walker, due to gait imbalance, as well as exertional dyspnea. Patient underwent CABGX4 in 2001 by Dr. Roxy Manns due to exertional chest pressure, with cath showing severe multivessel disease. She ha snot had similar symptoms since. Patient also denies orthopnea, PND symptoms.   Two weeks ago, patient had an episode of hives on bilateral arms and legs, associated with shortness of breath. She was evaluated by EMS. Symptoms were self resolving. She had not been on any new medications, diet, soap, detergent etc. Etiology of this episodes remains uncertain.    Current Outpatient Medications on File Prior to Visit  Medication Sig Dispense Refill  .  amLODipine (NORVASC) 5 MG tablet TAKE 1 TABLET(5 MG) BY MOUTH DAILY 90 tablet 3  . Artificial Tear Ointment (ARTIFICIAL TEARS) ointment Place 1 application into both eyes as needed (dry eyes).    Marland Kitchen aspirin 81 MG tablet Take 81 mg by mouth daily.    Marland Kitchen atenolol (TENORMIN) 50 MG tablet TAKE 1 TABLET(50 MG) BY MOUTH TWICE DAILY 180 tablet 3  . Calcium Carbonate-Vit D-Min (GNP CALCIUM PLUS 600 +D PO) Take 1 tablet by mouth.    . celecoxib (CELEBREX) 200 MG capsule Take 200 mg by mouth daily.    Marland Kitchen CINNAMON PO Take 2,000 mg by mouth daily.    Marland Kitchen donepezil (ARICEPT) 5 MG tablet Take 5 mg by mouth at bedtime.    . Ibuprofen 200 MG CAPS     . levothyroxine (SYNTHROID) 88 MCG tablet TAKE 1 TABLET(88 MCG) BY MOUTH DAILY 90 tablet 3  . loratadine (CLARITIN) 10 MG tablet Take 1 tablet (10 mg total) by mouth daily. 30 tablet 11  . Multiple Vitamins-Minerals (PRESERVISION AREDS 2 PO) Take 2 tablets by mouth 2 (two) times daily.    . Omega-3 Fatty Acids (FISH OIL PO) Take 1,400 mg by mouth 2 (two) times daily.      No current facility-administered medications on file prior to visit.    Cardiovascular and other pertinent studies:  EKG 06/07/2020: Sinus rhythm 55 bpm Right bundle branch block  Echocardiogram 06/07/2020: Left ventricle cavity is normal in size and wall thickness. Normal LV systolic function with visual EF 55-60%. Normal global wall motion. Doppler evidence of grade  II (pseudonormal) diastolic dysfunction, elevated LAP.  Left atrial cavity is mildly dilated. Trileaflet aortic valve with moderate calcification of the aortic valve annulus and leaflets.  Moderate aortic valve stenosis. Vmax 3.1 m/sec, mean PG 21 mmHg, AVA 1 cm2, AVAi0.65 cm2/m2. Dimensionless index 0.39.  Mild tricuspid regurgitation.  No evidence of pulmonary hypertension.  EKG 05/01/2020:  Sinus Bradycardia Right Bundle Branch Block Old inferior infarct  Op report 2001 (Dr. Roxy Manns): CABGX4 (LIMA-dLAD, SVG-OM2,  seqSVG-PDA/RPLA)  Coronary angiogram 2001: 1.  Severe two-vessel coronary artery disease with critical lesions in the     proximal right coronary artery and mid left anterior descending artery. 2.  Moderate stenosis mid circumflex. 3.  Normal left internal mammary artery. 4.  Normal left ventricular function at rest. 5.  Calcific irregular plaque in the abdominal aorta. 6.  Normal renal arteries. 7.  Successful Perclose of the right femoral artery.   Recent labs: 05/01/2020: Glucose 129, BUN/Cr 22/1.2. EGFR 53. Na/K 136/5.1. ALB: 3.2,  HbA1C 6.6% Chol 219, TG 256, HDL 42, LDL 126   Review of Systems  Cardiovascular: Positive for dyspnea on exertion. Negative for chest pain, leg swelling, palpitations and syncope.  Psychiatric/Behavioral: Positive for memory loss.         Vitals:   06/07/20 1304  BP: (!) 172/56  Pulse: (!) 58  Resp: 16  SpO2: 96%     Body mass index is 23.05 kg/m. Filed Weights   06/07/20 1304  Weight: 122 lb (55.3 kg)     Objective:   Physical Exam Vitals and nursing note reviewed.  Constitutional:      General: She is not in acute distress. Neck:     Vascular: No JVD.  Cardiovascular:     Rate and Rhythm: Normal rate and regular rhythm.     Heart sounds: Murmur heard.  Harsh midsystolic murmur is present with a grade of 3/6 at the upper right sternal border radiating to the neck.   Pulmonary:     Effort: Pulmonary effort is normal.     Breath sounds: Normal breath sounds. No wheezing or rales.          Assessment & Recommendations:   86 y.o. Caucasian female with hyperlipidemia, type 2 diabetes mellitus, peripheral neuropathy, CAD s/p CABG shortness of breath, abnormal EKG  Exertional dyspnea, murmur: Moderate aortic stenosis, grade II diastolic dysfunction. Hypertension likely contributing. I do not think she needs aortic valve replacement at this time. Continue current antihypertensive therapy.  Arranged for remote patient  monitoring through pur pharmacist Manuela Schwartz. May need further adjustment of antihypertensive therapy, depending on home monitoring data.   CAD: S/ CABG. No angina symptoms, but has exertional dyspnea. Continue Aspirin 81 mg. She has had myalgias with statin in the past. Wants to hold off statin. Given her advanced age, this is reasonable.  She doe snot want to pursue ischemia management, as she is not interested in invasive management, as it is required.   F/u in 1 year after echocardiogram    Nigel Mormon, MD Pager: [phone] Office: [phone]

## 2020-07-02 DIAGNOSIS — I10 Essential (primary) hypertension: Secondary | ICD-10-CM | POA: Diagnosis not present

## 2020-07-07 ENCOUNTER — Other Ambulatory Visit: Payer: Self-pay | Admitting: Internal Medicine

## 2020-07-07 DIAGNOSIS — Z1231 Encounter for screening mammogram for malignant neoplasm of breast: Secondary | ICD-10-CM

## 2020-07-11 ENCOUNTER — Other Ambulatory Visit: Payer: Self-pay

## 2020-07-11 ENCOUNTER — Ambulatory Visit: Payer: Medicare HMO | Admitting: Podiatry

## 2020-07-11 ENCOUNTER — Encounter: Payer: Self-pay | Admitting: Podiatry

## 2020-07-11 DIAGNOSIS — N186 End stage renal disease: Secondary | ICD-10-CM

## 2020-07-11 DIAGNOSIS — B351 Tinea unguium: Secondary | ICD-10-CM

## 2020-07-11 DIAGNOSIS — E1142 Type 2 diabetes mellitus with diabetic polyneuropathy: Secondary | ICD-10-CM | POA: Diagnosis not present

## 2020-07-11 DIAGNOSIS — M79674 Pain in right toe(s): Secondary | ICD-10-CM

## 2020-07-11 DIAGNOSIS — M79675 Pain in left toe(s): Secondary | ICD-10-CM

## 2020-07-11 NOTE — Progress Notes (Signed)
This patient returns to my office for at risk foot care.  This patient requires this care by a professional since this patient will be at risk due to having  Diabetes and  ESRD.  This patient presents to the office with her son-in-law.  This patient is unable to cut nails herself  since the patient cannot reach her  nails.These nails are painful walking and wearing shoes.  This patient presents for at risk foot care today.  General Appearance  Alert, conversant and in no acute stress.  Vascular  Dorsalis pedis and posterior tibial  pulses are weakly  palpable  bilaterally.  Capillary return is within normal limits  bilaterally. Temperature is within normal limits  bilaterally.  Neurologic  Senn-Weinstein monofilament wire test diminished   bilaterally. Muscle power within normal limits bilaterally.  Nails Thick disfigured discolored nails with subungual debris  from hallux to fifth toes bilaterally. No evidence of bacterial infection or drainage bilaterally.  Orthopedic  No limitations of motion  feet .  No crepitus or effusions noted.  No bony pathology or digital deformities noted.  Skin  normotropic skin with no porokeratosis noted bilaterally.  No signs of infections or ulcers noted.     Onychomycosis  Pain in right toe  Pain in left toe.  Consent was obtained for treatment procedures.  Debridement and grinding of long thick nails with clearing of subungual debris.  No infection or ulcer.     Return office visit   4 months.       Told patient to return for periodic foot care and evaluation due to potential at risk complications.   Gardiner Barefoot DPM

## 2020-07-18 ENCOUNTER — Encounter: Payer: Self-pay | Admitting: Internal Medicine

## 2020-07-18 ENCOUNTER — Other Ambulatory Visit: Payer: Self-pay

## 2020-07-18 ENCOUNTER — Ambulatory Visit (INDEPENDENT_AMBULATORY_CARE_PROVIDER_SITE_OTHER): Payer: Medicare HMO | Admitting: Internal Medicine

## 2020-07-18 DIAGNOSIS — I609 Nontraumatic subarachnoid hemorrhage, unspecified: Secondary | ICD-10-CM | POA: Diagnosis not present

## 2020-07-18 DIAGNOSIS — E1142 Type 2 diabetes mellitus with diabetic polyneuropathy: Secondary | ICD-10-CM | POA: Diagnosis not present

## 2020-07-18 DIAGNOSIS — I10 Essential (primary) hypertension: Secondary | ICD-10-CM

## 2020-07-18 DIAGNOSIS — R413 Other amnesia: Secondary | ICD-10-CM | POA: Diagnosis not present

## 2020-07-18 MED ORDER — VITAMIN D3 50 MCG (2000 UT) PO CAPS: 2000.0000 [IU] | ORAL_CAPSULE | Freq: Every day | ORAL | 3 refills | Status: AC

## 2020-07-18 NOTE — Progress Notes (Signed)
Subjective:  Patient ID: Sandy Owens, female    DOB: Aug 06, 1924  Age: 86 y.o. MRN: [id number]  CC: Follow-up (6 week f/u)   HPI [facility] presents for hives and episodes of SOB - resolved off Lisinopril F/u HTN  Outpatient Medications Prior to Visit  Medication Sig Dispense Refill  . amLODipine (NORVASC) 5 MG tablet TAKE 1 TABLET(5 MG) BY MOUTH DAILY 90 tablet 3  . Artificial Tear Ointment (ARTIFICIAL TEARS) ointment Place 1 application into both eyes as needed (dry eyes).    Marland Kitchen aspirin 81 MG tablet Take 81 mg by mouth daily.    Marland Kitchen atenolol (TENORMIN) 50 MG tablet TAKE 1 TABLET(50 MG) BY MOUTH TWICE DAILY 180 tablet 3  . Calcium Carbonate-Vit D-Min (GNP CALCIUM PLUS 600 +D PO) Take 1 tablet by mouth.    . celecoxib (CELEBREX) 200 MG capsule Take 200 mg by mouth daily.    . Cholecalciferol (VITAMIN D3) 1.25 MG (50000 UT) CAPS Take 1 capsule by mouth once a week. 6 capsule 0  . CINNAMON PO Take 2,000 mg by mouth daily.    Marland Kitchen donepezil (ARICEPT) 5 MG tablet Take 5 mg by mouth at bedtime.    . Ibuprofen 200 MG CAPS     . levothyroxine (SYNTHROID) 88 MCG tablet TAKE 1 TABLET(88 MCG) BY MOUTH DAILY 90 tablet 3  . Omega-3 Fatty Acids (FISH OIL PO) Take 1,400 mg by mouth 2 (two) times daily.     Marland Kitchen loratadine (CLARITIN) 10 MG tablet Take 1 tablet (10 mg total) by mouth daily. (Patient not taking: Reported on 07/18/2020) 30 tablet 11   No facility-administered medications prior to visit.    ROS: Review of Systems  Constitutional: Negative for activity change, appetite change, chills, fatigue and unexpected weight change.  HENT: Negative for congestion, mouth sores and sinus pressure.   Eyes: Negative for visual disturbance.  Respiratory: Negative for cough and chest tightness.   Gastrointestinal: Negative for abdominal pain and nausea.  Genitourinary: Negative for difficulty urinating, frequency and vaginal pain.  Musculoskeletal: Positive for gait problem. Negative  for back pain.  Skin: Negative for pallor and rash.  Neurological: Negative for dizziness, tremors, weakness, numbness and headaches.  Psychiatric/Behavioral: Negative for confusion and sleep disturbance.    Objective:  BP (!) 146/60 (BP Location: Left Arm)   Pulse (!) 55   Temp 98.3 F (36.8 C) (Oral)   Wt 126 lb 6.4 oz (57.3 kg)   SpO2 99%   BMI 23.88 kg/m   BP Readings from Last 3 Encounters:  07/18/20 (!) 146/60  06/07/20 (!) 172/56  06/06/20 (!) 158/76    Wt Readings from Last 3 Encounters:  07/18/20 126 lb 6.4 oz (57.3 kg)  06/07/20 122 lb (55.3 kg)  06/06/20 121 lb (54.9 kg)    Physical Exam Constitutional:      General: She is not in acute distress.    Appearance: She is well-developed.  HENT:     Head: Normocephalic.     Right Ear: External ear normal.     Left Ear: External ear normal.     Nose: Nose normal.  Eyes:     General:        Right eye: No discharge.        Left eye: No discharge.     Conjunctiva/sclera: Conjunctivae normal.     Pupils: Pupils are equal, round, and reactive to light.  Neck:     Thyroid: No thyromegaly.     Vascular:  No JVD.     Trachea: No tracheal deviation.  Cardiovascular:     Rate and Rhythm: Normal rate and regular rhythm.     Heart sounds: Murmur heard.   Pulmonary:     Effort: No respiratory distress.     Breath sounds: No stridor. No wheezing.  Abdominal:     General: Bowel sounds are normal. There is no distension.     Palpations: Abdomen is soft. There is no mass.     Tenderness: There is no abdominal tenderness. There is no guarding or rebound.  Musculoskeletal:        General: No tenderness.     Cervical back: Normal range of motion and neck supple.  Lymphadenopathy:     Cervical: No cervical adenopathy.  Skin:    Findings: No erythema or rash.  Neurological:     Mental Status: She is oriented to person, place, and time.     Cranial Nerves: No cranial nerve deficit.     Motor: No abnormal muscle tone.      Coordination: Coordination abnormal.     Deep Tendon Reflexes: Reflexes normal.  Psychiatric:        Behavior: Behavior normal.        Thought Content: Thought content normal.        Judgment: Judgment normal.   cane  Lab Results  Component Value Date   WBC 12.6 (H) 12/08/2017   HGB 10.3 (L) 12/08/2017   HCT 32.0 (L) 12/08/2017   PLT 360 12/08/2017   GLUCOSE 98 06/06/2020   ALT 22 06/06/2020   AST 39 (H) 06/06/2020   NA 131 (L) 06/06/2020   K 5.1 06/06/2020   CL 94 (L) 06/06/2020   CREATININE 1.23 (H) 06/06/2020   BUN 24 (H) 06/06/2020   CO2 24 06/06/2020   TSH 0.95 06/06/2020   INR 1.06 11/26/2017    MM 3D SCREEN BREAST UNI RIGHT  Result Date: 08/09/2019 CLINICAL DATA:  Screening. EXAM: DIGITAL SCREENING UNILATERAL RIGHT MAMMOGRAM WITH CAD AND TOMO COMPARISON:  Previous exam(s). ACR Breast Density Category c: The breast tissue is heterogeneously dense, which may obscure small masses. FINDINGS: The patient has had a left mastectomy. There are no findings suspicious for malignancy. Images were processed with CAD. IMPRESSION: No mammographic evidence of malignancy. A result letter of this screening mammogram will be mailed directly to the patient. RECOMMENDATION: Screening mammogram in one year.  (Code:SM-R-15M) BI-RADS CATEGORY  1: Negative. Electronically Signed   By: Kristopher Oppenheim M.D.   On: 08/09/2019 12:50    Assessment & Plan:   There are no diagnoses linked to this encounter.   No orders of the defined types were placed in this encounter.    Follow-up: No follow-ups on file.  Walker Kehr, MD

## 2020-07-18 NOTE — Patient Instructions (Signed)
You can try Lion's Mane Mushroom extract or capsules for memory   

## 2020-07-18 NOTE — Assessment & Plan Note (Signed)
Aricept Try Lion's mane supplement

## 2020-07-18 NOTE — Assessment & Plan Note (Signed)
Off Lisinopril Atenolol, amlodipine

## 2020-07-18 NOTE — Assessment & Plan Note (Signed)
Labs next time

## 2020-07-18 NOTE — Assessment & Plan Note (Signed)
Try Lion's mane supplement

## 2020-07-19 ENCOUNTER — Other Ambulatory Visit: Payer: Self-pay | Admitting: *Deleted

## 2020-07-19 MED ORDER — CELECOXIB 200 MG PO CAPS
200.0000 mg | ORAL_CAPSULE | Freq: Every day | ORAL | 3 refills | Status: DC
Start: 2020-07-19 — End: 2020-10-04

## 2020-07-19 MED ORDER — DONEPEZIL HCL 5 MG PO TABS: 5.0000 mg | ORAL_TABLET | Freq: Every day | ORAL | 3 refills | Status: AC

## 2020-08-01 DIAGNOSIS — I10 Essential (primary) hypertension: Secondary | ICD-10-CM | POA: Diagnosis not present

## 2020-08-17 ENCOUNTER — Ambulatory Visit: Payer: Medicare HMO

## 2020-09-01 DIAGNOSIS — I10 Essential (primary) hypertension: Secondary | ICD-10-CM | POA: Diagnosis not present

## 2020-09-04 ENCOUNTER — Ambulatory Visit: Payer: Medicare HMO | Admitting: Internal Medicine

## 2020-09-06 ENCOUNTER — Encounter: Payer: Self-pay | Admitting: Internal Medicine

## 2020-09-06 ENCOUNTER — Other Ambulatory Visit: Payer: Self-pay

## 2020-09-06 ENCOUNTER — Ambulatory Visit (INDEPENDENT_AMBULATORY_CARE_PROVIDER_SITE_OTHER): Payer: Medicare HMO | Admitting: Internal Medicine

## 2020-09-06 DIAGNOSIS — N1832 Chronic kidney disease, stage 3b: Secondary | ICD-10-CM | POA: Diagnosis not present

## 2020-09-06 DIAGNOSIS — M2550 Pain in unspecified joint: Secondary | ICD-10-CM | POA: Diagnosis not present

## 2020-09-06 DIAGNOSIS — M7062 Trochanteric bursitis, left hip: Secondary | ICD-10-CM

## 2020-09-06 DIAGNOSIS — M7060 Trochanteric bursitis, unspecified hip: Secondary | ICD-10-CM | POA: Insufficient documentation

## 2020-09-06 MED ORDER — METHYLPREDNISOLONE ACETATE 80 MG/ML IJ SUSP
80.0000 mg | Freq: Once | INTRAMUSCULAR | Status: AC
  Administered 2020-09-06: 80 mg via INTRAMUSCULAR

## 2020-09-06 MED ORDER — LIDOCAINE-EPINEPHRINE 2 %-1:100000 IJ SOLN
3.0000 mL | Freq: Once | INTRAMUSCULAR | Status: AC
  Administered 2020-09-06: 3 mL

## 2020-09-06 MED ORDER — METHYLPREDNISOLONE 4 MG PO TBPK: ORAL_TABLET | ORAL | 0 refills | Status: DC

## 2020-09-06 NOTE — Patient Instructions (Addendum)
Hip Bursitis  Hip bursitis is swelling of a fluid-filled sac (bursa) in your hip joint. This swelling (inflammation) can be painful. This condition may come and go over time. What are the causes?  Injury to the hip.  Overuse of the muscles that surround the hip joint.  An earlier injury or surgery of the hip.  Arthritis or gout.  Diabetes.  Thyroid disease.  Infection.  In some cases, the cause may not be known. What are the signs or symptoms?  Mild or moderate pain in the hip area. Pain may get worse with movement.  Tenderness and swelling of the hip, especially on the outer side of the hip.  In rare cases, the bursa may become infected. This may cause: ? A fever. ? Warmth and redness in the area. Symptoms may come and go. How is this treated? This condition is treated by resting, icing, applying pressure (compression), and raising (elevating) the injured area. You may hear this called the RICE treatment. Treatment may also include:  Using crutches.  Draining fluid out of the bursa to help relieve swelling.  Giving a shot of (injecting) medicine that helps to reduce swelling (cortisone).  Other medicines if the bursa is infected. Follow these instructions at home: Managing pain, stiffness, and swelling   If told, put ice on the painful area. ? Put ice in a plastic bag. ? Place a towel between your skin and the bag. ? Leave the ice on for 20 minutes, 2-3 times a day. ? Raise (elevate) your hip above the level of your heart as much as you can without pain. To do this, try putting a pillow under your hips while you lie down. Stop if this causes pain. Activity  Return to your normal activities as told by your doctor. Ask your doctor what activities are safe for you.  Rest and protect your hip as much as you can until you feel better. General instructions  Take over-the-counter and prescription medicines only as told by your doctor.  Wear wraps that put pressure  on your hip (compression wraps) only as told by your doctor.  Do not use your hip to support your body weight until your doctor says that you can.  Use crutches as told by your doctor.  Gently rub and stretch your injured area as often as is comfortable.  Keep all follow-up visits as told by your doctor. This is important. How is this prevented?  Exercise regularly, as told by your doctor.  Warm up and stretch before being active.  Cool down and stretch after being active.  Avoid activities that bother your hip or cause pain.  Avoid sitting down for long periods at a time. Contact a doctor if:  You have a fever.  You get new symptoms.  You have trouble walking.  You have trouble doing everyday activities.  You have pain that gets worse.  You have pain that does not get better with medicine.  You get red skin on your hip area.  You get a feeling of warmth in your hip area. Get help right away if:  You cannot move your hip.  You have very bad pain. Summary  Hip bursitis is swelling of a fluid-filled sac (bursa) in your hip.  Hip bursitis can be painful.  Symptoms often come and go over time.  This condition is treated with rest, ice, compression, elevation, and medicines. This information is not intended to replace advice given to you by your health care provider.   Make sure you discuss any questions you have with your health care provider. Document Revised: 04/27/2018 Document Reviewed: 04/27/2018 Elsevier Patient Education  [employer].    Postprocedure instructions :    A Band-Aid should be left on for 12 hours. Injection therapy is not a cure itself. It is used in conjunction with other modalities. You can use nonsteroidal anti-inflammatories like ibuprofen , hot and cold compresses. Rest is recommended in the next 24 hours. You need to report immediately  if fever, chills or any signs of infection develop.

## 2020-09-06 NOTE — Progress Notes (Signed)
Subjective:  Patient ID: Sandy Owens, female    DOB: 08-05-1924  Age: 87 y.o. MRN: [id number]  CC: Leg Pain (Daugghter states shoulder little better, but having pain in her leg. Pt can not walk. Pt states she just hurt all over)   HPI [facility] presents for severe pain in the LLE/hip - can't walk or stand without help for 2 days, Pain in the shoulders R>L.  She has been sleeping/living in her couch.  She is a poor historian due to memory issues.  Her daughter is with her helping out with history.  The patient has been taking a lot of Advil lately.  She has been taking Celebrex daily as well.  Outpatient Medications Prior to Visit  Medication Sig Dispense Refill  . amLODipine (NORVASC) 5 MG tablet TAKE 1 TABLET(5 MG) BY MOUTH DAILY 90 tablet 3  . Artificial Tear Ointment (ARTIFICIAL TEARS) ointment Place 1 application into both eyes as needed (dry eyes).    Marland Kitchen aspirin 81 MG tablet Take 81 mg by mouth daily.    Marland Kitchen atenolol (TENORMIN) 50 MG tablet TAKE 1 TABLET(50 MG) BY MOUTH TWICE DAILY 180 tablet 3  . Calcium Carbonate-Vit D-Min (GNP CALCIUM PLUS 600 +D PO) Take 1 tablet by mouth.    . celecoxib (CELEBREX) 200 MG capsule Take 1 capsule (200 mg total) by mouth daily. 90 capsule 3  . Cholecalciferol (VITAMIN D3) 50 MCG (2000 UT) capsule Take 1 capsule (2,000 Units total) by mouth daily. 100 capsule 3  . CINNAMON PO Take 2,000 mg by mouth daily.    Marland Kitchen donepezil (ARICEPT) 5 MG tablet Take 1 tablet (5 mg total) by mouth at bedtime. 90 tablet 3  . Ibuprofen 200 MG CAPS     . levothyroxine (SYNTHROID) 88 MCG tablet TAKE 1 TABLET(88 MCG) BY MOUTH DAILY 90 tablet 3  . Omega-3 Fatty Acids (FISH OIL PO) Take 1,400 mg by mouth 2 (two) times daily.      No facility-administered medications prior to visit.    ROS: Review of Systems  Constitutional: Negative for activity change, appetite change, chills, fatigue and unexpected weight change.  HENT: Negative for congestion, mouth  sores and sinus pressure.   Eyes: Negative for visual disturbance.  Respiratory: Negative for cough and chest tightness.   Gastrointestinal: Negative for abdominal pain and nausea.  Genitourinary: Negative for difficulty urinating, frequency and vaginal pain.  Musculoskeletal: Positive for arthralgias, back pain and gait problem.  Skin: Negative for pallor and rash.  Neurological: Positive for weakness. Negative for dizziness, tremors, numbness and headaches.  Psychiatric/Behavioral: Positive for decreased concentration. Negative for confusion and sleep disturbance.    Objective:  BP 138/62 (BP Location: Left Arm)   Pulse (!) 58   Temp 98.2 F (36.8 C) (Oral)   SpO2 97%   BP Readings from Last 3 Encounters:  09/06/20 138/62  07/18/20 (!) 146/60  06/07/20 (!) 172/56    Wt Readings from Last 3 Encounters:  07/18/20 126 lb 6.4 oz (57.3 kg)  06/07/20 122 lb (55.3 kg)  06/06/20 121 lb (54.9 kg)    Physical Exam Constitutional:      General: She is not in acute distress.    Appearance: She is well-developed. She is not ill-appearing.  HENT:     Head: Normocephalic.     Right Ear: External ear normal.     Left Ear: External ear normal.     Nose: Nose normal.     Mouth/Throat:  Mouth: Oropharynx is clear and moist.  Eyes:     General:        Right eye: No discharge.        Left eye: No discharge.     Conjunctiva/sclera: Conjunctivae normal.     Pupils: Pupils are equal, round, and reactive to light.  Neck:     Thyroid: No thyromegaly.     Vascular: No JVD.     Trachea: No tracheal deviation.  Cardiovascular:     Rate and Rhythm: Normal rate and regular rhythm.     Heart sounds: Normal heart sounds.  Pulmonary:     Effort: No respiratory distress.     Breath sounds: No stridor. No wheezing.  Abdominal:     General: Bowel sounds are normal. There is no distension.     Palpations: Abdomen is soft. There is no mass.     Tenderness: There is no abdominal tenderness.  There is no guarding or rebound.  Musculoskeletal:        General: Tenderness present. No edema.     Cervical back: Normal range of motion and neck supple.  Lymphadenopathy:     Cervical: No cervical adenopathy.  Skin:    Findings: No erythema or rash.  Neurological:     Cranial Nerves: No cranial nerve deficit.     Motor: Weakness present. No abnormal muscle tone.     Coordination: Coordination abnormal.     Gait: Gait abnormal.     Deep Tendon Reflexes: Reflexes normal.  Psychiatric:        Mood and Affect: Mood and affect normal.        Behavior: Behavior normal.        Thought Content: Thought content normal.        Judgment: Judgment normal.   The patient is in her wheelchair.  Left hip with a good range of motion.  It is extremely tender in that trochanteric bursa area.  Straight leg elevation is okay.  Both shoulders are tender with range of motion on the right.   Procedure Note :     Procedure : Joint Injection,  L  hip   Indication:  Trochanteric bursitis with refractory pain.   Risks including unsuccessful procedure , bleeding, infection, bruising, skin atrophy, "steroid flare-up" and others were explained to the patient in detail as well as the benefits. Informed consent was obtained verbally.  Tthe patient was placed in a comfortable lateral decubitus position. The point of maximal tenderness was identified. Skin was prepped with Betadine and alcohol. Then, a 5 cc syringe with a 2 inch long 24-gauge needle was used for a bursa injection.. The needle was advanced  Into the bursa. I injected the bursa with 4 mL of 2% lidocaine and 80 mg of Depo-Medrol .  Band-Aid was applied.   Tolerated well. Complications: None. Good pain relief following the procedure.   Postprocedure instructions :    A Band-Aid should be left on for 12 hours. Injection therapy is not a cure itself. It is used in conjunction with other modalities. You can use nonsteroidal anti-inflammatories like  ibuprofen , hot and cold compresses. Rest is recommended in the next 24 hours. You need to report immediately  if fever, chills or any signs of infection develop.    Lab Results  Component Value Date   WBC 12.6 (H) 12/08/2017   HGB 10.3 (L) 12/08/2017   HCT 32.0 (L) 12/08/2017   PLT 360 12/08/2017   GLUCOSE 98 06/06/2020  ALT 22 06/06/2020   AST 39 (H) 06/06/2020   NA 131 (L) 06/06/2020   K 5.1 06/06/2020   CL 94 (L) 06/06/2020   CREATININE 1.23 (H) 06/06/2020   BUN 24 (H) 06/06/2020   CO2 24 06/06/2020   TSH 0.95 06/06/2020   INR 1.06 11/26/2017    MM 3D SCREEN BREAST UNI RIGHT  Result Date: 08/09/2019 CLINICAL DATA:  Screening. EXAM: DIGITAL SCREENING UNILATERAL RIGHT MAMMOGRAM WITH CAD AND TOMO COMPARISON:  Previous exam(s). ACR Breast Density Category c: The breast tissue is heterogeneously dense, which may obscure small masses. FINDINGS: The patient has had a left mastectomy. There are no findings suspicious for malignancy. Images were processed with CAD. IMPRESSION: No mammographic evidence of malignancy. A result letter of this screening mammogram will be mailed directly to the patient. RECOMMENDATION: Screening mammogram in one year.  (Code:SM-R-17M) BI-RADS CATEGORY  1: Negative. Electronically Signed   By: Kristopher Oppenheim M.D.   On: 08/09/2019 12:50    Assessment & Plan:   There are no diagnoses linked to this encounter.   No orders of the defined types were placed in this encounter.    Follow-up: No follow-ups on file.  Walker Kehr, MD

## 2020-09-06 NOTE — Addendum Note (Signed)
Addended by: Earnstine Regal on: 09/06/2020 04:03 PM   Modules accepted: Orders

## 2020-09-06 NOTE — Assessment & Plan Note (Signed)
Multiple joints.  No clear evidence of PMR. Consider low-dose steroids if worse.  We will see if she has a good effect from Medrol pack. She needs to discontinue ibuprofen and Celebrex. She can use Tylenol, Voltaren gel, CBD cream

## 2020-09-06 NOTE — Assessment & Plan Note (Addendum)
New L hip  inj -see procedure Medrol pack Ice/heat Memory foam pad Massage Range of motion exercises

## 2020-09-06 NOTE — Assessment & Plan Note (Signed)
The patient was asked to discontinue Celebrex and ibuprofen.  If needed we may use Tylenol 500 mg twice a day.  We may consider using a low-dose prednisone daily.

## 2020-10-02 DIAGNOSIS — I10 Essential (primary) hypertension: Secondary | ICD-10-CM | POA: Diagnosis not present

## 2020-10-03 ENCOUNTER — Other Ambulatory Visit: Payer: Self-pay

## 2020-10-04 ENCOUNTER — Ambulatory Visit (INDEPENDENT_AMBULATORY_CARE_PROVIDER_SITE_OTHER): Payer: Medicare HMO | Admitting: Internal Medicine

## 2020-10-04 ENCOUNTER — Encounter: Payer: Self-pay | Admitting: Internal Medicine

## 2020-10-04 DIAGNOSIS — N1832 Chronic kidney disease, stage 3b: Secondary | ICD-10-CM | POA: Diagnosis not present

## 2020-10-04 DIAGNOSIS — M2550 Pain in unspecified joint: Secondary | ICD-10-CM

## 2020-10-04 DIAGNOSIS — R413 Other amnesia: Secondary | ICD-10-CM | POA: Diagnosis not present

## 2020-10-04 DIAGNOSIS — E1142 Type 2 diabetes mellitus with diabetic polyneuropathy: Secondary | ICD-10-CM

## 2020-10-04 MED ORDER — PREDNISONE 10 MG PO TABS: ORAL_TABLET | ORAL | 3 refills | Status: DC

## 2020-10-04 NOTE — Assessment & Plan Note (Signed)
Aricept.

## 2020-10-04 NOTE — Assessment & Plan Note (Signed)
D/c Celebrex

## 2020-10-04 NOTE — Assessment & Plan Note (Signed)
Check CBGs on steroids Not on meds

## 2020-10-04 NOTE — Assessment & Plan Note (Addendum)
?  PMR Start Prednisone 10 mg a day  Potential benefits of a long term steroid  use as well as potential risks  and complications were explained to the patient and were aknowledged.

## 2020-10-04 NOTE — Progress Notes (Signed)
Subjective:  Patient ID: Sandy Owens, female    DOB: [birth year]  Age: 87 y.o. MRN: [id number]  CC: Follow-up (4 wks f/u- Daughter states she had to start back giving her Celebrex due to ongoing pain)   HPI [facility] presents for LBP, shoulder pain and stiffness Hip pain is better  Outpatient Medications Prior to Visit  Medication Sig Dispense Refill  . amLODipine (NORVASC) 5 MG tablet TAKE 1 TABLET(5 MG) BY MOUTH DAILY 90 tablet 3  . Artificial Tear Ointment (ARTIFICIAL TEARS) ointment Place 1 application into both eyes as needed (dry eyes).    Marland Kitchen aspirin 81 MG tablet Take 81 mg by mouth daily.    Marland Kitchen atenolol (TENORMIN) 50 MG tablet TAKE 1 TABLET(50 MG) BY MOUTH TWICE DAILY 180 tablet 3  . Calcium Carbonate-Vit D-Min (GNP CALCIUM PLUS 600 +D PO) Take 1 tablet by mouth.    . Cholecalciferol (VITAMIN D3) 50 MCG (2000 UT) capsule Take 1 capsule (2,000 Units total) by mouth daily. 100 capsule 3  . CINNAMON PO Take 2,000 mg by mouth daily.    Marland Kitchen donepezil (ARICEPT) 5 MG tablet Take 1 tablet (5 mg total) by mouth at bedtime. 90 tablet 3  . Ibuprofen 200 MG CAPS     . levothyroxine (SYNTHROID) 88 MCG tablet TAKE 1 TABLET(88 MCG) BY MOUTH DAILY 90 tablet 3  . Omega-3 Fatty Acids (FISH OIL PO) Take 1,400 mg by mouth 2 (two) times daily.     . celecoxib (CELEBREX) 200 MG capsule Take 1 capsule (200 mg total) by mouth daily. (Patient taking differently: Take 200 mg by mouth every other day.) 90 capsule 3  . methylPREDNISolone (MEDROL DOSEPAK) 4 MG TBPK tablet As directed (Patient not taking: Reported on 10/04/2020) 21 tablet 0   No facility-administered medications prior to visit.    ROS: Review of Systems  Constitutional: Positive for fatigue. Negative for activity change, appetite change, chills and unexpected weight change.  HENT: Negative for congestion, mouth sores and sinus pressure.   Eyes: Negative for visual disturbance.  Respiratory: Negative for cough and chest  tightness.   Gastrointestinal: Negative for abdominal pain and nausea.  Genitourinary: Negative for difficulty urinating, frequency and vaginal pain.  Musculoskeletal: Positive for arthralgias, back pain, gait problem, neck pain and neck stiffness.  Skin: Negative for pallor and rash.  Neurological: Negative for dizziness, tremors, weakness, numbness and headaches.  Psychiatric/Behavioral: Negative for confusion and sleep disturbance.    Objective:  BP (!) 142/70 (BP Location: Left Arm)   Pulse (!) 59   Temp 98.4 F (36.9 C) (Oral)   SpO2 96%   BP Readings from Last 3 Encounters:  10/04/20 (!) 142/70  09/06/20 138/62  07/18/20 (!) 146/60    Wt Readings from Last 3 Encounters:  07/18/20 126 lb 6.4 oz (57.3 kg)  06/07/20 122 lb (55.3 kg)  06/06/20 121 lb (54.9 kg)    Physical Exam Constitutional:      General: She is not in acute distress.    Appearance: She is well-developed.  HENT:     Head: Normocephalic.     Right Ear: External ear normal.     Left Ear: External ear normal.     Nose: Nose normal.     Mouth/Throat:     Mouth: Oropharynx is clear and moist.  Eyes:     General:        Right eye: No discharge.        Left eye: No discharge.  Conjunctiva/sclera: Conjunctivae normal.     Pupils: Pupils are equal, round, and reactive to light.  Neck:     Thyroid: No thyromegaly.     Vascular: No JVD.     Trachea: No tracheal deviation.  Cardiovascular:     Rate and Rhythm: Normal rate and regular rhythm.     Heart sounds: Normal heart sounds.  Pulmonary:     Effort: No respiratory distress.     Breath sounds: No stridor. No wheezing.  Abdominal:     General: Bowel sounds are normal. There is no distension.     Palpations: Abdomen is soft. There is no mass.     Tenderness: There is no abdominal tenderness. There is no guarding or rebound.  Musculoskeletal:        General: Tenderness present. No edema.     Cervical back: Normal range of motion and neck supple.   Lymphadenopathy:     Cervical: No cervical adenopathy.  Skin:    Findings: No erythema or rash.  Neurological:     Cranial Nerves: No cranial nerve deficit.     Motor: Weakness present. No abnormal muscle tone.     Coordination: Coordination abnormal.     Gait: Gait abnormal.     Deep Tendon Reflexes: Reflexes normal.  Psychiatric:        Mood and Affect: Mood and affect normal.        Behavior: Behavior normal.        Thought Content: Thought content normal.        Judgment: Judgment normal.    painful LS, neck, shoulders In a w/c  Lab Results  Component Value Date   WBC 12.6 (H) 12/08/2017   HGB 10.3 (L) 12/08/2017   HCT 32.0 (L) 12/08/2017   PLT 360 12/08/2017   GLUCOSE 98 06/06/2020   ALT 22 06/06/2020   AST 39 (H) 06/06/2020   NA 131 (L) 06/06/2020   K 5.1 06/06/2020   CL 94 (L) 06/06/2020   CREATININE 1.23 (H) 06/06/2020   BUN 24 (H) 06/06/2020   CO2 24 06/06/2020   TSH 0.95 06/06/2020   INR 1.06 11/26/2017    MM 3D SCREEN BREAST UNI RIGHT  Result Date: 08/09/2019 CLINICAL DATA:  Screening. EXAM: DIGITAL SCREENING UNILATERAL RIGHT MAMMOGRAM WITH CAD AND TOMO COMPARISON:  Previous exam(s). ACR Breast Density Category c: The breast tissue is heterogeneously dense, which may obscure small masses. FINDINGS: The patient has had a left mastectomy. There are no findings suspicious for malignancy. Images were processed with CAD. IMPRESSION: No mammographic evidence of malignancy. A result letter of this screening mammogram will be mailed directly to the patient. RECOMMENDATION: Screening mammogram in one year.  (Code:SM-R-29M) BI-RADS CATEGORY  1: Negative. Electronically Signed   By: Kristopher Oppenheim M.D.   On: 08/09/2019 12:50    Assessment & Plan:   There are no diagnoses linked to this encounter.   No orders of the defined types were placed in this encounter.    Follow-up: No follow-ups on file.  Walker Kehr, MD

## 2020-10-23 ENCOUNTER — Telehealth: Payer: Self-pay | Admitting: Internal Medicine

## 2020-10-23 MED ORDER — PREDNISONE 5 MG PO TABS: 15.0000 mg | ORAL_TABLET | Freq: Every day | ORAL | 5 refills | Status: DC

## 2020-10-23 NOTE — Telephone Encounter (Signed)
[facility] called and said that they are needing clarification on predniSONE (DELTASONE) 5 MG tablet

## 2020-10-23 NOTE — Telephone Encounter (Signed)
Predn 15 mg/d

## 2020-10-23 NOTE — Telephone Encounter (Signed)
MD sent new rx.Marland KitchenJohny Owens

## 2020-10-26 ENCOUNTER — Telehealth: Payer: Self-pay

## 2020-10-26 ENCOUNTER — Other Ambulatory Visit: Payer: Self-pay

## 2020-10-26 ENCOUNTER — Encounter: Payer: Self-pay | Admitting: Student

## 2020-10-26 ENCOUNTER — Ambulatory Visit
Admission: RE | Admit: 2020-10-26 | Discharge: 2020-10-26 | Disposition: A | Discharge status: Home / Self Care | Payer: Medicare HMO | Source: Ambulatory Visit | Attending: Student | Admitting: Student

## 2020-10-26 ENCOUNTER — Ambulatory Visit: Payer: Medicare HMO | Admitting: Student

## 2020-10-26 VITALS — BP 109/68 | HR 138 | Temp 97.7°F | Resp 17 | Ht 61.0 in | Wt 123.0 lb

## 2020-10-26 DIAGNOSIS — R072 Precordial pain: Secondary | ICD-10-CM | POA: Diagnosis not present

## 2020-10-26 DIAGNOSIS — R0602 Shortness of breath: Secondary | ICD-10-CM

## 2020-10-26 DIAGNOSIS — I10 Essential (primary) hypertension: Secondary | ICD-10-CM

## 2020-10-26 DIAGNOSIS — I350 Nonrheumatic aortic (valve) stenosis: Secondary | ICD-10-CM | POA: Diagnosis not present

## 2020-10-26 DIAGNOSIS — I2510 Atherosclerotic heart disease of native coronary artery without angina pectoris: Secondary | ICD-10-CM | POA: Diagnosis not present

## 2020-10-26 DIAGNOSIS — R079 Chest pain, unspecified: Secondary | ICD-10-CM | POA: Diagnosis not present

## 2020-10-26 DIAGNOSIS — R000 Tachycardia, unspecified: Secondary | ICD-10-CM | POA: Diagnosis not present

## 2020-10-26 DIAGNOSIS — I959 Hypotension, unspecified: Secondary | ICD-10-CM

## 2020-10-26 MED ORDER — ATENOLOL 50 MG PO TABS
25.0000 mg | ORAL_TABLET | Freq: Two times a day (BID) | ORAL | 3 refills | Status: DC
Start: 2020-10-26 — End: 2020-11-08

## 2020-10-26 NOTE — Progress Notes (Signed)
Please inform patient no evidence of pneumonia on chest x-ray. No active pulmonary or cardiac disease on chest x-ray.

## 2020-10-26 NOTE — Patient Instructions (Signed)
You can take tylenol max of 3 500 mg tablets for pain control.

## 2020-10-26 NOTE — Progress Notes (Signed)
Patient referred by Plotnikov, Evie Lacks, MD for abnormal EKG  Subjective:   Sandy Owens, female    DOB: 1924/05/31, 87 y.o.   MRN: [id number]   Chief Complaint  Patient presents with  . Follow-up  . Chest Pain     HPI  87 y.o. Caucasian female with hyperlipidemia, type 2 diabetes mellitus, peripheral neuropathy, CAD s/p CABG shortness of breath, abnormal EKG.   Patient presents for urgent visit with complaints of chest pain accompanied by her daughter, [name], and son-in-law.  Most of the history is provided by patient's daughter as patient herself has Owens-term memory loss.  Patient developed left-sided chest pain last night which lasted for several hours.  Chest pain had resolved this morning, however she has been experiencing shortness of breath since earlier this morning.  Patient denies active chest pain at this time.  However she continues to have dyspnea on exertion, and feels weak and fatigued overall.  Notably she uses a walker for ambulation.  Patient denies fever, chills, urinary urgency or frequency, abdominal pain.  She denies diarrhea, but does report she has been experiencing constipation over the last several days.   Patient is enrolled in remote patient monitoring which shows that over the last 1 day patient has become hypotensive and tachycardic.   Current Outpatient Medications on File Prior to Visit  Medication Sig Dispense Refill  . amLODipine (NORVASC) 5 MG tablet TAKE 1 TABLET(5 MG) BY MOUTH DAILY 90 tablet 3  . Artificial Tear Ointment (ARTIFICIAL TEARS) ointment Place 1 application into both eyes as needed (dry eyes).    Marland Kitchen aspirin 81 MG tablet Take 81 mg by mouth daily.    . Calcium Carbonate-Vit D-Min (GNP CALCIUM PLUS 600 +D PO) Take 1 tablet by mouth.    . Cholecalciferol (VITAMIN D3) 50 MCG (2000 UT) capsule Take 1 capsule (2,000 Units total) by mouth daily. 100 capsule 3  . CINNAMON PO Take 2,000 mg by mouth daily.    Marland Kitchen donepezil (ARICEPT)  5 MG tablet Take 1 tablet (5 mg total) by mouth at bedtime. 90 tablet 3  . levothyroxine (SYNTHROID) 88 MCG tablet TAKE 1 TABLET(88 MCG) BY MOUTH DAILY 90 tablet 3  . Multiple Vitamins-Minerals (PRESERVISION AREDS 2) CAPS Take 1 capsule by mouth daily.    . NON FORMULARY Take 1 tablet by mouth daily. [facility], Memory and nerve support    . Omega-3 Fatty Acids (FISH OIL PO) Take 1,400 mg by mouth 2 (two) times daily.     . predniSONE (DELTASONE) 5 MG tablet Take 3 tablets (15 mg total) by mouth daily with breakfast. 90 tablet 5   No current facility-administered medications on file prior to visit.    Cardiovascular and other pertinent studies: EKG 10/26/2020:  Atrial flutter/fibrillation versus sinus tachycardia at a rate of 137 bpm Right bundle branch block Old inferior infarct ST and T wave abnormalities, cannot exclude anterior ischemia   PCV ECHOCARDIOGRAM COMPLETE 06/07/2020 Left ventricle cavity is normal in size and wall thickness. Normal LV systolic function with visual EF 55-60%. Normal global wall motion. Doppler evidence of grade II (pseudonormal) diastolic dysfunction, elevated LAP. Left atrial cavity is mildly dilated. Trileaflet aortic valve with moderate calcification of the aortic valve annulus and leaflets.  Moderate aortic valve stenosis. Vmax 3.1 m/sec, mean PG 21 mmHg, AVA 1 cm2, AVAi0.65 cm2/m2. Dimensionless index 0.39. Mild tricuspid regurgitation. No evidence of pulmonary hypertension.   EKG 06/07/2020: Sinus rhythm 55 bpm Right bundle branch block  Echocardiogram 06/07/2020: Left ventricle cavity is normal in size and wall thickness. Normal LV systolic function with visual EF 55-60%. Normal global wall motion. Doppler evidence of grade II (pseudonormal) diastolic dysfunction, elevated LAP.  Left atrial cavity is mildly dilated. Trileaflet aortic valve with moderate calcification of the aortic valve annulus and leaflets.  Moderate aortic valve stenosis. Vmax 3.1  m/sec, mean PG 21 mmHg, AVA 1 cm2, AVAi0.65 cm2/m2. Dimensionless index 0.39.  Mild tricuspid regurgitation.  No evidence of pulmonary hypertension.  EKG 05/01/2020:  Sinus Bradycardia Right Bundle Branch Block Old inferior infarct  Op report 2001 (Dr. Roxy Manns): CABGX4 (LIMA-dLAD, SVG-OM2, seqSVG-PDA/RPLA)  Coronary angiogram 2001: 1.  Severe two-vessel coronary artery disease with critical lesions in the     proximal right coronary artery and mid left anterior descending artery. 2.  Moderate stenosis mid circumflex. 3.  Normal left internal mammary artery. 4.  Normal left ventricular function at rest. 5.  Calcific irregular plaque in the abdominal aorta. 6.  Normal renal arteries. 7.  Successful Perclose of the right femoral artery.   Recent labs: 06/06/2020: Sodium 131, potassium 5.1, glucose 90, BUN 24, creatinine 1.23, GFR 37 TSH 0.95 Vitamin D 29.4 (low)  05/01/2020: Glucose 129, BUN/Cr 22/1.2. EGFR 53. Na/K 136/5.1. ALB: 3.2,  HbA1C 6.6% Chol 219, TG 256, HDL 42, LDL 126   Review of Systems  Constitutional: Positive for malaise/fatigue. Negative for weight gain.  Cardiovascular: Positive for chest pain and dyspnea on exertion. Negative for claudication, leg swelling, near-syncope, orthopnea, palpitations, paroxysmal nocturnal dyspnea and syncope.  Hematologic/Lymphatic: Does not bruise/bleed easily.  Gastrointestinal: Negative for melena.  Neurological: Positive for weakness. Negative for dizziness.  Psychiatric/Behavioral: Positive for memory loss.         Vitals:   10/26/20 1122  BP: 109/68  Pulse: (!) 138  Resp: 17  Temp: 97.7 F (36.5 C)  SpO2: 95%     Body mass index is 23.24 kg/m. Filed Weights   10/26/20 1122  Weight: 123 lb (55.8 kg)     Objective:   Physical Exam Vitals and nursing note reviewed.  Constitutional:      General: She is not in acute distress.    Comments: frail  Neck:     Vascular: No JVD.  Cardiovascular:     Rate  and Rhythm: Regular rhythm. Tachycardia present.     Heart sounds: Murmur heard.   Harsh midsystolic murmur is present with a grade of 3/6 at the upper right sternal border radiating to the neck.   Pulmonary:     Effort: Pulmonary effort is normal.     Breath sounds: Normal breath sounds. No wheezing or rales.  Musculoskeletal:     Right lower leg: No edema.     Left lower leg: No edema.  Skin:    General: Skin is warm and dry.  Neurological:     General: No focal deficit present.        Assessment & Recommendations:   87 y.o. Caucasian female with hyperlipidemia, type 2 diabetes mellitus, peripheral neuropathy, CAD s/p CABG shortness of breath, abnormal EKG.   Discussed at length with patient and her family goals of care in view of advanced age and memory loss.  Shared decision was to avoid further procedural intervention, for example cardiac catheterization.  Patient and family wish to proceed with comfort for patient as the primary goal of care.  Patient and her family verbalized understanding that patient's present condition may be life-threatening and agree to focus on comfort  care primarily.   Precordial Pain/CAD:  EKG today compared to previous shows no new ischemic changes. We will continue aspirin 81 mg daily.   Also continue atenolol twice daily, however reduced from 50 mg to 25 mg twice daily. Patient is not on statin therapy due to history of myalgias with statins. Advised patient to take Tylenol for pain. We will obtain BMP, BNP, troponin, CBC.  Patient and her family are aware that if patient does have ongoing ischemia/injury to cardiac muscle this may be life-threatening.   Counseled them regarding management options including transporting patient to emergency department for further evaluation and management.  Patient and her family prefer to avoid further invasive management or diagnostic testing, and rather wish to proceed with managing patient's symptoms at  home. She does not want to pursue ischemia management, as she is not interested in invasive management, if it is required.   Tachycardia: EKG today revealed tachycardia, atrial flutter versus sinus.  Unable to discern rhythm without decreasing heart rate. Discussed with patient management option of going to the emergency department for rate control medications to further determine underlying rhythm.   Patient declines evaluation in the emergency department as goal of care is presently comfort.  Hypotension: We will reduce atenolol from 50 mg to 25 mg twice daily. Encourage liberal hydration Hold amlodipine.  Exertional dyspnea, murmur: Moderate aortic stenosis, grade II diastolic dysfunction.  Patient is not a candidate for aortic valve replacement, and she wishes to avoid invasive management. As patient symptoms of shortness of breath have worsened over the last 24 hours, will obtain chest x-ray to evaluate for underlying pneumonia.  Fatigue: We will obtain TSH, as well as urinalysis to evaluate for underlying UTI as etiology of symptoms.  Follow-up in 2 weeks with Dr. Virgina Jock.   Patient was seen in collaboration with Dr. Virgina Jock. He also reviewed patient's chart and examined the patient. Dr. Virgina Jock is in agreement of the plan.    This was a 50-minute encounter with face-to-face counseling, medical records review, coordination of care, explanation of complex medical issues, complex medical decision making, goals of care discussion.      Alethia Berthold, PA-C 10/28/2020, 6:28 PM Office: [phone]

## 2020-10-26 NOTE — Progress Notes (Signed)
Called and spoke with pts daughter regarding chest x-ray results. Pt voiced understanding.

## 2020-10-26 NOTE — Telephone Encounter (Signed)
Patient's daughter called that patient was having chest pain over the night and this morning her bp was 81/60 and the second reading was 99/62 please advise

## 2020-10-27 ENCOUNTER — Telehealth: Payer: Self-pay

## 2020-10-27 LAB — BASIC METABOLIC PANEL
BUN/Creatinine Ratio: 24 (ref 12–28)
BUN: 24 mg/dL (ref 10–36)
CO2: 23 mmol/L (ref 20–29)
Calcium: 9.6 mg/dL (ref 8.7–10.3)
Chloride: 94 mmol/L — ABNORMAL LOW (ref 96–106)
Creatinine, Ser: 1.01 mg/dL — ABNORMAL HIGH (ref 0.57–1.00)
GFR calc Af Amer: 55 mL/min/{1.73_m2} — ABNORMAL LOW (ref >59)
GFR calc non Af Amer: 47 mL/min/{1.73_m2} — ABNORMAL LOW (ref >59)
Glucose: 213 mg/dL — ABNORMAL HIGH (ref 65–99)
Potassium: 5 mmol/L (ref 3.5–5.2)
Sodium: 134 mmol/L (ref 134–144)

## 2020-10-27 LAB — URINALYSIS
Bilirubin, UA: NEGATIVE
Glucose, UA: NEGATIVE
Nitrite, UA: POSITIVE — AB
RBC, UA: NEGATIVE
Specific Gravity, UA: 1.027 (ref 1.005–1.030)
Urobilinogen, Ur: 0.2 mg/dL (ref 0.2–1.0)
pH, UA: 5 (ref 5.0–7.5)

## 2020-10-27 LAB — CBC
Hematocrit: 36.3 % (ref 34.0–46.6)
Hemoglobin: 11.3 g/dL (ref 11.1–15.9)
MCH: 25.4 pg — ABNORMAL LOW (ref 26.6–33.0)
MCHC: 31.1 g/dL — ABNORMAL LOW (ref 31.5–35.7)
MCV: 82 fL (ref 79–97)
Platelets: 301 10*3/uL (ref 150–450)
RBC: 4.45 x10E6/uL (ref 3.77–5.28)
RDW: 15.9 % — ABNORMAL HIGH (ref 11.7–15.4)
WBC: 16.2 10*3/uL — ABNORMAL HIGH (ref 3.4–10.8)

## 2020-10-27 LAB — BRAIN NATRIURETIC PEPTIDE: BNP: 340.1 pg/mL — ABNORMAL HIGH (ref 0.0–100.0)

## 2020-10-27 LAB — TSH: TSH: 2.36 u[IU]/mL (ref 0.450–4.500)

## 2020-10-27 LAB — TROPONIN T: Troponin T (Highly Sensitive): 260 ng/L (ref 0–14)

## 2020-10-27 MED ORDER — SULFAMETHOXAZOLE-TRIMETHOPRIM 800-160 MG PO TABS: 1.0000 | ORAL_TABLET | Freq: Two times a day (BID) | ORAL | 0 refills | Status: DC

## 2020-10-27 NOTE — Progress Notes (Signed)
Please see recent urinalysis.  I have started patient on Bactrim twice daily for 3 days.  Advised patient to follow-up with PCP regarding further management.

## 2020-10-27 NOTE — Telephone Encounter (Signed)
Pt's daughter called to get lab results done yesterday. Please review and advise//

## 2020-10-27 NOTE — Telephone Encounter (Signed)
Called and spoke with patient's daughter [name] regarding recent lab results.  Patient's urinalysis showed evidence of UTI, therefore I have sent for Bactrim twice daily for 3 days.  Will forward results to PCP, recommend patient follow-up for UTI with primary care provider.  Also discussed with patient's daughter regarding elevated troponin and BNP.  Advised patient to continue aspirin and atenolol.  She has history of statin allergy, therefore holding off initiating statin.  Blood pressure also remains soft, will hold off on initiating ARB/ACE inhibitor.  We will evaluate medical management at follow-up visit.  Patient's daughter verbalized understanding and agreement.

## 2020-10-27 NOTE — Telephone Encounter (Signed)
Please review labs. 

## 2020-10-27 NOTE — Progress Notes (Signed)
I have personally discussed lab results and management with patient's daughter [name].  Please see telephone encounter.

## 2020-10-29 NOTE — Progress Notes (Signed)
I reviewed her chart, please see if the Rx is appropriate as we are not adept in Rx for pneumonia. Trop positive for probably demand ischemia. Even if [place], given her age, CXR findings, would not do anything different.  Thanks Cristie Hem

## 2020-11-02 DIAGNOSIS — I10 Essential (primary) hypertension: Secondary | ICD-10-CM | POA: Diagnosis not present

## 2020-11-08 ENCOUNTER — Telehealth: Payer: Medicare HMO | Admitting: Cardiology

## 2020-11-08 ENCOUNTER — Ambulatory Visit: Payer: Medicare HMO | Admitting: Podiatry

## 2020-11-08 ENCOUNTER — Inpatient Hospital Stay: Payer: Medicare HMO

## 2020-11-08 ENCOUNTER — Other Ambulatory Visit: Payer: Self-pay

## 2020-11-08 ENCOUNTER — Encounter: Payer: Self-pay | Admitting: Cardiology

## 2020-11-08 VITALS — BP 131/62 | HR 60

## 2020-11-08 DIAGNOSIS — R000 Tachycardia, unspecified: Secondary | ICD-10-CM

## 2020-11-08 DIAGNOSIS — R55 Syncope and collapse: Secondary | ICD-10-CM

## 2020-11-08 DIAGNOSIS — I471 Supraventricular tachycardia: Secondary | ICD-10-CM

## 2020-11-08 MED ORDER — ATENOLOL 50 MG PO TABS: 25.0000 mg | ORAL_TABLET | Freq: Three times a day (TID) | ORAL | 3 refills | Status: DC

## 2020-11-08 NOTE — Progress Notes (Signed)
Subjective:   Sandy Owens, female    DOB: 13-Oct-1923, 87 y.o.   MRN: [id number]   I connected with the patient on 11/08/20 by a video enabled telemedicine application and verified that I am speaking with the correct person using two identifiers.     I discussed the limitations of evaluation and management by telemedicine and the availability of in person appointments. The patient expressed understanding and agreed to proceed.   This visit type was conducted due to national recommendations for restrictions regarding the COVID-19 Pandemic (e.g. social distancing).  This format is felt to be most appropriate for this patient at this time.  All issues noted in this document were discussed and addressed.  No physical exam was performed (except for noted visual exam findings with Tele health visits).  The patient has consented to conduct a Tele health visit and understands insurance will be billed.     Chief complaint:  Tachycardia   HPI  87 y.o. Caucasian female with hyperlipidemia, type 2 diabetes mellitus, peripheral neuropathy, CAD s/p CABG, palpitations  Patient was seen in our office few days ago with episdoes of chest pain, tachcyardia and palpitations. Based on EKG, it was difficult to discern the origin of her tachyarrhty,ia. Her chest pain episdoe was concerning for unstable angina. Patient and family were clear that they do not want hospital/ER admission or any invasive management and requested conservative approach. Her woekup showed UTI, and mildly elevated HS trop at 260.  Patient is now having more episdoes of palpitations and chest pain. Last night, she had 4th episode of tachycardia and hypotension. She took tylenol. Episode ;asted through the night. Around that time, her BP was 106/72, HR was 130    Current Outpatient Medications on File Prior to Visit  Medication Sig Dispense Refill  . amLODipine (NORVASC) 5 MG tablet TAKE 1 TABLET(5 MG) BY MOUTH DAILY 90  tablet 3  . Artificial Tear Ointment (ARTIFICIAL TEARS) ointment Place 1 application into both eyes as needed (dry eyes).    Marland Kitchen aspirin 81 MG tablet Take 81 mg by mouth daily.    Marland Kitchen atenolol (TENORMIN) 50 MG tablet Take 0.5 tablets (25 mg total) by mouth 2 (two) times daily. TAKE 1 TABLET(50 MG) BY MOUTH TWICE DAILY 180 tablet 3  . Calcium Carbonate-Vit D-Min (GNP CALCIUM PLUS 600 +D PO) Take 1 tablet by mouth.    . Cholecalciferol (VITAMIN D3) 50 MCG (2000 UT) capsule Take 1 capsule (2,000 Units total) by mouth daily. 100 capsule 3  . CINNAMON PO Take 2,000 mg by mouth daily.    Marland Kitchen donepezil (ARICEPT) 5 MG tablet Take 1 tablet (5 mg total) by mouth at bedtime. 90 tablet 3  . levothyroxine (SYNTHROID) 88 MCG tablet TAKE 1 TABLET(88 MCG) BY MOUTH DAILY 90 tablet 3  . Multiple Vitamins-Minerals (PRESERVISION AREDS 2 PO) Take by mouth.    . NON FORMULARY Take 1 tablet by mouth daily. [facility], Memory and nerve support    . Omega-3 Fatty Acids (FISH OIL PO) Take 1,400 mg by mouth 2 (two) times daily.     . predniSONE (DELTASONE) 5 MG tablet Take 3 tablets (15 mg total) by mouth daily with breakfast. 90 tablet 5   No current facility-administered medications on file prior to visit.    Cardiovascular and other pertinent studies:   EKG 10/26/2020:  Atrial flutter/fibrillation versus sinus tachycardia at a rate of 137 bpm Right bundle branch block Old inferior infarct ST and T wave  abnormalities, cannot exclude anterior ischemia ABNORMAL    Echocardiogram 06/07/2020:  Left ventricle cavity is normal in size and wall thickness. Normal LV  systolic function with visual EF 55-60%. Normal global wall motion.  Doppler evidence of grade II (pseudonormal) diastolic dysfunction,  elevated LAP.  Left atrial cavity is mildly dilated.  Trileaflet aortic valve with moderate calcification of the aortic valve  annulus and leaflets. Moderate aortic valve stenosis. Vmax 3.1 m/sec,  mean PG 21 mmHg, AVA 1  cm2, AVAi0.65 cm2/m2. Dimensionless index 0.39.  Mild tricuspid regurgitation.  No evidence of pulmonary hypertension.  Recent labs: 10/26/2020: Glucose 213, BUN/Cr 24/1.01. EGFR 47. Na/K 134/5.0. H/H 11/36. MCV 82. Platelets 301 TSH 2.3 normal BNP 340 Trop HS 260   Review of Systems  Cardiovascular: Positive for chest pain and palpitations. Negative for dyspnea on exertion, leg swelling and syncope.         Vitals:   11/08/20 1125  BP: 131/62  Pulse: 60   (Measured by the patient using a home BP monitor)   Observation/findings during video visit   Objective:    Physical Exam Vitals and nursing note reviewed.  Constitutional:      General: She is not in acute distress.    Appearance: She is well-developed and well-nourished.  Pulmonary:     Effort: Pulmonary effort is normal.  Neurological:     Mental Status: She is alert and oriented to person, place, and time.  Psychiatric:        Mood and Affect: Mood and affect normal.        Assessment & Recommendations:   87 y.o. Caucasian female with hyperlipidemia, type 2 diabetes mellitus, peripheral neuropathy, CAD s/p CABG, palpitations  Tachycardia, chest pain: Likely tachyarrhythmia causing hypotension, as well as supply demand mismatch and troponin elevation, although type 1 MI cannot be excluded. Patient and family want to pursue conservative management, but agreed to use cardiac telemetry for further assessment, Increase atenolol to 25 mg tid. Continue   F/u in 3-4 weeks     Nigel Mormon, MD Pager: [phone] Office: [phone]

## 2020-11-17 ENCOUNTER — Ambulatory Visit: Payer: Medicare HMO | Admitting: Cardiology

## 2020-11-21 ENCOUNTER — Telehealth: Payer: Self-pay

## 2020-11-21 DIAGNOSIS — R000 Tachycardia, unspecified: Secondary | ICD-10-CM | POA: Diagnosis not present

## 2020-11-21 DIAGNOSIS — R55 Syncope and collapse: Secondary | ICD-10-CM | POA: Diagnosis not present

## 2020-11-21 NOTE — Telephone Encounter (Signed)
Pt called requesting her monitor results

## 2020-11-22 ENCOUNTER — Ambulatory Visit: Payer: Medicare HMO | Admitting: Internal Medicine

## 2020-11-22 DIAGNOSIS — R55 Syncope and collapse: Secondary | ICD-10-CM | POA: Diagnosis not present

## 2020-11-22 DIAGNOSIS — R000 Tachycardia, unspecified: Secondary | ICD-10-CM | POA: Diagnosis not present

## 2020-11-22 MED ORDER — IVABRADINE HCL 5 MG PO TABS: 5.0000 mg | ORAL_TABLET | Freq: Two times a day (BID) | ORAL | 1 refills | Status: DC

## 2020-11-22 NOTE — Addendum Note (Signed)
Addended by: Nigel Mormon on: 11/22/2020 12:31 PM   Modules accepted: Orders

## 2020-11-22 NOTE — Telephone Encounter (Signed)
From patient.

## 2020-11-22 NOTE — Telephone Encounter (Signed)
Pt called again requesting results

## 2020-11-24 ENCOUNTER — Other Ambulatory Visit: Payer: Self-pay

## 2020-11-24 DIAGNOSIS — I471 Supraventricular tachycardia: Secondary | ICD-10-CM

## 2020-11-24 MED ORDER — IVABRADINE HCL 5 MG PO TABS: 5.0000 mg | ORAL_TABLET | Freq: Two times a day (BID) | ORAL | 3 refills | Status: DC

## 2020-11-24 NOTE — Telephone Encounter (Signed)
Pt's home remote BP monitor showed HR of 153 this morning. Called to discuss with pt and daughter. Per daughter, pt reports that she felt really fatigued and SOB this morning during the episode. Attests to feeling palpitations. Symptoms lasted to few seconds. Pt tolerating new start corlanor 5 mg BID. Stopped atenolol as discussed. HR improved to 60s follow the episode. Pt currently feeling worn out and sleeping. Recent Zio telemetry monitor results showed evidence of repeated SVTs. HR continues to remain in 60s since the initial episodes.   Discussed it with Dr. Virgina Jock. Discussed option for hospital admission for symptom management. Per pt's daughter, initially pt was hesitant to get extensive work-up in the hospital, but now are open to hospital admission if pt would be able to tolerate any potential procedure and get symptom relief. Daughter would like to discuss it further with her siblings and with the pt.   Would like to discuss it with Dr. Virgina Jock further about their options and further delineate any potential risk-benefit of potential hospital work-up. Pt would also like to also evaluate referral to hospice/palliative care to help provide more comfort care at home.

## 2020-11-24 NOTE — Telephone Encounter (Signed)
Spoke with patient's daughter nonoperational at home.  Patient had another episode tachycardia lasted for about 2 hours.  Symptoms are now resolved.  I suspect she is having recurrent episodes of paroxysmal SVT.  I discussed vagal maneuvers with the patient.  Continue current 5 mg twice daily, that was recently started.  If she has recurrent episodes and symptoms do not get better with vagal maneuvers, she is willing to go to emergency room as needed.  [name] expressed patient's wishes of seeking palliative care or hospice care.  I do not think patient will be a candidate for hospice care given lack of any terminal illness diagnosis.  However, I would refer her to palliative care.  Nigel Mormon, MD

## 2020-11-25 ENCOUNTER — Emergency Department (HOSPITAL_COMMUNITY): Payer: Medicare HMO

## 2020-11-25 ENCOUNTER — Other Ambulatory Visit: Payer: Self-pay

## 2020-11-25 ENCOUNTER — Inpatient Hospital Stay (HOSPITAL_COMMUNITY)
Admission: EM | Admit: 2020-11-25 | Discharge: 2020-11-28 | DRG: 309 | Disposition: A | Discharge status: Home Health | Payer: Medicare HMO | Attending: Cardiology | Admitting: Cardiology

## 2020-11-25 ENCOUNTER — Encounter (HOSPITAL_COMMUNITY): Payer: Self-pay | Admitting: Pharmacy Technician

## 2020-11-25 ENCOUNTER — Observation Stay (HOSPITAL_COMMUNITY): Payer: Medicare HMO

## 2020-11-25 DIAGNOSIS — R072 Precordial pain: Secondary | ICD-10-CM | POA: Diagnosis not present

## 2020-11-25 DIAGNOSIS — Z853 Personal history of malignant neoplasm of breast: Secondary | ICD-10-CM

## 2020-11-25 DIAGNOSIS — I10 Essential (primary) hypertension: Secondary | ICD-10-CM | POA: Diagnosis present

## 2020-11-25 DIAGNOSIS — Z20822 Contact with and (suspected) exposure to covid-19: Secondary | ICD-10-CM | POA: Diagnosis present

## 2020-11-25 DIAGNOSIS — R079 Chest pain, unspecified: Secondary | ICD-10-CM | POA: Diagnosis not present

## 2020-11-25 DIAGNOSIS — I2510 Atherosclerotic heart disease of native coronary artery without angina pectoris: Secondary | ICD-10-CM | POA: Diagnosis present

## 2020-11-25 DIAGNOSIS — Z9012 Acquired absence of left breast and nipple: Secondary | ICD-10-CM | POA: Diagnosis not present

## 2020-11-25 DIAGNOSIS — I248 Other forms of acute ischemic heart disease: Secondary | ICD-10-CM | POA: Diagnosis not present

## 2020-11-25 DIAGNOSIS — I480 Paroxysmal atrial fibrillation: Secondary | ICD-10-CM | POA: Diagnosis not present

## 2020-11-25 DIAGNOSIS — Z7952 Long term (current) use of systemic steroids: Secondary | ICD-10-CM

## 2020-11-25 DIAGNOSIS — I214 Non-ST elevation (NSTEMI) myocardial infarction: Secondary | ICD-10-CM

## 2020-11-25 DIAGNOSIS — R739 Hyperglycemia, unspecified: Secondary | ICD-10-CM | POA: Diagnosis not present

## 2020-11-25 DIAGNOSIS — R059 Cough, unspecified: Secondary | ICD-10-CM | POA: Diagnosis not present

## 2020-11-25 DIAGNOSIS — R000 Tachycardia, unspecified: Secondary | ICD-10-CM | POA: Diagnosis not present

## 2020-11-25 DIAGNOSIS — N390 Urinary tract infection, site not specified: Secondary | ICD-10-CM | POA: Diagnosis not present

## 2020-11-25 DIAGNOSIS — Z806 Family history of leukemia: Secondary | ICD-10-CM | POA: Diagnosis not present

## 2020-11-25 DIAGNOSIS — I471 Supraventricular tachycardia: Secondary | ICD-10-CM | POA: Diagnosis not present

## 2020-11-25 DIAGNOSIS — E1142 Type 2 diabetes mellitus with diabetic polyneuropathy: Secondary | ICD-10-CM | POA: Diagnosis present

## 2020-11-25 DIAGNOSIS — I350 Nonrheumatic aortic (valve) stenosis: Secondary | ICD-10-CM | POA: Diagnosis not present

## 2020-11-25 DIAGNOSIS — R002 Palpitations: Secondary | ICD-10-CM | POA: Diagnosis present

## 2020-11-25 DIAGNOSIS — I445 Left posterior fascicular block: Secondary | ICD-10-CM | POA: Diagnosis present

## 2020-11-25 DIAGNOSIS — Z888 Allergy status to other drugs, medicaments and biological substances status: Secondary | ICD-10-CM

## 2020-11-25 DIAGNOSIS — Z951 Presence of aortocoronary bypass graft: Secondary | ICD-10-CM

## 2020-11-25 DIAGNOSIS — E039 Hypothyroidism, unspecified: Secondary | ICD-10-CM | POA: Diagnosis present

## 2020-11-25 DIAGNOSIS — F0390 Unspecified dementia without behavioral disturbance: Secondary | ICD-10-CM | POA: Diagnosis not present

## 2020-11-25 DIAGNOSIS — Z9109 Other allergy status, other than to drugs and biological substances: Secondary | ICD-10-CM | POA: Diagnosis not present

## 2020-11-25 DIAGNOSIS — R300 Dysuria: Secondary | ICD-10-CM | POA: Diagnosis present

## 2020-11-25 DIAGNOSIS — R0609 Other forms of dyspnea: Secondary | ICD-10-CM

## 2020-11-25 DIAGNOSIS — Z87891 Personal history of nicotine dependence: Secondary | ICD-10-CM | POA: Diagnosis not present

## 2020-11-25 DIAGNOSIS — I4719 Other supraventricular tachycardia: Secondary | ICD-10-CM | POA: Diagnosis present

## 2020-11-25 DIAGNOSIS — R413 Other amnesia: Secondary | ICD-10-CM

## 2020-11-25 DIAGNOSIS — I213 ST elevation (STEMI) myocardial infarction of unspecified site: Secondary | ICD-10-CM | POA: Diagnosis not present

## 2020-11-25 DIAGNOSIS — I4510 Unspecified right bundle-branch block: Secondary | ICD-10-CM | POA: Diagnosis not present

## 2020-11-25 DIAGNOSIS — M545 Low back pain, unspecified: Secondary | ICD-10-CM | POA: Diagnosis present

## 2020-11-25 DIAGNOSIS — E785 Hyperlipidemia, unspecified: Secondary | ICD-10-CM | POA: Diagnosis present

## 2020-11-25 DIAGNOSIS — I517 Cardiomegaly: Secondary | ICD-10-CM | POA: Diagnosis not present

## 2020-11-25 DIAGNOSIS — T380X5A Adverse effect of glucocorticoids and synthetic analogues, initial encounter: Secondary | ICD-10-CM | POA: Diagnosis present

## 2020-11-25 DIAGNOSIS — R0600 Dyspnea, unspecified: Secondary | ICD-10-CM

## 2020-11-25 DIAGNOSIS — E1165 Type 2 diabetes mellitus with hyperglycemia: Secondary | ICD-10-CM | POA: Diagnosis not present

## 2020-11-25 DIAGNOSIS — R0789 Other chest pain: Secondary | ICD-10-CM | POA: Diagnosis not present

## 2020-11-25 HISTORY — DX: Unspecified dementia, unspecified severity, without behavioral disturbance, psychotic disturbance, mood disturbance, and anxiety: F03.90

## 2020-11-25 HISTORY — DX: Unspecified osteoarthritis, unspecified site: M19.90

## 2020-11-25 HISTORY — DX: Acute myocardial infarction, unspecified: I21.9

## 2020-11-25 HISTORY — DX: Gout, unspecified: M10.9

## 2020-11-25 LAB — CBC WITH DIFFERENTIAL/PLATELET
Abs Immature Granulocytes: 0.18 10*3/uL — ABNORMAL HIGH (ref 0.00–0.07)
Basophils Absolute: 0 10*3/uL (ref 0.0–0.1)
Basophils Relative: 0 %
Eosinophils Absolute: 0 10*3/uL (ref 0.0–0.5)
Eosinophils Relative: 0 %
HCT: 32.8 % — ABNORMAL LOW (ref 36.0–46.0)
Hemoglobin: 10.5 g/dL — ABNORMAL LOW (ref 12.0–15.0)
Immature Granulocytes: 2 %
Lymphocytes Relative: 15 %
Lymphs Abs: 1.4 10*3/uL (ref 0.7–4.0)
MCH: 27.1 pg (ref 26.0–34.0)
MCHC: 32 g/dL (ref 30.0–36.0)
MCV: 84.8 fL (ref 80.0–100.0)
Monocytes Absolute: 0.5 10*3/uL (ref 0.1–1.0)
Monocytes Relative: 6 %
Neutro Abs: 7.1 10*3/uL (ref 1.7–7.7)
Neutrophils Relative %: 77 %
Platelets: 199 10*3/uL (ref 150–400)
RBC: 3.87 MIL/uL (ref 3.87–5.11)
RDW: 18.5 % — ABNORMAL HIGH (ref 11.5–15.5)
WBC: 9.3 10*3/uL (ref 4.0–10.5)
nRBC: 0 % (ref 0.0–0.2)

## 2020-11-25 LAB — COMPREHENSIVE METABOLIC PANEL
ALT: 51 U/L — ABNORMAL HIGH (ref 0–44)
AST: 59 U/L — ABNORMAL HIGH (ref 15–41)
Albumin: 2.4 g/dL — ABNORMAL LOW (ref 3.5–5.0)
Alkaline Phosphatase: 126 U/L (ref 38–126)
Anion gap: 12 (ref 5–15)
BUN: 25 mg/dL — ABNORMAL HIGH (ref 8–23)
CO2: 25 mmol/L (ref 22–32)
Calcium: 8.7 mg/dL — ABNORMAL LOW (ref 8.9–10.3)
Chloride: 91 mmol/L — ABNORMAL LOW (ref 98–111)
Creatinine, Ser: 1.22 mg/dL — ABNORMAL HIGH (ref 0.44–1.00)
GFR, Estimated: 41 mL/min — ABNORMAL LOW (ref >60)
Glucose, Bld: 498 mg/dL — ABNORMAL HIGH (ref 70–99)
Potassium: 5.2 mmol/L — ABNORMAL HIGH (ref 3.5–5.1)
Sodium: 128 mmol/L — ABNORMAL LOW (ref 135–145)
Total Bilirubin: 0.8 mg/dL (ref 0.3–1.2)
Total Protein: 5.9 g/dL — ABNORMAL LOW (ref 6.5–8.1)

## 2020-11-25 LAB — ECHOCARDIOGRAM COMPLETE
AR max vel: 1.02 cm2
AV Area VTI: 1.31 cm2
AV Area mean vel: 1.14 cm2
AV Mean grad: 23 mmHg
AV Peak grad: 39.4 mmHg
Ao pk vel: 3.14 m/s
Area-P 1/2: 3.42 cm2
Height: 61 in
MV VTI: 2.27 cm2
S' Lateral: 2.9 cm
Weight: 1940.05 oz

## 2020-11-25 LAB — RESP PANEL BY RT-PCR (FLU A&B, COVID) ARPGX2
Influenza A by PCR: NEGATIVE
Influenza B by PCR: NEGATIVE
SARS Coronavirus 2 by RT PCR: NEGATIVE

## 2020-11-25 LAB — PROTIME-INR
INR: 1 (ref 0.8–1.2)
Prothrombin Time: 13.2 seconds (ref 11.4–15.2)

## 2020-11-25 LAB — BASIC METABOLIC PANEL
Anion gap: 13 (ref 5–15)
Anion gap: 10 (ref 5–15)
BUN: 23 mg/dL (ref 8–23)
BUN: 22 mg/dL (ref 8–23)
CO2: 20 mmol/L — ABNORMAL LOW (ref 22–32)
CO2: 24 mmol/L (ref 22–32)
Calcium: 8.4 mg/dL — ABNORMAL LOW (ref 8.9–10.3)
Calcium: 8.5 mg/dL — ABNORMAL LOW (ref 8.9–10.3)
Chloride: 94 mmol/L — ABNORMAL LOW (ref 98–111)
Chloride: 95 mmol/L — ABNORMAL LOW (ref 98–111)
Creatinine, Ser: 1.18 mg/dL — ABNORMAL HIGH (ref 0.44–1.00)
Creatinine, Ser: 1.05 mg/dL — ABNORMAL HIGH (ref 0.44–1.00)
GFR, Estimated: 43 mL/min — ABNORMAL LOW (ref >60)
GFR, Estimated: 49 mL/min — ABNORMAL LOW (ref >60)
Glucose, Bld: 513 mg/dL (ref 70–99)
Glucose, Bld: 391 mg/dL — ABNORMAL HIGH (ref 70–99)
Potassium: 4.8 mmol/L (ref 3.5–5.1)
Potassium: 4.2 mmol/L (ref 3.5–5.1)
Sodium: 127 mmol/L — ABNORMAL LOW (ref 135–145)
Sodium: 129 mmol/L — ABNORMAL LOW (ref 135–145)

## 2020-11-25 LAB — GLUCOSE, CAPILLARY
Glucose-Capillary: 475 mg/dL — ABNORMAL HIGH (ref 70–99)
Glucose-Capillary: 536 mg/dL (ref 70–99)
Glucose-Capillary: 502 mg/dL (ref 70–99)
Glucose-Capillary: 507 mg/dL (ref 70–99)
Glucose-Capillary: 488 mg/dL — ABNORMAL HIGH (ref 70–99)
Glucose-Capillary: 393 mg/dL — ABNORMAL HIGH (ref 70–99)
Glucose-Capillary: 317 mg/dL — ABNORMAL HIGH (ref 70–99)

## 2020-11-25 LAB — TROPONIN I (HIGH SENSITIVITY)
Troponin I (High Sensitivity): 251 ng/L (ref <18)
Troponin I (High Sensitivity): 1236 ng/L (ref <18)

## 2020-11-25 LAB — HEPARIN LEVEL (UNFRACTIONATED): Heparin Unfractionated: 0.41 IU/mL (ref 0.30–0.70)

## 2020-11-25 LAB — CBG MONITORING, ED: Glucose-Capillary: 500 mg/dL — ABNORMAL HIGH (ref 70–99)

## 2020-11-25 LAB — HEMOGLOBIN A1C
Hgb A1c MFr Bld: 9.8 % — ABNORMAL HIGH (ref 4.8–5.6)
Mean Plasma Glucose: 234.56 mg/dL

## 2020-11-25 LAB — TSH: TSH: 6.807 u[IU]/mL — ABNORMAL HIGH (ref 0.350–4.500)

## 2020-11-25 MED ORDER — METOPROLOL TARTRATE 5 MG/5ML IV SOLN
2.5000 mg | Freq: Once | INTRAVENOUS | Status: AC
  Administered 2020-11-25: 2.5 mg via INTRAVENOUS
  Filled 2020-11-25: qty 5

## 2020-11-25 MED ORDER — AMLODIPINE BESYLATE 5 MG PO TABS
5.0000 mg | ORAL_TABLET | Freq: Every day | ORAL | Status: DC
  Administered 2020-11-25 – 2020-11-28 (×4): 5 mg via ORAL
  Filled 2020-11-25 (×4): qty 1

## 2020-11-25 MED ORDER — SODIUM CHLORIDE 0.9 % IV SOLN: INTRAVENOUS | Status: DC

## 2020-11-25 MED ORDER — LEVOTHYROXINE SODIUM 88 MCG PO TABS
88.0000 ug | ORAL_TABLET | Freq: Every day | ORAL | Status: DC
  Administered 2020-11-26 – 2020-11-28 (×3): 88 ug via ORAL
  Filled 2020-11-25 (×3): qty 1

## 2020-11-25 MED ORDER — CLOPIDOGREL BISULFATE 75 MG PO TABS
75.0000 mg | ORAL_TABLET | Freq: Every day | ORAL | Status: DC
  Administered 2020-11-25 – 2020-11-26 (×2): 75 mg via ORAL
  Filled 2020-11-25 (×3): qty 1

## 2020-11-25 MED ORDER — INSULIN ASPART 100 UNIT/ML ~~LOC~~ SOLN
0.0000 [IU] | Freq: Three times a day (TID) | SUBCUTANEOUS | Status: DC
  Administered 2020-11-25: 10 [IU] via SUBCUTANEOUS

## 2020-11-25 MED ORDER — HEPARIN SODIUM (PORCINE) 5000 UNIT/ML IJ SOLN: 5000.0000 [IU] | Freq: Three times a day (TID) | INTRAMUSCULAR | Status: DC

## 2020-11-25 MED ORDER — AMIODARONE HCL 200 MG PO TABS
200.0000 mg | ORAL_TABLET | Freq: Two times a day (BID) | ORAL | Status: DC
  Administered 2020-11-25 (×2): 200 mg via ORAL
  Filled 2020-11-25 (×2): qty 1

## 2020-11-25 MED ORDER — INSULIN ASPART 100 UNIT/ML ~~LOC~~ SOLN: 0.0000 [IU] | Freq: Every day | SUBCUTANEOUS | Status: DC

## 2020-11-25 MED ORDER — DEXTROSE IN LACTATED RINGERS 5 % IV SOLN: INTRAVENOUS | Status: DC

## 2020-11-25 MED ORDER — INSULIN ASPART 100 UNIT/ML ~~LOC~~ SOLN
3.0000 [IU] | Freq: Three times a day (TID) | SUBCUTANEOUS | Status: DC
  Administered 2020-11-25: 3 [IU] via SUBCUTANEOUS

## 2020-11-25 MED ORDER — ROSUVASTATIN CALCIUM 5 MG PO TABS
10.0000 mg | ORAL_TABLET | Freq: Every day | ORAL | Status: DC
  Administered 2020-11-25 – 2020-11-28 (×4): 10 mg via ORAL
  Filled 2020-11-25 (×4): qty 2

## 2020-11-25 MED ORDER — HEPARIN (PORCINE) 25000 UT/250ML-% IV SOLN
650.0000 [IU]/h | INTRAVENOUS | Status: DC
  Administered 2020-11-25 – 2020-11-27 (×2): 650 [IU]/h via INTRAVENOUS
  Filled 2020-11-25 (×2): qty 250

## 2020-11-25 MED ORDER — PREDNISONE 5 MG PO TABS
15.0000 mg | ORAL_TABLET | Freq: Every day | ORAL | Status: DC
  Administered 2020-11-26 – 2020-11-28 (×3): 15 mg via ORAL
  Filled 2020-11-25 (×3): qty 1

## 2020-11-25 MED ORDER — INSULIN ASPART 100 UNIT/ML ~~LOC~~ SOLN: 0.0000 [IU] | Freq: Three times a day (TID) | SUBCUTANEOUS | Status: DC

## 2020-11-25 MED ORDER — INSULIN GLARGINE 100 UNIT/ML ~~LOC~~ SOLN
10.0000 [IU] | Freq: Every day | SUBCUTANEOUS | Status: DC
  Administered 2020-11-25: 10 [IU] via SUBCUTANEOUS
  Filled 2020-11-25: qty 0.1

## 2020-11-25 MED ORDER — DEXTROSE 50 % IV SOLN
0.0000 mL | INTRAVENOUS | Status: DC | PRN
Start: 2020-11-25 — End: 2020-11-28

## 2020-11-25 MED ORDER — INSULIN REGULAR(HUMAN) IN NACL 100-0.9 UT/100ML-% IV SOLN
INTRAVENOUS | Status: DC
  Administered 2020-11-25: 10 [IU]/h via INTRAVENOUS
  Filled 2020-11-25: qty 100

## 2020-11-25 MED ORDER — ADENOSINE 6 MG/2ML IV SOLN
6.0000 mg | Freq: Once | INTRAVENOUS | Status: AC
  Administered 2020-11-25: 6 mg via INTRAVENOUS

## 2020-11-25 MED ORDER — ASPIRIN EC 81 MG PO TBEC
81.0000 mg | DELAYED_RELEASE_TABLET | Freq: Every day | ORAL | Status: DC
  Administered 2020-11-25 – 2020-11-26 (×2): 81 mg via ORAL
  Filled 2020-11-25: qty 1

## 2020-11-25 MED ORDER — POLYVINYL ALCOHOL 1.4 % OP SOLN
1.0000 [drp] | OPHTHALMIC | Status: DC | PRN
  Filled 2020-11-25: qty 15

## 2020-11-25 MED ORDER — DONEPEZIL HCL 5 MG PO TABS
5.0000 mg | ORAL_TABLET | Freq: Every day | ORAL | Status: DC
  Administered 2020-11-25 – 2020-11-27 (×3): 5 mg via ORAL
  Filled 2020-11-25 (×4): qty 1

## 2020-11-25 MED ORDER — HEPARIN BOLUS VIA INFUSION
3000.0000 [IU] | Freq: Once | INTRAVENOUS | Status: AC
  Administered 2020-11-25: 3000 [IU] via INTRAVENOUS
  Filled 2020-11-25: qty 3000

## 2020-11-25 MED ORDER — INSULIN ASPART 100 UNIT/ML ~~LOC~~ SOLN
15.0000 [IU] | Freq: Once | SUBCUTANEOUS | Status: AC
  Administered 2020-11-25: 15 [IU] via SUBCUTANEOUS

## 2020-11-25 NOTE — H&P (Addendum)
Sandy Owens is an 87 y.o. female.   Chief Complaint: Palpitations HPI:    87 y.o.Caucasianfemalewith hyperlipidemia, type 2 diabetes mellitus, peripheral neuropathy, CAD s/p CABG, h/o breast cancer, admitted with palpitations.  Patient has been having several episodes of tachycardia over the last few weeks.  She was given Zio patch monitor, which showed multiple episodes of SVT, longest lasting 5 hours 51 minutes.  I started on:, With no improvement in symptoms.  This morning, patient had an episode of tachycardia lasting for close to 3 hours.  She was brought to ER where EKG showed SVT with inferior ST elevation.  Code STEMI was initially called, but canceled given that ST segments were likely rate related.  Patient's tachycardia resolved with 6 mg adenosine.  Patient is currently symptom-free.  Daughter is concerned about patient's recurrent episodes that are affecting her quality of life.  She is worried about another such episode happening while at home.  Past Medical History:  Diagnosis Date  . Breast cancer ([place])    left  . Cancer [facility])    left breast  . Coronary artery disease    CABG - 3- 2001  . Diabetes mellitus without complication ([place])   . High cholesterol   . Hypertension   . Hypothyroidism   . Neuromuscular disorder ([place])    neuropathy in feet  and legs    Past Surgical History:  Procedure Laterality Date  . ABDOMINAL HYSTERECTOMY    . APPENDECTOMY    . BACK SURGERY    . cabg    . CARDIAC SURGERY     Bypass  . HIP SURGERY      right hip replacement  . JOINT REPLACEMENT     left knee replacement  . MASTECTOMY Left   . MULTIPLE EXTRACTIONS WITH ALVEOLOPLASTY N/A 11/28/2017   Procedure: MULTIPLE EXTRACTION WITH ALVEOLOPLASTY;  Surgeon: Diona Browner, DDS;  Location: [facility];  Service: Oral Surgery;  Laterality: N/A;  . SIMPLE MASTECTOMY WITH AXILLARY SENTINEL NODE BIOPSY Left 06/06/2014   Procedure: LEFT SIMPLE MASTECTOMY;  Surgeon: Excell Seltzer,  MD;  Location: [facility];  Service: General;  Laterality: Left;     Family History  Problem Relation Age of Onset  . Heart Problems Mother   . Leukemia Father   . Breast cancer Neg Hx     Social History:  reports that she quit smoking about 42 years ago. Her smoking use included cigarettes. She has a 5.00 pack-year smoking history. She has never used smokeless tobacco. She reports current alcohol use. She reports that she does not use drugs.  Allergies:  Allergies  Allergen Reactions  . Tape Other (See Comments)    TAPE TEARS AND BRUISES THE SKIN VERY EASILY- PATIENT HAS VERY THIN SKIN!!  . Lisinopril Hives  . Statins Other (See Comments)    Muscle pain    Review of Systems  Constitutional: Negative for decreased appetite, malaise/fatigue, weight gain and weight loss.  HENT: Negative for congestion.   Eyes: Negative for visual disturbance.  Cardiovascular: Positive for palpitations (Now resolved). Negative for chest pain, dyspnea on exertion, leg swelling and syncope.  Respiratory: Positive for shortness of breath (Now resolved). Negative for cough.   Endocrine: Negative for cold intolerance.  Hematologic/Lymphatic: Does not bruise/bleed easily.  Skin: Negative for itching and rash.  Musculoskeletal: Negative for myalgias.  Gastrointestinal: Negative for abdominal pain, nausea and vomiting.  Genitourinary: Negative for dysuria.  Neurological: Negative for dizziness and weakness.  Psychiatric/Behavioral: The patient is  not nervous/anxious.   All other systems reviewed and are negative.    Blood pressure (!) 137/54, pulse 72, temperature 97.9 F (36.6 C), temperature source Oral, resp. rate (!) 25, height 5\' 1"  (1.549 m), weight 55 kg, SpO2 100 %. Body mass index is 22.91 kg/m.  Physical Exam Vitals and nursing note reviewed.  Constitutional:      General: She is not in acute distress.    Appearance: She is well-developed.  HENT:     Head:  Normocephalic and atraumatic.  Eyes:     Conjunctiva/sclera: Conjunctivae normal.     Pupils: Pupils are equal, round, and reactive to light.  Neck:     Vascular: No JVD.  Cardiovascular:     Rate and Rhythm: Normal rate and regular rhythm.     Pulses: Intact distal pulses. Decreased pulses.     Heart sounds: Murmur heard.   Harsh midsystolic murmur is present with a grade of 2/6 at the upper right sternal border radiating to the neck.   Pulmonary:     Effort: Pulmonary effort is normal.     Breath sounds: Normal breath sounds. No wheezing or rales.  Abdominal:     General: Bowel sounds are normal.     Palpations: Abdomen is soft.     Tenderness: There is no rebound.  Musculoskeletal:        General: No tenderness. Normal range of motion.     Left lower leg: No edema.  Lymphadenopathy:     Cervical: No cervical adenopathy.  Skin:    General: Skin is warm and dry.  Neurological:     Mental Status: She is alert and oriented to person, place, and time.     Cranial Nerves: No cranial nerve deficit.     Results for orders placed or performed during the hospital encounter of 11/25/20 (from the past 48 hour(s))  CBG monitoring, ED     Status: Abnormal   Collection Time: 11/25/20 10:50 AM  Result Value Ref Range   Glucose-Capillary 500 (H) 70 - 99 mg/dL    Comment: Glucose reference range applies only to samples taken after fasting for at least 8 hours.   Comment 1 Notify RN    Comment 2 Document in Chart   Comprehensive metabolic panel     Status: Abnormal   Collection Time: 11/25/20 10:55 AM  Result Value Ref Range   Sodium 128 (L) 135 - 145 mmol/L   Potassium 5.2 (H) 3.5 - 5.1 mmol/L   Chloride 91 (L) 98 - 111 mmol/L   CO2 25 22 - 32 mmol/L   Glucose, Bld 498 (H) 70 - 99 mg/dL    Comment: Glucose reference range applies only to samples taken after fasting for at least 8 hours.   BUN 25 (H) 8 - 23 mg/dL   Creatinine, Ser 1.22 (H) 0.44 - 1.00 mg/dL   Calcium 8.7 (L) 8.9 -  10.3 mg/dL   Total Protein 5.9 (L) 6.5 - 8.1 g/dL   Albumin 2.4 (L) 3.5 - 5.0 g/dL   AST 59 (H) 15 - 41 U/L   ALT 51 (H) 0 - 44 U/L   Alkaline Phosphatase 126 38 - 126 U/L   Total Bilirubin 0.8 0.3 - 1.2 mg/dL   GFR, Estimated 41 (L) >60 mL/min    Comment: (NOTE) Calculated using the CKD-EPI Creatinine Equation (2021)    Anion gap 12 5 - 15    Comment: Performed at [facility], [place] [address]  [place], [place] [address]  Troponin I (High Sensitivity)     Status: Abnormal   Collection Time: 11/25/20 10:55 AM  Result Value Ref Range   Troponin I (High Sensitivity) 251 (HH) <18 ng/L    Comment: CRITICAL RESULT CALLED TO, READ BACK BY AND VERIFIED WITH: CRYSTAL BAIN RN.@1217  ON 3.26.22 BY TCALDWELL MT. (NOTE) Elevated high sensitivity troponin I (hsTnI) values and significant  changes across serial measurements may suggest ACS but many other  chronic and acute conditions are known to elevate hsTnI results.  Refer to the Links section for chest pain algorithms and additional  guidance. Performed at [facility], [place] [address] [place], [place] [address]   Protime-INR     Status: None   Collection Time: 11/25/20 10:55 AM  Result Value Ref Range   Prothrombin Time 13.2 11.4 - 15.2 seconds   INR 1.0 0.8 - 1.2    Comment: (NOTE) INR goal varies based on device and disease states. Performed at [facility], [place] [address] [place], [place] [address]   Resp Panel by RT-PCR (Flu A&B, Covid) Nasopharyngeal Swab     Status: None   Collection Time: 11/25/20 11:02 AM   Specimen: Nasopharyngeal Swab; Nasopharyngeal(NP) swabs in vial transport medium  Result Value Ref Range   SARS Coronavirus 2 by RT PCR NEGATIVE NEGATIVE    Comment: (NOTE) SARS-CoV-2 target nucleic acids are NOT DETECTED.  The SARS-CoV-2 RNA is generally detectable in upper respiratory specimens during the acute phase of infection. The lowest concentration of SARS-CoV-2 viral copies this assay can  detect is 138 copies/mL. A negative result does not preclude SARS-Cov-2 infection and should not be used as the sole basis for treatment or other patient management decisions. A negative result may occur with  improper specimen collection/handling, submission of specimen other than nasopharyngeal swab, presence of viral mutation(s) within the areas targeted by this assay, and inadequate number of viral copies(<138 copies/mL). A negative result must be combined with clinical observations, patient history, and epidemiological information. The expected result is Negative.  Fact Sheet for Patients:  EntrepreneurPulse.com.au  Fact Sheet for Healthcare Providers:  IncredibleEmployment.be  This test is no t yet approved or cleared by the [place] FDA and  has been authorized for detection and/or diagnosis of SARS-CoV-2 by FDA under an Emergency Use Authorization (EUA). This EUA will remain  in effect (meaning this test can be used) for the duration of the COVID-19 declaration under Section 564(b)(1) of the Act, 21 U.S.C.section 360bbb-3(b)(1), unless the authorization is terminated  or revoked sooner.       Influenza A by PCR NEGATIVE NEGATIVE   Influenza B by PCR NEGATIVE NEGATIVE    Comment: (NOTE) The Xpert Xpress SARS-CoV-2/FLU/RSV plus assay is intended as an aid in the diagnosis of influenza from Nasopharyngeal swab specimens and should not be used as a sole basis for treatment. Nasal washings and aspirates are unacceptable for Xpert Xpress SARS-CoV-2/FLU/RSV testing.  Fact Sheet for Patients: EntrepreneurPulse.com.au  Fact Sheet for Healthcare Providers: IncredibleEmployment.be  This test is not yet approved or cleared by the [place] FDA and has been authorized for detection and/or diagnosis of SARS-CoV-2 by FDA under an Emergency Use Authorization (EUA). This EUA will remain in effect  (meaning this test can be used) for the duration of the COVID-19 declaration under Section 564(b)(1) of the Act, 21 U.S.C. section 360bbb-3(b)(1), unless the authorization is terminated or revoked.  Performed at [facility], [place] [address],  [place], [place] [address]   TSH     Status: Abnormal   Collection Time: 11/25/20 12:18 PM  Result Value Ref Range   TSH 6.807 (H) 0.350 - 4.500 uIU/mL    Comment: Performed by a 3rd Generation assay with a functional sensitivity of <=0.01 uIU/mL. Performed at [facility], [place] [address] [place], [place] [address]   CBC with Differential     Status: Abnormal   Collection Time: 11/25/20 12:50 PM  Result Value Ref Range   WBC 9.3 4.0 - 10.5 K/uL   RBC 3.87 3.87 - 5.11 MIL/uL   Hemoglobin 10.5 (L) 12.0 - 15.0 g/dL   HCT 32.8 (L) 36.0 - 46.0 %   MCV 84.8 80.0 - 100.0 fL   MCH 27.1 26.0 - 34.0 pg   MCHC 32.0 30.0 - 36.0 g/dL   RDW 18.5 (H) 11.5 - 15.5 %   Platelets 199 150 - 400 K/uL   nRBC 0.0 0.0 - 0.2 %   Neutrophils Relative % 77 %   Neutro Abs 7.1 1.7 - 7.7 K/uL   Lymphocytes Relative 15 %   Lymphs Abs 1.4 0.7 - 4.0 K/uL   Monocytes Relative 6 %   Monocytes Absolute 0.5 0.1 - 1.0 K/uL   Eosinophils Relative 0 %   Eosinophils Absolute 0.0 0.0 - 0.5 K/uL   Basophils Relative 0 %   Basophils Absolute 0.0 0.0 - 0.1 K/uL   Immature Granulocytes 2 %   Abs Immature Granulocytes 0.18 (H) 0.00 - 0.07 K/uL    Comment: Performed at [facility] [address], [place], [place] [address]  Troponin I (High Sensitivity)     Status: Abnormal   Collection Time: 11/25/20 12:50 PM  Result Value Ref Range   Troponin I (High Sensitivity) 1,236 (HH) <18 ng/L    Comment: CRITICAL VALUE NOTED.  VALUE IS CONSISTENT WITH PREVIOUSLY REPORTED AND CALLED VALUE. (NOTE) Elevated high sensitivity troponin I (hsTnI) values and significant  changes across serial measurements may suggest ACS but many other  chronic and acute conditions are  known to elevate hsTnI results.  Refer to the Links section for chest pain algorithms and additional  guidance. Performed at [facility], [place] [address], [place], [place] [address]   Glucose, capillary     Status: Abnormal   Collection Time: 11/25/20  1:47 PM  Result Value Ref Range   Glucose-Capillary 475 (H) 70 - 99 mg/dL    Comment: Glucose reference range applies only to samples taken after fasting for at least 8 hours.    Labs:   Lab Results  Component Value Date   WBC 9.3 11/25/2020   HGB 10.5 (L) 11/25/2020   HCT 32.8 (L) 11/25/2020   MCV 84.8 11/25/2020   PLT 199 11/25/2020    Recent Labs  Lab 11/25/20 1055  NA 128*  K 5.2*  CL 91*  CO2 25  BUN 25*  CREATININE 1.22*  CALCIUM 8.7*  PROT 5.9*  BILITOT 0.8  ALKPHOS 126  ALT 51*  AST 59*  GLUCOSE 498*    Lipid Panel  No results found for: CHOL, TRIG, HDL, CHOLHDL, VLDL, LDLCALC  BNP (last 3 results) Recent Labs    10/26/20 1315  BNP 340.1*    HEMOGLOBIN A1C Pending  Cardiac Panel (last 3 results) Results for ALEXY, BRINGLE (MRN [id number]) as of 11/25/2020 14:23  Ref. Range 11/25/2020 10:55 11/25/2020 12:50  Troponin I (High Sensitivity) Latest Ref Range: <18 ng/L 251 (HH)  1,236 ([place])    TSH Recent Labs    06/06/20 1623 10/26/20 1315 11/25/20 1218  TSH 0.95 2.360 6.807*     Medications Prior to Admission  Medication Sig Dispense Refill  . Artificial Tear Ointment (ARTIFICIAL TEARS) ointment Place 1 application into both eyes as needed (dry eyes).    Marland Kitchen aspirin 81 MG tablet Take 81 mg by mouth daily with supper.    . Calcium Carb-Cholecalciferol (CALCIUM+D3 PO) Take 1 tablet by mouth daily with breakfast.    . Cholecalciferol (VITAMIN D3) 50 MCG (2000 UT) capsule Take 1 capsule (2,000 Units total) by mouth daily. 100 capsule 3  . CINNAMON PO Take 2,000 mg by mouth daily.    Marland Kitchen Dextran 70-Hypromellose (ARTIFICIAL TEARS PF OP) Place 1 drop into both eyes 3 (three) times daily as  needed (for dryness).    . Multiple Vitamins-Minerals (PRESERVISION AREDS 2 PO) Take 1 capsule by mouth in the morning and at bedtime.    . NON FORMULARY Take 1 capsule by mouth See admin instructions. [facility], Memory and nerve support capsules- Take 2 capsules by mouth once a day    . Omega-3 Fatty Acids (FISH OIL PO) Take 1,400 mg by mouth 2 (two) times daily.     Marland Kitchen amLODipine (NORVASC) 5 MG tablet TAKE 1 TABLET(5 MG) BY MOUTH DAILY 90 tablet 3  . donepezil (ARICEPT) 5 MG tablet Take 1 tablet (5 mg total) by mouth at bedtime. 90 tablet 3  . ivabradine (CORLANOR) 5 MG TABS tablet Take 1 tablet (5 mg total) by mouth 2 (two) times daily with a meal. 60 tablet 3  . levothyroxine (SYNTHROID) 88 MCG tablet TAKE 1 TABLET(88 MCG) BY MOUTH DAILY 90 tablet 3  . predniSONE (DELTASONE) 5 MG tablet Take 3 tablets (15 mg total) by mouth daily with breakfast. 90 tablet 5      Current Facility-Administered Medications:  .  0.9 %  sodium chloride infusion, , Intravenous, Continuous, Fredia Sorrow, MD, Last Rate: 75 mL/hr at 11/25/20 1118, New Bag at 11/25/20 1118 .  amiodarone (PACERONE) tablet 200 mg, 200 mg, Oral, BID, Patwardhan, Manish J, MD .  amLODipine (NORVASC) tablet 5 mg, 5 mg, Oral, Daily, Patwardhan, Manish J, MD .  aspirin EC tablet 81 mg, 81 mg, Oral, Q supper, Patwardhan, Manish J, MD .  donepezil (ARICEPT) tablet 5 mg, 5 mg, Oral, QHS, Patwardhan, Manish J, MD .  heparin injection 5,000 Units, 5,000 Units, Subcutaneous, Q8H, Patwardhan, Manish J, MD .  Derrill Memo ON 11/26/2020] levothyroxine (SYNTHROID) tablet 88 mcg, 88 mcg, Oral, Q0600, Patwardhan, Manish J, MD .  polyvinyl alcohol (LIQUIFILM TEARS) 1.4 % ophthalmic solution 1 drop, 1 drop, Both Eyes, PRN, Patwardhan, Manish J, MD .  Derrill Memo ON 11/26/2020] predniSONE (DELTASONE) tablet 15 mg, 15 mg, Oral, Q breakfast, Patwardhan, Manish J, MD   Today's Vitals   11/25/20 1200 11/25/20 1230 11/25/20 1254 11/25/20 1300  BP: (!) 134/58 (!)  134/56  (!) 137/54  Pulse: 77 72    Resp: (!) 24 (!) 25    Temp:   98.2 F (36.8 C) 97.9 F (36.6 C)  TempSrc:   Oral Oral  SpO2: 99% 100%    Weight:    55 kg  Height:    5\' 1"  (1.549 m)  PainSc:    0-No pain   Body mass index is 22.91 kg/m.   CARDIAC STUDIES:  EKG 11/25/2020 11:20 AM: Sinus rhythm 70 bpm.  Right bundle branch block, left posterior fascicular block.  Minimal ST elevation in inferior leads, not STEMI   EKG 11/25/2020 10:48 AM: Supraventricular tachycardia, RBBB, LPFB Diffuse ST segment changes, consider ischemia  Echocardiogram pending:   Assessment/Plan  87 y.o.Caucasianfemalewith hyperlipidemia, type 2 diabetes mellitus, peripheral neuropathy, CAD s/p CABG, h/o breast cancer, admitted with palpitations.  Tachycardia: Supraventricular tachycardia.  Conversion with 6 mg adenosine. Discussed with on-call EP Dr. Caryl Comes.   Close review of telemetry, as well as Zio patch tracing suggests atrial tachycardia as the most likely rhythm. Given her advanced age, less likely to be a candidate for ablation. At initially started her on Corlanor, given that I wanted to avoid flecainide due to presence of CAD, and other antiarrhythmic agents due to concern for causing bradycardia arrhythmia in any 87 year old patient. On discussion with Dr. Caryl Comes, decided to use amiodarone instead of Corlanor. Started amiodarone 200 mg twice daily. Normal TSH at baseline.  Mild liver enzyme elevation, will monitor. Echocardiogram pending.  We will monitor patient overnight for any recurrence of tachycardia episodes.  In case of recurrence, will use adenosine while inpatient.  Troponin elevation: Known CAD, s/p CABG several years ago Likely demand ischemia in the setting of tachycardia episodes. That said, troponin has increased from 251 to 1236.  ACS cannot be excluded. Patient wants to avoid invasive management.  Continue conservative medical management. Will treat with IV heparin.  Continue aspirin, plavix (without loading), amlodipine. Added rosuvastatin 10 mg. Hold off beta-blocker at this time, given initiation of amiodarone and recent use of corlanor.  Will consider adding tomorrow.  Aortic stenosis: Mild to moderate at baseline.  Will obtain echocardiogram.  Hyperglycemia: Likely medication induced due to prednisone, which she has been on 15 mg for last 3-4 weeks for back pain. BG >400 today. Will add insulin coverage.  I have encouraged the daughter to speak with her PCP regarding alternate options for prednisone for outpatient management of diabetes.  Dementia: I encouraged patient's daughter to stay in the hospital with the patient.  Hopefully, this will avoid any hospital delirium.  Continue donepezil.  Anticipated length of stay: 2 days   Nigel Mormon, MD Pager: [phone] Office: [phone]

## 2020-11-25 NOTE — Progress Notes (Signed)
Called rapid response nurse Mindy, RN at [phone] and made her aware of situation heart rate 134 at this time.

## 2020-11-25 NOTE — ED Provider Notes (Addendum)
[facility] EMERGENCY DEPARTMENT Provider Note   CSN: [id number] Arrival date & time: 11/25/20  1043     History Chief Complaint  Patient presents with  . Code STEMI    Sandy Owens is a 87 y.o. female.  Patient brought in by EMS.  Concern was for rapid heart rate.  And possible inferior MI.  EMS had transmitted the EKG.  But it was a lot of writing over the top where there was concerns for possible anterior infarct.  There certainly was some ST segment depression throughout.  Repeat EKG upon arrival here consistent with supraventricular tachycardia with heart rate of 158.  Did have ST segment elevation inferiorly.  But patient did not have any chest pain.  Discussed with on-call STEMI doctor.  Recommended just doing the work-up.  Chart review showed that she is followed by [facility] cardiology.  Chart review showed that patient been having some difficulty with rapid heart rate since about February.  And had a medicine adjustment done at that time and then another medicine adjustment done about 3 4 days ago.  Patient has a history of coronary artery disease.  Had a CABG in the past has a well-healed chest scar.  Patient without any chest pain.  Patient is a DNR.        Past Medical History:  Diagnosis Date  . Breast cancer ([place])    left  . Cancer [facility])    left breast  . Coronary artery disease    CABG - 3- 2001  . Diabetes mellitus without complication ([place])   . High cholesterol   . Hypertension   . Hypothyroidism   . Neuromuscular disorder ([place])    neuropathy in feet  and legs    Patient Active Problem List   Diagnosis Date Noted  . Tachycardia 11/08/2020  . Near syncope 11/08/2020  . Bursitis, trochanteric 09/06/2020  . Arthralgia 09/06/2020  . Memory loss 07/18/2020  . Aortic stenosis, moderate 06/07/2020  . Exertional dyspnea 06/07/2020  . Urticaria 06/06/2020  . Abnormal EKG 05/17/2020  . Pain due to onychomycosis of toenails of both  feet 03/10/2019  . SIADH (syndrome of inappropriate ADH production) ([place])   . Labile blood glucose   . Diabetes mellitus type 2 in obese ([place])   . AKI (acute kidney injury) ([place])   . Focal traumatic brain injury with LOC of 30 minutes or less, sequela ([place]) 12/03/2017  . Hypothyroidism   . History of gout   . Urinary retention   . Fall   . Hypoxia   . Lip laceration   . Pain   . Subarachnoid hemorrhage ([place])   . History of breast cancer   . Coronary artery disease involving coronary bypass graft of native heart without angina pectoris   . Benign essential HTN   . Type 2 diabetes mellitus with peripheral neuropathy (HCC)   . Acute idiopathic gout of right foot   . Oropharyngeal dysphagia   . Leukocytosis   . Acute blood loss anemia   . Stage 3 chronic kidney disease ([place])   . Multiple trauma 11/26/2017  . Breast cancer ([place]) 06/06/2014  . Cancer of breast, female ([place]) 04/13/2014    Past Surgical History:  Procedure Laterality Date  . ABDOMINAL HYSTERECTOMY    . APPENDECTOMY    . BACK SURGERY    . cabg    . CARDIAC SURGERY     Bypass  . HIP SURGERY      right hip  replacement  . JOINT REPLACEMENT     left knee replacement  . MASTECTOMY Left   . MULTIPLE EXTRACTIONS WITH ALVEOLOPLASTY N/A 11/28/2017   Procedure: MULTIPLE EXTRACTION WITH ALVEOLOPLASTY;  Surgeon: Diona Browner, DDS;  Location: [facility];  Service: Oral Surgery;  Laterality: N/A;  . SIMPLE MASTECTOMY WITH AXILLARY SENTINEL NODE BIOPSY Left 06/06/2014   Procedure: LEFT SIMPLE MASTECTOMY;  Surgeon: Excell Seltzer, MD;  Location: [facility];  Service: General;  Laterality: Left;     OB History   No obstetric history on file.     Family History  Problem Relation Age of Onset  . Heart Problems Mother   . Leukemia Father   . Breast cancer Neg Hx     Social History   Tobacco Use  . Smoking status: Former Smoker    Packs/day: 0.25    Years: 20.00    Pack years: 5.00    Types: Cigarettes     Quit date: 1980    Years since quitting: 42.2  . Smokeless tobacco: Never Used  . Tobacco comment: quit 40 years ago   Vaping Use  . Vaping Use: Never used  Substance Use Topics  . Alcohol use: Yes    Comment: occasional - drinks wine  . Drug use: No    Home Medications Prior to Admission medications   Medication Sig Start Date End Date Taking? Authorizing Provider  amLODipine (NORVASC) 5 MG tablet TAKE 1 TABLET(5 MG) BY MOUTH DAILY 06/06/20   Plotnikov, Evie Lacks, MD  Artificial Tear Ointment (ARTIFICIAL TEARS) ointment Place 1 application into both eyes as needed (dry eyes).    [provider]  aspirin 81 MG tablet Take 81 mg by mouth daily.    [provider]  Calcium Carbonate-Vit D-Min (GNP CALCIUM PLUS 600 +D PO) Take 1 tablet by mouth.    [provider]  Cholecalciferol (VITAMIN D3) 50 MCG (2000 UT) capsule Take 1 capsule (2,000 Units total) by mouth daily. 07/18/20   Plotnikov, Evie Lacks, MD  CINNAMON PO Take 2,000 mg by mouth daily.    [provider]  donepezil (ARICEPT) 5 MG tablet Take 1 tablet (5 mg total) by mouth at bedtime. 07/19/20   Plotnikov, Evie Lacks, MD  ivabradine (CORLANOR) 5 MG TABS tablet Take 1 tablet (5 mg total) by mouth 2 (two) times daily with a meal. 11/24/20   Patwardhan, Manish J, MD  levothyroxine (SYNTHROID) 88 MCG tablet TAKE 1 TABLET(88 MCG) BY MOUTH DAILY 06/06/20   Plotnikov, Evie Lacks, MD  Multiple Vitamins-Minerals (PRESERVISION AREDS 2 PO) Take by mouth.    [provider]  NON FORMULARY Take 1 tablet by mouth daily. [facility], Memory and nerve support    [provider]  Omega-3 Fatty Acids (FISH OIL PO) Take 1,400 mg by mouth 2 (two) times daily.     [provider]  predniSONE (DELTASONE) 5 MG tablet Take 3 tablets (15 mg total) by mouth daily with breakfast. 10/23/20   Plotnikov, Evie Lacks, MD    Allergies    Lisinopril and Statins  Review of Systems   Review of Systems   Unable to perform ROS: Dementia  Cardiovascular: Positive for chest pain and palpitations.    Physical Exam Updated Vital Signs BP (!) 118/59   Pulse 71   Temp 97.7 F (36.5 C) (Oral)   Resp (!) 23   Ht 1.549 m (5\' 1" )   Wt 55 kg   SpO2 99%   BMI 22.91  kg/m   Physical Exam Vitals and nursing note reviewed.  Constitutional:      General: She is in acute distress.     Appearance: Normal appearance. She is well-developed.  HENT:     Head: Normocephalic and atraumatic.     Mouth/Throat:     Mouth: Mucous membranes are moist.  Eyes:     Extraocular Movements: Extraocular movements intact.     Conjunctiva/sclera: Conjunctivae normal.     Pupils: Pupils are equal, round, and reactive to light.  Cardiovascular:     Rate and Rhythm: Regular rhythm. Tachycardia present.     Heart sounds: No murmur heard.   Pulmonary:     Effort: Pulmonary effort is normal. No respiratory distress.     Breath sounds: Normal breath sounds.  Abdominal:     Palpations: Abdomen is soft.     Tenderness: There is no abdominal tenderness.  Musculoskeletal:        General: No swelling.     Cervical back: Neck supple.     Right lower leg: No edema.     Left lower leg: No edema.  Skin:    General: Skin is warm and dry.     Capillary Refill: Capillary refill takes less than 2 seconds.  Neurological:     General: No focal deficit present.     Mental Status: She is alert. Mental status is at baseline.     ED Results / Procedures / Treatments   Labs (all labs ordered are listed, but only abnormal results are displayed) Labs Reviewed  CBG MONITORING, ED - Abnormal; Notable for the following components:      Result Value   Glucose-Capillary 500 (*)    All other components within normal limits  RESP PANEL BY RT-PCR (FLU A&B, COVID) [id number]  PROTIME-INR  CBC WITH DIFFERENTIAL/PLATELET  COMPREHENSIVE METABOLIC PANEL  TSH  TROPONIN I (HIGH SENSITIVITY)    EKG EKG  Interpretation  Date/Time:  Saturday November 25 2020 10:48:40 EDT Ventricular Rate:  158 PR Interval:    QRS Duration: 114 QT Interval:  308 QTC Calculation: 500 R Axis:   133 Text Interpretation: Supraventricular tachycardia IRBBB and LPFB Abnormal R-wave progression, late transition Inferior infarct, acute (RCA) Probable RV involvement, suggest recording right precordial leads >>> Acute MI <<< Confirmed by Fredia Sorrow [phone]) on 11/25/2020 11:46:31 AM   Radiology DG Chest Port 1 View  Result Date: 11/25/2020 CLINICAL DATA:  Rapid heart rate. EXAM: PORTABLE CHEST 1 VIEW COMPARISON:  10/26/2020 FINDINGS: Previous median sternotomy and CABG procedure. Aortic atherosclerosis. Asymmetric elevation of right hemidiaphragm. No interstitial edema or airspace opacities. The bones appear diffusely osteopenic. Degenerative changes are noted at the left glenohumeral joint. IMPRESSION: No acute abnormality. Aortic Atherosclerosis (ICD10-I70.0). Electronically Signed   By: Kerby Moors M.D.   On: 11/25/2020 11:11    Procedures Procedures  CRITICAL CARE Performed by: Fredia Sorrow Total critical care time: 45 minutes Critical care time was exclusive of separately billable procedures and treating other patients. Critical care was necessary to treat or prevent imminent or life-threatening deterioration. Critical care was time spent personally by me on the following activities: development of treatment plan with patient and/or surrogate as well as nursing, discussions with consultants, evaluation of patient's response to treatment, examination of patient, obtaining history from patient or surrogate, ordering and performing treatments and interventions, ordering and review of laboratory studies, ordering and review of radiographic studies, pulse oximetry and re-evaluation of patient's condition.    Medications Ordered in ED  Medications  0.9 %  sodium chloride infusion ( Intravenous New Bag/Given  11/25/20 1118)  adenosine (ADENOCARD) 6 MG/2ML injection 6 mg (6 mg Intravenous Given 11/25/20 1119)    ED Course  I have reviewed the triage vital signs and the nursing notes.  Pertinent labs & imaging results that were available during my care of the patient were reviewed by me and considered in my medical decision making (see chart for details).    MDM Rules/Calculators/A&P                          Was able to speak to her cardiologist Patwardhan patient's blood pressures were systolic [phone].  He was okay with giving adenosine.  Patient received 6 mg of adenosine while being monitored and also was on oxygen.  Patient converted back to sinus rhythm.  So most likely this was SVT.  He is coming in to see the patient patient and family are willing to get admitted.  Which seems to make sense of some been a big struggle with getting this under control.  Most likely the initial EKG with the inferior changes with rate related.  Because once she converted they converted EKG was consistent with a right bundle branch block left posterior fascicular block and minimal ST elevation in the inferior leads.  He also verified patient is not on blood thinners.  Patient takes a baby aspirin a day.  Patient got labs chest x-ray and Covid testing in preparation for the admission.     Final Clinical Impression(s) / ED Diagnoses Final diagnoses:  SVT (supraventricular tachycardia) ([place])  Precordial pain    Rx / DC Orders ED Discharge Orders    None       Fredia Sorrow, MD 11/25/20 1146    Fredia Sorrow, MD 11/25/20 1147

## 2020-11-25 NOTE — Progress Notes (Signed)
Pt's CBG = 536.  She has history of DM II and has been off of her po meds for "quite some time".  Has also been on 15 mg of Prednisone daily for the past 6 weeks.  MD notified.  See NO.

## 2020-11-25 NOTE — Progress Notes (Signed)
  Echocardiogram 2D Echocardiogram with 3D has been performed.  Sandy Owens M 11/25/2020, 2:56 PM

## 2020-11-25 NOTE — Consult Note (Addendum)
[facility] Medical Consultation  Cathlyn Tersigni [facility] UKG:[id number] DOB: [birth year] DOA: 11/25/2020 PCP: [name], MD   Requesting physician: Dr. Virgina Jock  Date of consultation: 11/25/2020 Reason for consultation: Hyperglycemia   Impression/Recommendations Active Problems:   Moderate aortic stenosis   Atrial tachycardia (HCC)   Non-ST elevation (NSTEMI) myocardial infarction ([place])    1. Hyperglycemia with hx of Type 2 diabetes not on antidiabetic meds and on  prednisone       - HbA1C of 9.8 today. Last one I could locate in Epic was 04/2020 at 6.6%. Unclear if it is elevated because she has been on prednisone for 6 weeks or if she has been progressively worsening.        - Last BMP earlier this morning showed no anion gap and normal bicarb.        -Initially had planned to follow CBG q2hrs and resistant SSI with additional of Novolog. stat BMP now returning showing upward trend of anion from 12 to 13 and Bicarb is down from 25 to 20 and BG remaining around 400. Will go ahead and start insulin infusion and keep NPO except for medication.  --Monitor and replete potassium as needed. Currently at 5.2. - insulin gtt with goal of 140-180 and AG <12 - IV NS until BG <250, then switch to D5 1/2 NS  - BMP q4hr        - Discussed with daughter that alternative regimen for pt's lower back and advise to work with PCP on slow taper of prednisone to avoid adrenal insufficiency.   2.  Atrial tachycardia/Elevated Troponin       Management per primary cardiology team      I will follow closely overnight and again tomorrow. Please contact me if I can be of assistance in the meanwhile. Thank you for this consultation.  Chief Complaint: SVT  HPI:  Mr. Saia 87 yo F with CAD s/p CABG, hypertension, Type 2 DM w/peripheral neuropathy, hypothyroidism, history of subarachnoid hemorrhage, CKD stage III, history of breast cancer, hyperlipidemia, chronic back pain, and cognitive  impairment who presented with an atrial tachycardia.  Patient had been under Zio patch monitor by cardiology outpatient and presented with palpitation. Patient had conversion back to normal sinus with adenosine in the ER and was admitted and managed by cardiology.  Patient noted to have hyperglycemia during this admission and hospitalist was consulted by cardiology for management.  Patient has history of type 2 diabetes with peripheral neuropathy and is currently not on any oral antidiabetic medication.  She was placed on prednisone about 6 weeks ago for worsening of her chronic low back pain and hip pain by her PCP with no plans for discontinuation.  She was noted to be hyperglycemic here with blood glucose up to 500.  Last BMP this morning shows no anion gap and a normal CO2.  She has so far received13 units of NovoLog at 1715 for BG of 536 with only mild improvement an hour later with BG of 502.  Also received 10 units of Lantus at 1800. Hemoglobin A1c at this admission of 9.8 from a prior of 6.6 back in 04/2020.   Review of Systems: Constitutional: No Weight Change, No Fever ENT/Mouth: No sore throat, No Rhinorrhea Eyes: No Eye Pain, No Vision Changes Cardiovascular: No Chest Pain, no SO Respiratory: No Cough, No Sputum, Gastrointestinal: No Nausea, No Vomiting, No Diarrhea, No Constipation, No Pain Genitourinary: No Urgency, No Flank Pain Musculoskeletal: No Arthralgias, no Myalgias Skin: No Skin  Lesions, No Pruritus, Neuro: no Weakness, No Numbness Psych: No Anxiety/Panic, No Depression, no decrease appetite Heme/Lymph: No Bruising, No Bleeding  Past Medical History:  Diagnosis Date  . Arthritis   . Breast cancer ([place])    left  . Cancer [facility])    left breast  . Coronary artery disease    CABG - 3- 2001  . Dementia ([place])   . Diabetes mellitus without complication ([place])   . Gout   . High cholesterol   . Hypertension   . Hypothyroidism   . Myocardial infarction ([place])   .  Neuromuscular disorder ([place])    neuropathy in feet  and legs   Past Surgical History:  Procedure Laterality Date  . ABDOMINAL HYSTERECTOMY    . APPENDECTOMY    . BACK SURGERY    . cabg    . CARDIAC CATHETERIZATION    . CARDIAC SURGERY     Bypass  . CORONARY ARTERY BYPASS GRAFT    . EYE SURGERY    . HIP SURGERY      right hip replacement  . JOINT REPLACEMENT     left knee replacement  . JOINT REPLACEMENT      right hip replacement  . MASTECTOMY Left   . MULTIPLE EXTRACTIONS WITH ALVEOLOPLASTY N/A 11/28/2017   Procedure: MULTIPLE EXTRACTION WITH ALVEOLOPLASTY;  Surgeon: Diona Browner, DDS;  Location: [place];  Service: Oral Surgery;  Laterality: N/A;  . SIMPLE MASTECTOMY WITH AXILLARY SENTINEL NODE BIOPSY Left 06/06/2014   Procedure: LEFT SIMPLE MASTECTOMY;  Surgeon: Excell Seltzer, MD;  Location: [place];  Service: General;  Laterality: Left;   Social History:  reports that she quit smoking about 42 years ago. Her smoking use included cigarettes. She has a 5.00 pack-year smoking history. She has never used smokeless tobacco. She reports current alcohol use. She reports that she does not use drugs.  Allergies  Allergen Reactions  . Tape Other (See Comments)    TAPE TEARS AND BRUISES THE SKIN VERY EASILY- PATIENT HAS VERY THIN SKIN!!  . Lisinopril Hives  . Statins Other (See Comments)    Muscle pain   Family History  Problem Relation Age of Onset  . Heart Problems Mother   . Leukemia Father   . Breast cancer Neg Hx     Prior to Admission medications   Medication Sig Start Date End Date Taking? Authorizing Provider  amLODipine (NORVASC) 5 MG tablet TAKE 1 TABLET(5 MG) BY MOUTH DAILY 06/06/20  Yes Plotnikov, Evie Lacks, MD  aspirin 81 MG tablet Take 81 mg by mouth daily with supper.   Yes [provider]  Calcium Carb-Cholecalciferol (CALCIUM+D3 PO) Take 1 tablet by mouth daily with breakfast.   Yes [provider]  Cholecalciferol (VITAMIN  D3) 50 MCG (2000 UT) capsule Take 1 capsule (2,000 Units total) by mouth daily. 07/18/20  Yes Plotnikov, Evie Lacks, MD  CINNAMON PO Take 2,000 mg by mouth daily.   Yes [provider]  Dextran 70-Hypromellose (ARTIFICIAL TEARS PF OP) Place 1 drop into both eyes 3 (three) times daily as needed (for dryness).   Yes [provider]  donepezil (ARICEPT) 5 MG tablet Take 1 tablet (5 mg total) by mouth at bedtime. 07/19/20  Yes Plotnikov, Evie Lacks, MD  levothyroxine (SYNTHROID) 88 MCG tablet TAKE 1 TABLET(88 MCG) BY MOUTH DAILY 06/06/20  Yes Plotnikov, Evie Lacks, MD  Multiple Vitamins-Minerals (PRESERVISION AREDS 2 PO) Take 1 capsule by mouth in the morning and at bedtime.  Yes [provider]  NON FORMULARY Take 1 capsule by mouth See admin instructions. [facility], Memory and nerve support capsules- Take 2 capsules by mouth once a day   Yes [provider]  Omega-3 Fatty Acids (FISH OIL PO) Take 1,400 mg by mouth 2 (two) times daily.    Yes [provider]  predniSONE (DELTASONE) 5 MG tablet Take 3 tablets (15 mg total) by mouth daily with breakfast. 10/23/20  Yes Plotnikov, Evie Lacks, MD  Artificial Tear Ointment (ARTIFICIAL TEARS) ointment Place 1 application into both eyes as needed (dry eyes).    [provider]  ivabradine (CORLANOR) 5 MG TABS tablet Take 1 tablet (5 mg total) by mouth 2 (two) times daily with a meal. 11/24/20   Patwardhan, Reynold Bowen, MD   Physical Exam: Blood pressure (!) 137/54, pulse 70, temperature 97.9 F (36.6 C), temperature source Oral, resp. rate (!) 28, height 5\' 1"  (1.549 m), weight 55 kg, SpO2 100 %. Vitals:   11/25/20 1254 11/25/20 1300  BP:  (!) 137/54  Pulse:  70  Resp:  (!) 28  Temp: 98.2 F (36.8 C) 97.9 F (36.6 C)  SpO2:  100%   General: Comfortable elderly female appearing younger than stated age sitting upright in bed Eyes: PERRL, lids and conjunctivae normal ENMT: Mucous membranes are moist.  Neck:  normal, supple,  Respiratory: Clear to auscultation bilaterally with no wheezing or crackles Cardiovascular:  Tachycardia on telemetry, no murmurs / rubs / gallops. Abdomen: no tenderness, no masses palpated.  Bowel sounds positive.  Musculoskeletal: no clubbing / cyanosis. No joint deformity upper and lower extremities. Good ROM, no contractures. Normal muscle tone.  Skin: no rashes, lesions, ulcers. No induration Neurologic: CN 2-12 grossly intact. Sensation intact. Strength 5/5 in all 4.  Psychiatric: Normal judgment and insight. Alert and oriented x 3. Normal mood.   Labs on Admission:  Basic Metabolic Panel: Recent Labs  Lab 11/25/20 1055  NA 128*  K 5.2*  CL 91*  CO2 25  GLUCOSE 498*  BUN 25*  CREATININE 1.22*  CALCIUM 8.7*   Liver Function Tests: Recent Labs  Lab 11/25/20 1055  AST 59*  ALT 51*  ALKPHOS 126  BILITOT 0.8  PROT 5.9*  ALBUMIN 2.4*   No results for input(s): LIPASE, AMYLASE in the last 168 hours. No results for input(s): AMMONIA in the last 168 hours. CBC: Recent Labs  Lab 11/25/20 1250  WBC 9.3  NEUTROABS 7.1  HGB 10.5*  HCT 32.8*  MCV 84.8  PLT 199   Cardiac Enzymes: No results for input(s): CKTOTAL, CKMB, CKMBINDEX, TROPONINI in the last 168 hours. BNP: Invalid input(s): POCBNP CBG: Recent Labs  Lab 11/25/20 1050 11/25/20 1347 11/25/20 1659 11/25/20 1807 11/25/20 1906  GLUCAP 500* 475* 536* 502* 507*    Radiological Exams on Admission: DG Chest Port 1 View  Result Date: 11/25/2020 CLINICAL DATA:  Rapid heart rate. EXAM: PORTABLE CHEST 1 VIEW COMPARISON:  10/26/2020 FINDINGS: Previous median sternotomy and CABG procedure. Aortic atherosclerosis. Asymmetric elevation of right hemidiaphragm. No interstitial edema or airspace opacities. The bones appear diffusely osteopenic. Degenerative changes are noted at the left glenohumeral joint. IMPRESSION: No acute abnormality. Aortic Atherosclerosis (ICD10-I70.0). Electronically Signed    By: Kerby Moors M.D.   On: 11/25/2020 11:11   ECHOCARDIOGRAM COMPLETE  Result Date: 11/25/2020    ECHOCARDIOGRAM REPORT   Patient Name:   Sandy Owens Date of Exam: 11/25/2020 Medical Rec #:  [id number]  Height:       61.0 in Accession #:    [id number]        Weight:       121.3 lb Date of Birth:  November 16, 1923         BSA:          1.527 m Patient Age:    87 years          BP:           137/54 mmHg Patient Gender: F                 HR:           70 bpm. Exam Location:  Inpatient Procedure: 2D Echo, 3D Echo, Color Doppler and Cardiac Doppler Indications:     Tachycardia [[id number]]  History:         Patient has prior history of Echocardiogram examinations, most                  recent 06/07/2020. CAD, Prior CABG; Risk Factors:Hypertension,                  Diabetes and Dyslipidemia. Hypotjyroidism. Past history of                  breast cancer.  Sonographer:     Darlina Sicilian RDCS Referring Phys:  [id number] [facility] J PATWARDHAN Diagnosing Phys: Vernell Leep MD  Sonographer Comments: Global longitudinal strain was attempted. IMPRESSIONS  1. Left ventricular ejection fraction, by estimation, is 55 to 60%. The left ventricle has normal function. The left ventricle has no regional wall motion abnormalities. There is mild left ventricular hypertrophy. Left ventricular diastolic parameters are consistent with Grade I diastolic dysfunction (impaired relaxation).  2. Right ventricular systolic function is low normal. The right ventricular size is normal.  3. The mitral valve is grossly normal. Mild to moderate mitral valve regurgitation.  4. The aortic valve is tricuspid. Aortic valve regurgitation is not visualized. Moderate aortic valve stenosis. Aortic valve area, by VTI measures 1.31 cm. Aortic valve mean gradient measures 23.0 mmHg. Aortic valve Vmax measures 3.14 m/s.  5. There is borderline dilatation of the aortic root, measuring 37 mm.  6. The inferior vena cava is normal in size with greater than  50% respiratory variability, suggesting right atrial pressure of 3 mmHg.  7. No significant change compared to putpatient study on 06/07/2020. FINDINGS  Left Ventricle: Left ventricular ejection fraction, by estimation, is 55 to 60%. The left ventricle has normal function. The left ventricle has no regional wall motion abnormalities. The left ventricular internal cavity size was normal in size. There is  mild left ventricular hypertrophy. Left ventricular diastolic parameters are consistent with Grade I diastolic dysfunction (impaired relaxation). Right Ventricle: The right ventricular size is normal. No increase in right ventricular wall thickness. Right ventricular systolic function is low normal. Left Atrium: Left atrial size was normal in size. Right Atrium: Right atrial size was normal in size. Pericardium: There is no evidence of pericardial effusion. Mitral Valve: The mitral valve is grossly normal. Mild to moderate mitral valve regurgitation. MV peak gradient, 6.2 mmHg. The mean mitral valve gradient is 3.0 mmHg. Tricuspid Valve: The tricuspid valve is grossly normal. Tricuspid valve regurgitation is not demonstrated. Aortic Valve: The aortic valve is tricuspid. Aortic valve regurgitation is not visualized. Moderate aortic stenosis is present. Aortic valve mean gradient measures 23.0 mmHg. Aortic valve peak gradient measures 39.4 mmHg. Aortic valve area, by VTI measures  1.31 cm. Pulmonic Valve: The pulmonic valve was grossly normal. Pulmonic valve regurgitation is not visualized. Aorta: Aortic dilatation noted. There is borderline dilatation of the aortic root, measuring 37 mm. Venous: The inferior vena cava is normal in size with greater than 50% respiratory variability, suggesting right atrial pressure of 3 mmHg. IAS/Shunts: No atrial level shunt detected by color flow Doppler.  LEFT VENTRICLE PLAX 2D LVIDd:         5.00 cm  Diastology LVIDs:         2.90 cm  LV e' medial:    5.11 cm/s LV PW:         0.90  cm  LV E/e' medial:  19.0 LV IVS:        1.50 cm  LV e' lateral:   7.62 cm/s LVOT diam:     2.10 cm  LV E/e' lateral: 12.8 LV SV:         82 LV SV Index:   54 LVOT Area:     3.46 cm                          3D Volume EF:                         3D EF:        53 %                         LV EDV:       115 ml                         LV ESV:       54 ml                         LV SV:        61 ml RIGHT VENTRICLE TAPSE (M-mode): 1.1 cm LEFT ATRIUM             Index       RIGHT ATRIUM          Index LA diam:        3.70 cm 2.42 cm/m  RA Area:     8.39 cm LA Vol (A2C):   37.6 ml 24.63 ml/m RA Volume:   15.80 ml 10.35 ml/m LA Vol (A4C):   25.1 ml 16.44 ml/m LA Biplane Vol: 31.5 ml 20.63 ml/m  AORTIC VALVE AV Area (Vmax):    1.02 cm AV Area (Vmean):   1.14 cm AV Area (VTI):     1.31 cm AV Vmax:           314.00 cm/s AV Vmean:          224.000 cm/s AV VTI:            0.631 m AV Peak Grad:      39.4 mmHg AV Mean Grad:      23.0 mmHg LVOT Vmax:         92.30 cm/s LVOT Vmean:        74.000 cm/s LVOT VTI:          0.238 m LVOT/AV VTI ratio: 0.38  AORTA Ao Root diam: 3.00 cm Ao Asc diam:  2.90 cm MITRAL VALVE MV Area (PHT): 3.42 cm     SHUNTS MV Area VTI:   2.27 cm  Systemic VTI:  0.24 m MV Peak grad:  6.2 mmHg     Systemic Diam: 2.10 cm MV Mean grad:  3.0 mmHg MV Vmax:       1.25 m/s MV Vmean:      73.5 cm/s MV Decel Time: 222 msec MV E velocity: 97.30 cm/s MV A velocity: 130.00 cm/s MV E/A ratio:  0.75 Manish Patwardhan MD Electronically signed by Vernell Leep MD Signature Date/Time: 11/25/2020/4:01:22 PM    Final     EKG: Independently reviewed.   Time spent: Greater than 45 minutes was spent reviewing documentation, lab work and evaluating patient at bedside.  Orene Desanctis [facility]   If 7PM-7AM, please contact night-coverage [email or web address] Password [facility] 11/25/2020, 7:25 PM

## 2020-11-25 NOTE — Progress Notes (Signed)
ANTICOAGULATION CONSULT NOTE - Initial Consult  Pharmacy Consult for heparin Indication: chest pain/ACS  Allergies  Allergen Reactions  . Tape Other (See Comments)    TAPE TEARS AND BRUISES THE SKIN VERY EASILY- PATIENT HAS VERY THIN SKIN!!  . Lisinopril Hives  . Statins Other (See Comments)    Muscle pain    Patient Measurements: Height: 5\' 1"  (154.9 cm) Weight: 55 kg (121 lb 4.1 oz) IBW/kg (Calculated) : 47.8  Vital Signs: Temp: 97.9 F (36.6 C) (03/26 1300) Temp Source: Oral (03/26 1300) BP: 137/54 (03/26 1300) Pulse Rate: 72 (03/26 1230)  Labs: Recent Labs    11/25/20 1055 11/25/20 1250  HGB  --  10.5*  HCT  --  32.8*  PLT  --  199  LABPROT 13.2  --   INR 1.0  --   CREATININE 1.22*  --   TROPONINIHS 251* 1,236*    Estimated Creatinine Clearance: 20.8 mL/min (A) (by C-G formula based on SCr of 1.22 mg/dL (H)).   Medical History: Past Medical History:  Diagnosis Date  . Breast cancer ([place])    left  . Cancer [facility])    left breast  . Coronary artery disease    CABG - 3- 2001  . Diabetes mellitus without complication ([place])   . High cholesterol   . Hypertension   . Hypothyroidism   . Neuromuscular disorder (HCC)    neuropathy in feet  and legs    Medications:  Medications Prior to Admission  Medication Sig Dispense Refill Last Dose  . Artificial Tear Ointment (ARTIFICIAL TEARS) ointment Place 1 application into both eyes as needed (dry eyes).     Marland Kitchen aspirin 81 MG tablet Take 81 mg by mouth daily with supper.   11/24/2020 at 1800  . Calcium Carb-Cholecalciferol (CALCIUM+D3 PO) Take 1 tablet by mouth daily with breakfast.   11/25/2020 at am  . Cholecalciferol (VITAMIN D3) 50 MCG (2000 UT) capsule Take 1 capsule (2,000 Units total) by mouth daily. 100 capsule 3 11/25/2020 at am  . CINNAMON PO Take 2,000 mg by mouth daily.   11/25/2020 at am  . Dextran 70-Hypromellose (ARTIFICIAL TEARS PF OP) Place 1 drop into both eyes 3 (three) times daily as needed (for  dryness).   Past Week at Unknown time  . Multiple Vitamins-Minerals (PRESERVISION AREDS 2 PO) Take 1 capsule by mouth in the morning and at bedtime.   11/25/2020 at am  . NON FORMULARY Take 1 capsule by mouth See admin instructions. [facility] and nerve support capsules- Take 2 capsules by mouth once a day   11/25/2020 at Unknown time  . Omega-3 Fatty Acids (FISH OIL PO) Take 1,400 mg by mouth 2 (two) times daily.    11/25/2020 at am  . amLODipine (NORVASC) 5 MG tablet TAKE 1 TABLET(5 MG) BY MOUTH DAILY 90 tablet 3   . donepezil (ARICEPT) 5 MG tablet Take 1 tablet (5 mg total) by mouth at bedtime. 90 tablet 3   . ivabradine (CORLANOR) 5 MG TABS tablet Take 1 tablet (5 mg total) by mouth 2 (two) times daily with a meal. 60 tablet 3   . levothyroxine (SYNTHROID) 88 MCG tablet TAKE 1 TABLET(88 MCG) BY MOUTH DAILY 90 tablet 3   . predniSONE (DELTASONE) 5 MG tablet Take 3 tablets (15 mg total) by mouth daily with breakfast. 90 tablet 5    Scheduled:  . amiodarone  200 mg Oral BID  . amLODipine  5 mg Oral Daily  . aspirin EC  81 mg Oral Q supper  . donepezil  5 mg Oral QHS  . heparin  5,000 Units Subcutaneous Q8H  . [START ON 11/26/2020] levothyroxine  88 mcg Oral Q0600  . [START ON 11/26/2020] predniSONE  15 mg Oral Q breakfast    Assessment: 87 yo female with SVT and troponin elevation w/ hx of CAD and CABG. Pharmacy consulted to dose heparin No anticoagulants noted PTA -hg= 10.5   Goal of Therapy:  Heparin level 0.3-0.7 units/ml Monitor platelets by anticoagulation protocol: Yes   Plan:  -heparin bolus 3000 units x1 followed by 650 units/hr -Heparin level in 8 hours and daily wth CBC daily  Hildred Laser, PharmD Clinical Pharmacist **Pharmacist phone directory can now be found on [email or web address] ([email or web address]).  Listed under [place].

## 2020-11-25 NOTE — Progress Notes (Signed)
Patient admitted to 6E-24 from ED accompanied by daughter.  Currently is in no acute distress.  Patient and daughter introduced to staff.  Pt states to this nurse that she "has a terrible memory" and daughter states she has hx of confusion.  Bed in low position and call light placed within reach.

## 2020-11-25 NOTE — Progress Notes (Signed)
Uncontrolled, likely medication induced diabetes, while on prednisone 15 mg. I will get medicine consult to help manage hyperglycemia around 500. Appreciate their input.    Nigel Mormon, MD Pager: [phone] Office: [phone]

## 2020-11-25 NOTE — Progress Notes (Signed)
Lab called with critical blood glucose 513. MD aware and orders in place. See oreders.

## 2020-11-25 NOTE — Progress Notes (Signed)
[place] for heparin Indication: chest pain/ACS  Allergies  Allergen Reactions  . Tape Other (See Comments)    TAPE TEARS AND BRUISES THE SKIN VERY EASILY- PATIENT HAS VERY THIN SKIN!!  . Lisinopril Hives  . Statins Other (See Comments)    Muscle pain    Patient Measurements: Height: 5\' 1"  (154.9 cm) Weight: 55 kg (121 lb 4.1 oz) IBW/kg (Calculated) : 47.8  Vital Signs: Temp: 97.8 F (36.6 C) (03/26 1956) Temp Source: Oral (03/26 1956) BP: 146/62 (03/26 1956) Pulse Rate: 87 (03/26 1956)  Labs: Recent Labs    11/25/20 1055 11/25/20 1250 11/25/20 1913 11/25/20 2159  HGB  --  10.5*  --   --   HCT  --  32.8*  --   --   PLT  --  199  --   --   LABPROT 13.2  --   --   --   INR 1.0  --   --   --   HEPARINUNFRC  --   --   --  0.41  CREATININE 1.22*  --  1.18* 1.05*  TROPONINIHS 251* 1,236*  --   --     Estimated Creatinine Clearance: 24.2 mL/min (A) (by C-G formula based on SCr of 1.05 mg/dL (H)).  Assessment: 87 y.o. female with tachycardia and possible ACS for heparin  Goal of Therapy:  Heparin level 0.3-0.7 units/ml Monitor platelets by anticoagulation protocol: Yes   Plan:  Continue Heparin at current rate  Phillis Knack, PharmD, BCPS

## 2020-11-25 NOTE — Progress Notes (Signed)
Paged Dr. Virgina Jock related pts heart 150. Pt asymptomatic. Blood pressure 118/72. Dr. Robb Matar returned call with verbal orders for Metoprolol 2.5 IV once time dose, call rapid response and make aware possible adenosine IV administration if heart rate does not come down with Metoprolol. See new orders.

## 2020-11-25 NOTE — ED Triage Notes (Signed)
Pt bib ems from home with reports of tachycardia, unknown onset. Pt with HR 160's with ems with ST depression. Pt with intermittent chest pain along with dizziness. EMS unable to establish IV access.  118/64 HR 150 CBG 563

## 2020-11-26 ENCOUNTER — Inpatient Hospital Stay (HOSPITAL_COMMUNITY): Payer: Medicare HMO

## 2020-11-26 ENCOUNTER — Encounter (HOSPITAL_COMMUNITY): Payer: Self-pay | Admitting: Cardiology

## 2020-11-26 ENCOUNTER — Other Ambulatory Visit: Payer: Self-pay

## 2020-11-26 DIAGNOSIS — I4510 Unspecified right bundle-branch block: Secondary | ICD-10-CM | POA: Diagnosis present

## 2020-11-26 DIAGNOSIS — E785 Hyperlipidemia, unspecified: Secondary | ICD-10-CM | POA: Diagnosis present

## 2020-11-26 DIAGNOSIS — N390 Urinary tract infection, site not specified: Secondary | ICD-10-CM | POA: Diagnosis present

## 2020-11-26 DIAGNOSIS — I350 Nonrheumatic aortic (valve) stenosis: Secondary | ICD-10-CM | POA: Diagnosis not present

## 2020-11-26 DIAGNOSIS — Z9012 Acquired absence of left breast and nipple: Secondary | ICD-10-CM | POA: Diagnosis not present

## 2020-11-26 DIAGNOSIS — I471 Supraventricular tachycardia: Secondary | ICD-10-CM | POA: Diagnosis not present

## 2020-11-26 DIAGNOSIS — Z87891 Personal history of nicotine dependence: Secondary | ICD-10-CM | POA: Diagnosis not present

## 2020-11-26 DIAGNOSIS — R739 Hyperglycemia, unspecified: Secondary | ICD-10-CM | POA: Diagnosis not present

## 2020-11-26 DIAGNOSIS — Z9109 Other allergy status, other than to drugs and biological substances: Secondary | ICD-10-CM | POA: Diagnosis not present

## 2020-11-26 DIAGNOSIS — E1165 Type 2 diabetes mellitus with hyperglycemia: Secondary | ICD-10-CM | POA: Diagnosis present

## 2020-11-26 DIAGNOSIS — E1142 Type 2 diabetes mellitus with diabetic polyneuropathy: Secondary | ICD-10-CM | POA: Diagnosis present

## 2020-11-26 DIAGNOSIS — Z20822 Contact with and (suspected) exposure to covid-19: Secondary | ICD-10-CM | POA: Diagnosis present

## 2020-11-26 DIAGNOSIS — I2510 Atherosclerotic heart disease of native coronary artery without angina pectoris: Secondary | ICD-10-CM | POA: Diagnosis present

## 2020-11-26 DIAGNOSIS — I445 Left posterior fascicular block: Secondary | ICD-10-CM | POA: Diagnosis present

## 2020-11-26 DIAGNOSIS — Z853 Personal history of malignant neoplasm of breast: Secondary | ICD-10-CM | POA: Diagnosis not present

## 2020-11-26 DIAGNOSIS — Z888 Allergy status to other drugs, medicaments and biological substances status: Secondary | ICD-10-CM | POA: Diagnosis not present

## 2020-11-26 DIAGNOSIS — I480 Paroxysmal atrial fibrillation: Secondary | ICD-10-CM | POA: Diagnosis present

## 2020-11-26 DIAGNOSIS — I248 Other forms of acute ischemic heart disease: Secondary | ICD-10-CM | POA: Diagnosis present

## 2020-11-26 DIAGNOSIS — I10 Essential (primary) hypertension: Secondary | ICD-10-CM | POA: Diagnosis present

## 2020-11-26 DIAGNOSIS — R002 Palpitations: Secondary | ICD-10-CM | POA: Diagnosis present

## 2020-11-26 DIAGNOSIS — Z806 Family history of leukemia: Secondary | ICD-10-CM | POA: Diagnosis not present

## 2020-11-26 DIAGNOSIS — E039 Hypothyroidism, unspecified: Secondary | ICD-10-CM | POA: Diagnosis present

## 2020-11-26 DIAGNOSIS — T380X5A Adverse effect of glucocorticoids and synthetic analogues, initial encounter: Secondary | ICD-10-CM | POA: Diagnosis present

## 2020-11-26 DIAGNOSIS — F0390 Unspecified dementia without behavioral disturbance: Secondary | ICD-10-CM | POA: Diagnosis present

## 2020-11-26 DIAGNOSIS — Z951 Presence of aortocoronary bypass graft: Secondary | ICD-10-CM | POA: Diagnosis not present

## 2020-11-26 LAB — BASIC METABOLIC PANEL
Anion gap: 6 (ref 5–15)
Anion gap: 6 (ref 5–15)
BUN: 19 mg/dL (ref 8–23)
BUN: 17 mg/dL (ref 8–23)
CO2: 27 mmol/L (ref 22–32)
CO2: 26 mmol/L (ref 22–32)
Calcium: 8.4 mg/dL — ABNORMAL LOW (ref 8.9–10.3)
Calcium: 8.1 mg/dL — ABNORMAL LOW (ref 8.9–10.3)
Chloride: 103 mmol/L (ref 98–111)
Chloride: 103 mmol/L (ref 98–111)
Creatinine, Ser: 0.86 mg/dL (ref 0.44–1.00)
Creatinine, Ser: 0.93 mg/dL (ref 0.44–1.00)
GFR, Estimated: >60 mL/min (ref >60)
GFR, Estimated: 57 mL/min — ABNORMAL LOW (ref >60)
Glucose, Bld: 129 mg/dL — ABNORMAL HIGH (ref 70–99)
Glucose, Bld: 160 mg/dL — ABNORMAL HIGH (ref 70–99)
Potassium: 3.5 mmol/L (ref 3.5–5.1)
Potassium: 3.5 mmol/L (ref 3.5–5.1)
Sodium: 136 mmol/L (ref 135–145)
Sodium: 135 mmol/L (ref 135–145)

## 2020-11-26 LAB — GLUCOSE, CAPILLARY
Glucose-Capillary: 115 mg/dL — ABNORMAL HIGH (ref 70–99)
Glucose-Capillary: 121 mg/dL — ABNORMAL HIGH (ref 70–99)
Glucose-Capillary: 124 mg/dL — ABNORMAL HIGH (ref 70–99)
Glucose-Capillary: 126 mg/dL — ABNORMAL HIGH (ref 70–99)
Glucose-Capillary: 130 mg/dL — ABNORMAL HIGH (ref 70–99)
Glucose-Capillary: 149 mg/dL — ABNORMAL HIGH (ref 70–99)
Glucose-Capillary: 159 mg/dL — ABNORMAL HIGH (ref 70–99)
Glucose-Capillary: 162 mg/dL — ABNORMAL HIGH (ref 70–99)
Glucose-Capillary: 169 mg/dL — ABNORMAL HIGH (ref 70–99)
Glucose-Capillary: 181 mg/dL — ABNORMAL HIGH (ref 70–99)
Glucose-Capillary: 183 mg/dL — ABNORMAL HIGH (ref 70–99)
Glucose-Capillary: 204 mg/dL — ABNORMAL HIGH (ref 70–99)
Glucose-Capillary: 205 mg/dL — ABNORMAL HIGH (ref 70–99)
Glucose-Capillary: 320 mg/dL — ABNORMAL HIGH (ref 70–99)
Glucose-Capillary: 386 mg/dL — ABNORMAL HIGH (ref 70–99)

## 2020-11-26 LAB — CBC
HCT: 33.5 % — ABNORMAL LOW (ref 36.0–46.0)
Hemoglobin: 10.5 g/dL — ABNORMAL LOW (ref 12.0–15.0)
MCH: 26.4 pg (ref 26.0–34.0)
MCHC: 31.3 g/dL (ref 30.0–36.0)
MCV: 84.4 fL (ref 80.0–100.0)
Platelets: 195 10*3/uL (ref 150–400)
RBC: 3.97 MIL/uL (ref 3.87–5.11)
RDW: 18.5 % — ABNORMAL HIGH (ref 11.5–15.5)
WBC: 9.7 10*3/uL (ref 4.0–10.5)
nRBC: 0 % (ref 0.0–0.2)

## 2020-11-26 LAB — HEPARIN LEVEL (UNFRACTIONATED): Heparin Unfractionated: 0.32 IU/mL (ref 0.30–0.70)

## 2020-11-26 MED ORDER — INSULIN ASPART 100 UNIT/ML ~~LOC~~ SOLN
0.0000 [IU] | SUBCUTANEOUS | Status: DC
  Administered 2020-11-26: 7 [IU] via SUBCUTANEOUS
  Administered 2020-11-26: 9 [IU] via SUBCUTANEOUS
  Administered 2020-11-26 – 2020-11-27 (×2): 1 [IU] via SUBCUTANEOUS
  Administered 2020-11-27: 7 [IU] via SUBCUTANEOUS
  Administered 2020-11-27: 3 [IU] via SUBCUTANEOUS
  Administered 2020-11-27: 2 [IU] via SUBCUTANEOUS
  Administered 2020-11-27 – 2020-11-28 (×2): 3 [IU] via SUBCUTANEOUS
  Administered 2020-11-28: 5 [IU] via SUBCUTANEOUS

## 2020-11-26 MED ORDER — METOPROLOL TARTRATE 5 MG/5ML IV SOLN
INTRAVENOUS | Status: AC
  Filled 2020-11-26: qty 5

## 2020-11-26 MED ORDER — AMIODARONE HCL 200 MG PO TABS
400.0000 mg | ORAL_TABLET | Freq: Two times a day (BID) | ORAL | Status: DC
  Administered 2020-11-26: 400 mg via ORAL
  Filled 2020-11-26: qty 2

## 2020-11-26 MED ORDER — ADENOSINE 6 MG/2ML IV SOLN
INTRAVENOUS | Status: AC
  Filled 2020-11-26: qty 2

## 2020-11-26 MED ORDER — METOPROLOL TARTRATE 5 MG/5ML IV SOLN
2.5000 mg | Freq: Once | INTRAVENOUS | Status: AC
  Administered 2020-11-26: 2.5 mg via INTRAVENOUS

## 2020-11-26 MED ORDER — INSULIN DETEMIR 100 UNIT/ML ~~LOC~~ SOLN
8.0000 [IU] | Freq: Every day | SUBCUTANEOUS | Status: DC
  Administered 2020-11-26: 8 [IU] via SUBCUTANEOUS
  Filled 2020-11-26 (×2): qty 0.08

## 2020-11-26 MED ORDER — ADENOSINE 6 MG/2ML IV SOLN
6.0000 mg | Freq: Once | INTRAVENOUS | Status: AC
  Administered 2020-11-26: 6 mg via INTRAVENOUS

## 2020-11-26 MED ORDER — AMIODARONE HCL IN DEXTROSE 360-4.14 MG/200ML-% IV SOLN
30.0000 mg/h | INTRAVENOUS | Status: DC
  Administered 2020-11-27: 30 mg/h via INTRAVENOUS
  Filled 2020-11-26: qty 200

## 2020-11-26 MED ORDER — AMIODARONE HCL IN DEXTROSE 360-4.14 MG/200ML-% IV SOLN
60.0000 mg/h | INTRAVENOUS | Status: AC
  Administered 2020-11-26: 60 mg/h via INTRAVENOUS
  Filled 2020-11-26: qty 200

## 2020-11-26 NOTE — Plan of Care (Signed)

## 2020-11-26 NOTE — Progress Notes (Signed)
PROGRESS NOTE    [facility]  XVQ:[id number] DOB: [birth year] DOA: 11/25/2020 PCP: [name], MD   Chief Complaint  Patient presents with  . Code STEMI  Brief Narrative: 87 year old female with CAD, CABG history, HTN, T2DM with peripheral neuropathy, hypothyroidism, history of subarachnoid hemorrhage, CKD stage III, history of breast cancer, HLD, chronic back pain, cognitive impairment admitted with atrial tachycardia.  In the ED patient received education and converted to sinus rhythm, was admitted under cardiology service and found to have uncontrolled hyperglycemia and TRH consulted for blood sugar management.  Subjective: Seen and examined this morning. Patient resting comfortably, daughter at the bedside.  Denies chest pain nausea vomiting.   Assessment & Plan:  Uncontrolled hyperglycemia with new onset diabetes with recent prednisone use for few weeks.  Hemoglobin A1c at 9.8.Overnight needed insulin drip, blood sugar is stabilized and has been on dextrose overnight.  We will stop insulin, add Lantus 8 units, sliding scale insulin.  Hopefully weaning off prednisone will help her sugar too moving forward, will defer this to her primary care physician who has put her on prednisone. Recent Labs  Lab 11/26/20 0611 11/26/20 0659 11/26/20 0751 11/26/20 0827 11/26/20 1042  GLUCAP 162* 159* 183* 169* 130*   Tachycardia/SVT: Per cardiology continue with current amiodarone, echo is pending and monitor on telemetry.  Hypertension Hyperlipidemia CAD status post CABG Elevated troponin Blood pressure is controlled. Continue her home aspirin Plavix amlodipine, Crestor added.  eta-blocker on hold given amiodarone use. Elevated troponin likely demand ischemia as per cardiology in the setting of tachycardia, ACS could not be excluded patient is empirically on IV heparin.  Defer to cardiology managed properly  Moderate aortic stenosis: Echo pending Chronic back pain on  prednisone 15 mg for few weeks, will need to taper slowly.  Continue current dose-and follow-up with PCP. Dementia: Daughter at the bedside, continue fall precaution delirium precaution continue home donepezil  Diet Order            Diet NPO time specified Except for: Sips with Meds  Diet effective now               Patient's Body mass index is 24.37 kg/m. DVT prophylaxis: SCDs Start: 11/25/20 1212 Code Status:   Code Status: Full Code  Family Communication: plan of care discussed with patient at bedside.  Status is: Inpatient Remains inpatient appropriate because:Inpatient level of care appropriate due to severity of illness    Unresulted Labs (From admission, onward)          Start     Ordered   11/26/20 0500  Heparin level (unfractionated)  Daily,   R      11/25/20 1442   11/26/20 0500  CBC  Daily,   R      11/25/20 1442   11/25/20 1101  CBC with Differential/Platelet  Once,   STAT        11/25/20 1101         Medications reviewed:  Scheduled Meds: . amiodarone  200 mg Oral BID  . amLODipine  5 mg Oral Daily  . aspirin EC  81 mg Oral Q supper  . clopidogrel  75 mg Oral Daily  . donepezil  5 mg Oral QHS  . insulin aspart  0-9 Units Subcutaneous Q4H  . insulin detemir  8 Units Subcutaneous Daily  . levothyroxine  88 mcg Oral Q0600  . predniSONE  15 mg Oral Q breakfast  . rosuvastatin  10 mg Oral Daily  Continuous Infusions: . sodium chloride 75 mL/hr at 11/25/20 2125  . dextrose 5% lactated ringers 125 mL/hr at 11/26/20 0015  . heparin 650 Units/hr (11/25/20 1523)   Consultants:see note  Procedures:see note  Antimicrobials: Anti-infectives (From admission, onward)   None     Culture/Microbiology    Component Value Date/Time   SDES URINE, CATHETERIZED 11/28/2017 1430   SPECREQUEST  11/28/2017 1430    Normal Performed at [facility] [address], [place], [place] [address]    CULT >=100,000 COLONIES/mL ESCHERICHIA COLI (A) 11/28/2017 1430    REPTSTATUS 11/30/2017 FINAL 11/28/2017 1430    Other culture-see note  Objective: Vitals: Today's Vitals   11/25/20 1956 11/25/20 2308 11/26/20 0024 11/26/20 0400  BP: (!) 146/62 118/72 106/69 123/75  Pulse: 87  76 (!) 110  Resp: 20 20 20 20   Temp: 97.8 F (36.6 C) 97.7 F (36.5 C) 97.7 F (36.5 C) 97.7 F (36.5 C)  TempSrc: Oral Oral Oral Oral  SpO2: 96%  96% 94%  Weight:    58.5 kg  Height:      PainSc: 0-No pain       Intake/Output Summary (Last 24 hours) at 11/26/2020 1055 Last data filed at 11/26/2020 1950 Gross per 24 hour  Intake 3046.99 ml  Output 3600 ml  Net -553.01 ml   Filed Weights   11/25/20 1050 11/25/20 1300 11/26/20 0400  Weight: 55 kg 55 kg 58.5 kg   Weight change:   Intake/Output from previous day: 03/26 0701 - 03/27 0700 In: 3047 [P.O.:620; I.V.:2427] Out: 3600 [Urine:3600] Intake/Output this shift: No intake/output data recorded. Filed Weights   11/25/20 1050 11/25/20 1300 11/26/20 0400  Weight: 55 kg 55 kg 58.5 kg    Examination: General exam: AA elderly with baseline dementia not in distress HEENT:Oral mucosa moist, Ear/Nose WNL grossly,dentition normal. Respiratory system: bilaterally diminished,no use of accessory muscle, non tender. Cardiovascular system: S1 & S2 +, regular, No JVD. Gastrointestinal system: Abdomen soft, NT,ND, BS+. Nervous System:Alert, awake, moving extremities and grossly nonfocal Extremities: No edema, distal peripheral pulses palpable.  Skin: No rashes,no icterus. MSK: Normal muscle bulk,tone, power  Data Reviewed: I have personally reviewed following labs and imaging studies CBC: Recent Labs  Lab 11/25/20 1250 11/26/20 0330  WBC 9.3 9.7  NEUTROABS 7.1  --   HGB 10.5* 10.5*  HCT 32.8* 33.5*  MCV 84.8 84.4  PLT 199 932   Basic Metabolic Panel: Recent Labs  Lab 11/25/20 1055 11/25/20 1913 11/25/20 2159 11/26/20 0330 11/26/20 0736  NA 128* 127* 129* 136 135  K 5.2* 4.8 4.2 3.5 3.5  CL 91* 94*  95* 103 103  CO2 25 20* 24 27 26   GLUCOSE 498* 513* 391* 129* 160*  BUN 25* 23 22 19 17   CREATININE 1.22* 1.18* 1.05* 0.86 0.93  CALCIUM 8.7* 8.4* 8.5* 8.4* 8.1*   GFR: Estimated Creatinine Clearance: 29.8 mL/min (by C-G formula based on SCr of 0.93 mg/dL). Liver Function Tests: Recent Labs  Lab 11/25/20 1055  AST 59*  ALT 51*  ALKPHOS 126  BILITOT 0.8  PROT 5.9*  ALBUMIN 2.4*   No results for input(s): LIPASE, AMYLASE in the last 168 hours. No results for input(s): AMMONIA in the last 168 hours. Coagulation Profile: Recent Labs  Lab 11/25/20 1055  INR 1.0   Cardiac Enzymes: No results for input(s): CKTOTAL, CKMB, CKMBINDEX, TROPONINI in the last 168 hours. BNP (last 3 results) No results for input(s): PROBNP in the last 8760 hours. HbA1C: Recent  Labs    11/25/20 1250  HGBA1C 9.8*   CBG: Recent Labs  Lab 11/26/20 0611 11/26/20 0659 11/26/20 0751 11/26/20 0827 11/26/20 1042  GLUCAP 162* 159* 183* 169* 130*   Lipid Profile: No results for input(s): CHOL, HDL, LDLCALC, TRIG, CHOLHDL, LDLDIRECT in the last 72 hours. Thyroid Function Tests: Recent Labs    11/25/20 1218  TSH 6.807*   Anemia Panel: No results for input(s): VITAMINB12, FOLATE, FERRITIN, TIBC, IRON, RETICCTPCT in the last 72 hours. Sepsis Labs: No results for input(s): PROCALCITON, LATICACIDVEN in the last 168 hours.  Recent Results (from the past 240 hour(s))  Resp Panel by RT-PCR (Flu A&B, Covid) Nasopharyngeal Swab     Status: None   Collection Time: 11/25/20 11:02 AM   Specimen: Nasopharyngeal Swab; Nasopharyngeal(NP) swabs in vial transport medium  Result Value Ref Range Status   SARS Coronavirus 2 by RT PCR NEGATIVE NEGATIVE Final    Comment: (NOTE) SARS-CoV-2 target nucleic acids are NOT DETECTED.  The SARS-CoV-2 RNA is generally detectable in upper respiratory specimens during the acute phase of infection. The lowest concentration of SARS-CoV-2 viral copies this assay can detect  is 138 copies/mL. A negative result does not preclude SARS-Cov-2 infection and should not be used as the sole basis for treatment or other patient management decisions. A negative result may occur with  improper specimen collection/handling, submission of specimen other than nasopharyngeal swab, presence of viral mutation(s) within the areas targeted by this assay, and inadequate number of viral copies(<138 copies/mL). A negative result must be combined with clinical observations, patient history, and epidemiological information. The expected result is Negative.  Fact Sheet for Patients:  EntrepreneurPulse.com.au  Fact Sheet for Healthcare Providers:  IncredibleEmployment.be  This test is no t yet approved or cleared by the [place] FDA and  has been authorized for detection and/or diagnosis of SARS-CoV-2 by FDA under an Emergency Use Authorization (EUA). This EUA will remain  in effect (meaning this test can be used) for the duration of the COVID-19 declaration under Section 564(b)(1) of the Act, 21 U.S.C.section 360bbb-3(b)(1), unless the authorization is terminated  or revoked sooner.       Influenza A by PCR NEGATIVE NEGATIVE Final   Influenza B by PCR NEGATIVE NEGATIVE Final    Comment: (NOTE) The Xpert Xpress SARS-CoV-2/FLU/RSV plus assay is intended as an aid in the diagnosis of influenza from Nasopharyngeal swab specimens and should not be used as a sole basis for treatment. Nasal washings and aspirates are unacceptable for Xpert Xpress SARS-CoV-2/FLU/RSV testing.  Fact Sheet for Patients: EntrepreneurPulse.com.au  Fact Sheet for Healthcare Providers: IncredibleEmployment.be  This test is not yet approved or cleared by the [place] FDA and has been authorized for detection and/or diagnosis of SARS-CoV-2 by FDA under an Emergency Use Authorization (EUA). This EUA will remain in effect  (meaning this test can be used) for the duration of the COVID-19 declaration under Section 564(b)(1) of the Act, 21 U.S.C. section 360bbb-3(b)(1), unless the authorization is terminated or revoked.  Performed at [facility], [place] [address] [place], [place] [address]      Radiology Studies: DG Chest Port 1 View  Result Date: 11/25/2020 CLINICAL DATA:  Rapid heart rate. EXAM: PORTABLE CHEST 1 VIEW COMPARISON:  10/26/2020 FINDINGS: Previous median sternotomy and CABG procedure. Aortic atherosclerosis. Asymmetric elevation of right hemidiaphragm. No interstitial edema or airspace opacities. The bones appear diffusely osteopenic. Degenerative changes are noted at the left glenohumeral joint. IMPRESSION: No acute abnormality. Aortic Atherosclerosis (  ICD10-I70.0). Electronically Signed   By: Kerby Moors M.D.   On: 11/25/2020 11:11   ECHOCARDIOGRAM COMPLETE  Result Date: 11/25/2020    ECHOCARDIOGRAM REPORT   Patient Name:   PETRONA WYETH Date of Exam: 11/25/2020 Medical Rec #:  [id number]         Height:       61.0 in Accession #:    [id number]        Weight:       121.3 lb Date of Birth:  [birth year]         BSA:          1.527 m Patient Age:    89 years          BP:           137/54 mmHg Patient Gender: F                 HR:           70 bpm. Exam Location:  Inpatient Procedure: 2D Echo, 3D Echo, Color Doppler and Cardiac Doppler Indications:     Tachycardia [[id number]]  History:         Patient has prior history of Echocardiogram examinations, most                  recent 06/07/2020. CAD, Prior CABG; Risk Factors:Hypertension,                  Diabetes and Dyslipidemia. Hypotjyroidism. Past history of                  breast cancer.  Sonographer:     Darlina Sicilian RDCS Referring Phys:  [id number] [facility] J PATWARDHAN Diagnosing Phys: Vernell Leep MD  Sonographer Comments: Global longitudinal strain was attempted. IMPRESSIONS  1. Left ventricular ejection fraction, by estimation, is 55 to 60%.  The left ventricle has normal function. The left ventricle has no regional wall motion abnormalities. There is mild left ventricular hypertrophy. Left ventricular diastolic parameters are consistent with Grade I diastolic dysfunction (impaired relaxation).  2. Right ventricular systolic function is low normal. The right ventricular size is normal.  3. The mitral valve is grossly normal. Mild to moderate mitral valve regurgitation.  4. The aortic valve is tricuspid. Aortic valve regurgitation is not visualized. Moderate aortic valve stenosis. Aortic valve area, by VTI measures 1.31 cm. Aortic valve mean gradient measures 23.0 mmHg. Aortic valve Vmax measures 3.14 m/s.  5. There is borderline dilatation of the aortic root, measuring 37 mm.  6. The inferior vena cava is normal in size with greater than 50% respiratory variability, suggesting right atrial pressure of 3 mmHg.  7. No significant change compared to putpatient study on 06/07/2020. FINDINGS  Left Ventricle: Left ventricular ejection fraction, by estimation, is 55 to 60%. The left ventricle has normal function. The left ventricle has no regional wall motion abnormalities. The left ventricular internal cavity size was normal in size. There is  mild left ventricular hypertrophy. Left ventricular diastolic parameters are consistent with Grade I diastolic dysfunction (impaired relaxation). Right Ventricle: The right ventricular size is normal. No increase in right ventricular wall thickness. Right ventricular systolic function is low normal. Left Atrium: Left atrial size was normal in size. Right Atrium: Right atrial size was normal in size. Pericardium: There is no evidence of pericardial effusion. Mitral Valve: The mitral valve is grossly normal. Mild to moderate mitral valve regurgitation. MV peak gradient, 6.2 mmHg. The mean mitral valve gradient is  3.0 mmHg. Tricuspid Valve: The tricuspid valve is grossly normal. Tricuspid valve regurgitation is not  demonstrated. Aortic Valve: The aortic valve is tricuspid. Aortic valve regurgitation is not visualized. Moderate aortic stenosis is present. Aortic valve mean gradient measures 23.0 mmHg. Aortic valve peak gradient measures 39.4 mmHg. Aortic valve area, by VTI measures 1.31 cm. Pulmonic Valve: The pulmonic valve was grossly normal. Pulmonic valve regurgitation is not visualized. Aorta: Aortic dilatation noted. There is borderline dilatation of the aortic root, measuring 37 mm. Venous: The inferior vena cava is normal in size with greater than 50% respiratory variability, suggesting right atrial pressure of 3 mmHg. IAS/Shunts: No atrial level shunt detected by color flow Doppler.  LEFT VENTRICLE PLAX 2D LVIDd:         5.00 cm  Diastology LVIDs:         2.90 cm  LV e' medial:    5.11 cm/s LV PW:         0.90 cm  LV E/e' medial:  19.0 LV IVS:        1.50 cm  LV e' lateral:   7.62 cm/s LVOT diam:     2.10 cm  LV E/e' lateral: 12.8 LV SV:         82 LV SV Index:   54 LVOT Area:     3.46 cm                          3D Volume EF:                         3D EF:        53 %                         LV EDV:       115 ml                         LV ESV:       54 ml                         LV SV:        61 ml RIGHT VENTRICLE TAPSE (M-mode): 1.1 cm LEFT ATRIUM             Index       RIGHT ATRIUM          Index LA diam:        3.70 cm 2.42 cm/m  RA Area:     8.39 cm LA Vol (A2C):   37.6 ml 24.63 ml/m RA Volume:   15.80 ml 10.35 ml/m LA Vol (A4C):   25.1 ml 16.44 ml/m LA Biplane Vol: 31.5 ml 20.63 ml/m  AORTIC VALVE AV Area (Vmax):    1.02 cm AV Area (Vmean):   1.14 cm AV Area (VTI):     1.31 cm AV Vmax:           314.00 cm/s AV Vmean:          224.000 cm/s AV VTI:            0.631 m AV Peak Grad:      39.4 mmHg AV Mean Grad:      23.0 mmHg LVOT Vmax:         92.30 cm/s LVOT Vmean:  74.000 cm/s LVOT VTI:          0.238 m LVOT/AV VTI ratio: 0.38  AORTA Ao Root diam: 3.00 cm Ao Asc diam:  2.90 cm MITRAL VALVE MV  Area (PHT): 3.42 cm     SHUNTS MV Area VTI:   2.27 cm     Systemic VTI:  0.24 m MV Peak grad:  6.2 mmHg     Systemic Diam: 2.10 cm MV Mean grad:  3.0 mmHg MV Vmax:       1.25 m/s MV Vmean:      73.5 cm/s MV Decel Time: 222 msec MV E velocity: 97.30 cm/s MV A velocity: 130.00 cm/s MV E/A ratio:  0.75 Manish Patwardhan MD Electronically signed by Vernell Leep MD Signature Date/Time: 11/25/2020/4:01:22 PM    Final      LOS: 0 days   Antonieta Pert, MD [facility]  11/26/2020, 10:55 AM

## 2020-11-26 NOTE — Progress Notes (Addendum)
Recurrence of atrial tachycardia, not relieved with IV metoprolol 2.5 mg.  Called rapid response team. Supervised IV adenosine 6 mg push. Converts to sinus rhythm at 3:24 PM. Tolerated well. No complications.  Added IV amiodarone. Will check CMP tomorrow.   CRITICAL CARE Performed by: Vernell Leep   Total critical care time: 30 minutes   Critical care time was exclusive of separately billable procedures and treating other patients.   Critical care was necessary to treat or prevent imminent or life-threatening deterioration.   Critical care was time spent personally by me on the following activities: development of treatment plan with patient and/or surrogate as well as nursing, discussions with consultants, evaluation of patient's response to treatment, examination of patient, obtaining history from patient or surrogate, ordering and performing treatments and interventions, ordering and review of laboratory studies, ordering and review of radiographic studies, pulse oximetry and re-evaluation of patient's condition.

## 2020-11-26 NOTE — Progress Notes (Signed)
Subjective:  Had one more episode of atrial tachycardia overnight.  IV adenosine was not given Eventually converted after 4 hours.   Objective:  Vital Signs in the last 24 hours: Temp:  [97.7 F (36.5 C)-97.8 F (36.6 C)] 97.7 F (36.5 C) (03/27 0400) Pulse Rate:  [76-110] 110 (03/27 0400) Resp:  [20] 20 (03/27 0400) BP: (106-146)/(62-75) 123/75 (03/27 0400) SpO2:  [94 %-96 %] 94 % (03/27 0400) Weight:  [58.5 kg] 58.5 kg (03/27 0400)  Intake/Output from previous day: 03/26 0701 - 03/27 0700 In: 3047 [P.O.:620; I.V.:2427] Out: 3600 [Urine:3600]  Physical Exam Vitals and nursing note reviewed.  Constitutional:      General: She is not in acute distress.    Appearance: She is well-developed.  HENT:     Head: Normocephalic and atraumatic.  Eyes:     Conjunctiva/sclera: Conjunctivae normal.     Pupils: Pupils are equal, round, and reactive to light.  Neck:     Vascular: No JVD.  Cardiovascular:     Rate and Rhythm: Normal rate and regular rhythm.     Pulses: Intact distal pulses.          Dorsalis pedis pulses are 1+ on the right side and 1+ on the left side.       Posterior tibial pulses are 0 on the right side and 0 on the left side.     Heart sounds: No murmur heard.   Pulmonary:     Effort: Pulmonary effort is normal.     Breath sounds: Normal breath sounds. No wheezing or rales.  Abdominal:     General: Bowel sounds are normal.     Palpations: Abdomen is soft.     Tenderness: There is no rebound.  Musculoskeletal:        General: No tenderness. Normal range of motion.     Left lower leg: No edema.  Lymphadenopathy:     Cervical: No cervical adenopathy.  Skin:    General: Skin is warm and dry.  Neurological:     Mental Status: She is alert and oriented to person, place, and time.     Cranial Nerves: No cranial nerve deficit.      Lab Results: BMP Recent Labs    10/26/20 1315 11/25/20 1055 11/25/20 2159 11/26/20 0330 11/26/20 0736  NA 134   < >  129* 136 135  K 5.0   < > 4.2 3.5 3.5  CL 94*   < > 95* 103 103  CO2 23   < > 24 27 26   GLUCOSE 213*   < > 391* 129* 160*  BUN 24   < > 22 19 17   CREATININE 1.01*   < > 1.05* 0.86 0.93  CALCIUM 9.6   < > 8.5* 8.4* 8.1*  GFRNONAA 47*   < > 49* >60 57*  GFRAA 55*  --   --   --   --    < > = values in this interval not displayed.    CBC Recent Labs  Lab 11/25/20 1250 11/26/20 0330  WBC 9.3 9.7  RBC 3.87 3.97  HGB 10.5* 10.5*  HCT 32.8* 33.5*  PLT 199 195  MCV 84.8 84.4  MCH 27.1 26.4  MCHC 32.0 31.3  RDW 18.5* 18.5*  LYMPHSABS 1.4  --   MONOABS 0.5  --   EOSABS 0.0  --   BASOSABS 0.0  --     HEMOGLOBIN A1C Lab Results  Component Value Date   HGBA1C 9.8 (H)  11/25/2020   MPG 234.56 11/25/2020    Cardiac Panel (last 3 results) Results for RASHIA, MCKESSON (MRN [id number]) as of 11/26/2020 15:31  Ref. Range 11/25/2020 10:55 11/25/2020 12:50  Troponin I (High Sensitivity) Latest Ref Range: <18 ng/L 251 (HH) 1,236 (HH)   BNP (last 3 results) Recent Labs    10/26/20 1315  BNP 340.1*    TSH Recent Labs    06/06/20 1623 10/26/20 1315 11/25/20 1218  TSH 0.95 2.360 6.807*      Hepatic Function Panel Recent Labs    06/06/20 1623 11/25/20 1055  PROT 7.4 5.9*  ALBUMIN 3.8 2.4*  AST 39* 59*  ALT 22 51*  ALKPHOS 92 126  BILITOT 0.4 0.8    Imaging: CXR 11/26/2020: Patient is post median sternotomy and CABG. Mild cardiomegaly is stable. Unchanged elevation of right hemidiaphragm. No focal airspace disease. Mild interstitial coarsening which appears chronic. No pulmonary edema, large pleural effusion, or pneumothorax. No acute osseous abnormalities are seen. Degenerative change of the left shoulder.  IMPRESSION: 1. No acute abnormality or significant change from yesterday. 2. Stable mild cardiomegaly. Unchanged elevation of right hemidiaphragm.    CARDIAC STUDIES:  EKG 11/25/2020 11:20 AM: Sinus rhythm 70 bpm.  Right bundle branch block,  left posterior fascicular block.  Minimal ST elevation in inferior leads, not STEMI   EKG 11/25/2020 10:48 AM: Supraventricular tachycardia, RBBB, LPFB Diffuse ST segment changes, consider ischemia  Echocardiogram pending:   Assessment/Plan  87y.o.Caucasianfemalewith hyperlipidemia, type 2 diabetes mellitus, peripheral neuropathy, CAD s/p CABG, h/o breast cancer, admitted with palpitations.  Tachycardia: Supraventricular tachycardia.  Conversion with 6 mg adenosine in ER on 11/25/20 Discussed with on-call EP Dr. Caryl Comes.   Close review of telemetry, as well as Zio patch tracing suggests atrial tachycardia as the most likely rhythm. Given her advanced age, less likely to be a candidate for ablation. Started on amiodarone PO 200 mg bid, increased to 400 mg bid today. If she has any recurrence, will use IV adenosine.  Troponin elevation: Known CAD, s/p CABG several years ago Likely demand ischemia in the setting of tachycardia episodes. That said, troponin has increased from 251 to 1236.  ACS cannot be excluded. Patient wants to avoid invasive management.  Continue conservative medical management. Will treat with IV heparin for 48 hrs. Continue aspirin, plavix (without loading), amlodipine. Continue rosuvastatin 10 mg. Hold off beta-blocker at this time, given initiation of amiodarone and recent use of corlanor.  Will consider adding tomorrow.  Aortic stenosis: Moderate at baseline.  Will obtain echocardiogram.  Hyperglycemia: Likely medication induced due to prednisone, which she has been on 15 mg for last 3-4 weeks for back pain. BG >400 today. I have encouraged the daughter to speak with her PCP regarding alternate options for prednisone for outpatient management of diabetes. Appreciate medicine team input QM:VHQIONGE management.   Dementia: I encouraged patient's daughter to stay in the hospital with the patient.  Hopefully, this will avoid any hospital delirium.  Continue donepezil.  Physical therapy, case manager consulted. She may need home health. Unable to administer insulin.     Nigel Mormon, MD Pager: [phone] Office: [phone]

## 2020-11-26 NOTE — Progress Notes (Signed)
Called to assist with administration of adenosine.  Patient has been in atrial tachycardia with no relief from metoprolol.  Dr. Virgina Jock bedside during medication administration.  Patient also on zoll monitor during administration.

## 2020-11-26 NOTE — Progress Notes (Signed)
Called rapid response nurse Mindy, RN to update her that pts heart rate is 70. Will continue to monitor.

## 2020-11-26 NOTE — Progress Notes (Signed)
ANTICOAGULATION CONSULT NOTE - Follow Up Consult  Pharmacy Consult for Heparin Indication: chest pain/ACS  Allergies  Allergen Reactions  . Tape Other (See Comments)    TAPE TEARS AND BRUISES THE SKIN VERY EASILY- PATIENT HAS VERY THIN SKIN!!  . Lisinopril Hives  . Statins Other (See Comments)    Muscle pain    Patient Measurements: Height: 5\' 1"  (154.9 cm) Weight: 58.5 kg (128 lb 15.5 oz) IBW/kg (Calculated) : 47.8 kg Heparin Dosing Weight: 55 kg  Vital Signs: Temp: 97.7 F (36.5 C) (03/27 0400) Temp Source: Oral (03/27 0400) BP: 123/75 (03/27 0400) Pulse Rate: 110 (03/27 0400)  Labs: Recent Labs    11/25/20 1055 11/25/20 1250 11/25/20 1913 11/25/20 2159 11/26/20 0330  HGB  --  10.5*  --   --  10.5*  HCT  --  32.8*  --   --  33.5*  PLT  --  199  --   --  195  LABPROT 13.2  --   --   --   --   INR 1.0  --   --   --   --   HEPARINUNFRC  --   --   --  0.41 0.32  CREATININE 1.22*  --  1.18* 1.05* 0.86  TROPONINIHS 251* 1,236*  --   --   --     Estimated Creatinine Clearance: 32.2 mL/min (by C-G formula based on SCr of 0.86 mg/dL).  Assessment: 87 yo female with a history of CAD s/p CABG presents with SVT and troponin elevation. PTA the patient is not on anticoagulation. Pharmacy is consulted to dose heparin for medical management.  Heparin level this morning is therapeutic at 0.32 while running at 650 units/hr. CBC is stable. Per the RN, there have been no issues with the infusion and the patient is without signs or symptoms of bleeding.  Goal of Therapy:  Heparin level 0.3-0.7 units/ml Monitor platelets by anticoagulation protocol: Yes   Plan:  Continue heparin IV at 650 units/hr Obtain daily heparin level and CBC Monitor for signs and symptoms of bleeding  Shauna Hugh, PharmD, [place]  PGY-1 Pharmacy Resident 11/26/2020 8:48 AM  Please check [email or web address] for unit-specific pharmacy phone numbers.

## 2020-11-26 NOTE — Progress Notes (Signed)
Inpatient Diabetes Program Recommendations  AACE/ADA: New Consensus Statement on Inpatient Glycemic Control (2015)  Target Ranges:  Prepandial:   less than 140 mg/dL      Peak postprandial:   less than 180 mg/dL (1-2 hours)      Critically ill patients:  140 - 180 mg/dL   Lab Results  Component Value Date   GLUCAP 169 (H) 11/26/2020   HGBA1C 9.8 (H) 11/25/2020    Review of Glycemic Control SVT and troponin elevation Diabetes history: DM type 2 Outpatient Diabetes medications: none noted (renal function will make oral dosing difficult) Current orders for Inpatient glycemic control:  IV insulin  HHS TSH 6.807 A1c 9.8  Inpatient Diabetes Program Recommendations:    -  Start Levemir 10 units -  Novolog 0-9 units tid + hs  May need insulin at time of d/c. Diabetes Coordinator to see pt on 3/28. Coordinator working remotely this weekend.   Thanks,  Tama Headings RN, MSN, BC-ADM Inpatient Diabetes Coordinator Team Pager [phone] (8a-5p)

## 2020-11-27 ENCOUNTER — Other Ambulatory Visit: Payer: Self-pay | Admitting: Internal Medicine

## 2020-11-27 DIAGNOSIS — I350 Nonrheumatic aortic (valve) stenosis: Secondary | ICD-10-CM

## 2020-11-27 DIAGNOSIS — I480 Paroxysmal atrial fibrillation: Secondary | ICD-10-CM

## 2020-11-27 LAB — COMPREHENSIVE METABOLIC PANEL
ALT: 39 U/L (ref 0–44)
AST: 37 U/L (ref 15–41)
Albumin: 2.3 g/dL — ABNORMAL LOW (ref 3.5–5.0)
Alkaline Phosphatase: 113 U/L (ref 38–126)
Anion gap: 6 (ref 5–15)
BUN: 15 mg/dL (ref 8–23)
CO2: 28 mmol/L (ref 22–32)
Calcium: 8.3 mg/dL — ABNORMAL LOW (ref 8.9–10.3)
Chloride: 102 mmol/L (ref 98–111)
Creatinine, Ser: 1.03 mg/dL — ABNORMAL HIGH (ref 0.44–1.00)
GFR, Estimated: 50 mL/min — ABNORMAL LOW (ref >60)
Glucose, Bld: 124 mg/dL — ABNORMAL HIGH (ref 70–99)
Potassium: 3.6 mmol/L (ref 3.5–5.1)
Sodium: 136 mmol/L (ref 135–145)
Total Bilirubin: 0.4 mg/dL (ref 0.3–1.2)
Total Protein: 5.7 g/dL — ABNORMAL LOW (ref 6.5–8.1)

## 2020-11-27 LAB — CBC
HCT: 36.1 % (ref 36.0–46.0)
Hemoglobin: 11.1 g/dL — ABNORMAL LOW (ref 12.0–15.0)
MCH: 26.4 pg (ref 26.0–34.0)
MCHC: 30.7 g/dL (ref 30.0–36.0)
MCV: 85.7 fL (ref 80.0–100.0)
Platelets: 219 10*3/uL (ref 150–400)
RBC: 4.21 MIL/uL (ref 3.87–5.11)
RDW: 18.8 % — ABNORMAL HIGH (ref 11.5–15.5)
WBC: 9.7 10*3/uL (ref 4.0–10.5)
nRBC: 0 % (ref 0.0–0.2)

## 2020-11-27 LAB — GLUCOSE, CAPILLARY
Glucose-Capillary: 106 mg/dL — ABNORMAL HIGH (ref 70–99)
Glucose-Capillary: 124 mg/dL — ABNORMAL HIGH (ref 70–99)
Glucose-Capillary: 144 mg/dL — ABNORMAL HIGH (ref 70–99)
Glucose-Capillary: 177 mg/dL — ABNORMAL HIGH (ref 70–99)
Glucose-Capillary: 230 mg/dL — ABNORMAL HIGH (ref 70–99)
Glucose-Capillary: 249 mg/dL — ABNORMAL HIGH (ref 70–99)
Glucose-Capillary: 253 mg/dL — ABNORMAL HIGH (ref 70–99)
Glucose-Capillary: 310 mg/dL — ABNORMAL HIGH (ref 70–99)

## 2020-11-27 LAB — HEPARIN LEVEL (UNFRACTIONATED): Heparin Unfractionated: 0.21 IU/mL — ABNORMAL LOW (ref 0.30–0.70)

## 2020-11-27 MED ORDER — APIXABAN 2.5 MG PO TABS
2.5000 mg | ORAL_TABLET | Freq: Two times a day (BID) | ORAL | Status: DC
  Administered 2020-11-27 – 2020-11-28 (×3): 2.5 mg via ORAL
  Filled 2020-11-27 (×3): qty 1

## 2020-11-27 MED ORDER — INSULIN GLARGINE 100 UNIT/ML ~~LOC~~ SOLN
3.0000 [IU] | Freq: Once | SUBCUTANEOUS | Status: AC
  Administered 2020-11-27: 3 [IU] via SUBCUTANEOUS
  Filled 2020-11-27: qty 0.03

## 2020-11-27 MED ORDER — INSULIN DETEMIR 100 UNIT/ML ~~LOC~~ SOLN
8.0000 [IU] | Freq: Every day | SUBCUTANEOUS | Status: DC
  Administered 2020-11-28: 8 [IU] via SUBCUTANEOUS
  Filled 2020-11-27: qty 0.08

## 2020-11-27 MED ORDER — AMIODARONE HCL 200 MG PO TABS
200.0000 mg | ORAL_TABLET | Freq: Three times a day (TID) | ORAL | Status: DC
  Administered 2020-11-27 – 2020-11-28 (×4): 200 mg via ORAL
  Filled 2020-11-27 (×4): qty 1

## 2020-11-27 MED ORDER — ROSUVASTATIN CALCIUM 10 MG PO TABS: 10.0000 mg | ORAL_TABLET | Freq: Every day | ORAL | 1 refills | Status: DC

## 2020-11-27 MED ORDER — INSULIN GLARGINE 100 UNITS/ML SOLOSTAR PEN: 8.0000 [IU] | PEN_INJECTOR | Freq: Every day | SUBCUTANEOUS | 0 refills | Status: DC

## 2020-11-27 MED ORDER — BLOOD GLUCOSE MONITOR KIT: PACK | 0 refills | Status: AC

## 2020-11-27 MED ORDER — INSULIN DETEMIR 100 UNIT/ML ~~LOC~~ SOLN
4.0000 [IU] | Freq: Every day | SUBCUTANEOUS | Status: DC
  Administered 2020-11-27: 4 [IU] via SUBCUTANEOUS
  Filled 2020-11-27: qty 0.04

## 2020-11-27 MED ORDER — APIXABAN 2.5 MG PO TABS: 2.5000 mg | ORAL_TABLET | Freq: Two times a day (BID) | ORAL | 1 refills | Status: DC

## 2020-11-27 MED ORDER — AMIODARONE HCL 200 MG PO TABS: 200.0000 mg | ORAL_TABLET | Freq: Three times a day (TID) | ORAL | 1 refills | Status: DC

## 2020-11-27 MED FILL — ACCU-CHEK GUIDE W/DEVICE KI: W/DEVICE | 1 days supply | Qty: 1 | Fill #0

## 2020-11-27 MED FILL — ELIQUIS 2.5 MG TABLET: 2.5 | 30 days supply | Qty: 60 | Fill #0

## 2020-11-27 MED FILL — AMIODARONE HCL 200 MG TAB: 200 | 20 days supply | Qty: 60 | Fill #0

## 2020-11-27 MED FILL — ACCU-CHEK GUIDE TEST STRIP: 25 days supply | Qty: 100 | Fill #0

## 2020-11-27 MED FILL — ACCU-CHEK SOFTCLIX LANCETS: 25 days supply | Qty: 100 | Fill #0

## 2020-11-27 MED FILL — LANTUS SOLOSTAR 100 UNITS/M: 100 | 28 days supply | Qty: 3 | Fill #0

## 2020-11-27 MED FILL — PENTIPS 32G X 4 MM MISC: 32G X 4 MM | 30 days supply | Qty: 100 | Fill #0

## 2020-11-27 MED FILL — ROSUVASTATIN CALCIUM 10 MG: 10 | 30 days supply | Qty: 30 | Fill #0

## 2020-11-27 NOTE — TOC Transition Note (Signed)
Transition of Care [facility]) - CM/SW Discharge Note   Patient Details  Name: Kennisha Qin [facility] MRN: [id number] Date of Birth: Jul 23, 1924  Transition of Care [facility]) CM/SW Contact:  Ninfa Meeker, RN Phone Number: 11/27/2020, 11:11 AM   Clinical Narrative:   Pt is a 87 y.o. female admitted 11/25/20 with palpitations and several episodes of tachycardia over the past few weeks; code STEMI initially called, but ultimately cancelled. Workup for supraventricular tachycardia requiring IV adenosine. Case manager spoke with patient's daughter-Donna Bush, concerning discharge plan and HH needs. Choice for Home Health Agency was offered.[name] had no preference, discussed options. Referral was called to Adela Lank, [facility] Liaison. [name] states that her mom was independent prior to her hospitalization and she is hopefully she will be able to return to being independent soon. 3n1 to be delivered to room by [employer].   Final next level of care: [place] Barriers to Discharge: No Barriers Identified   Patient Goals and CMS Choice Patient states their goals for this hospitalization and ongoing recovery are:: get better CMS Medicare.gov Compare Post Acute Care list provided to:: Patient Represenative (must comment) (Daughter: Lawerance Cruel) Choice offered to / list presented to : Adult Children  Discharge Placement                       Discharge Plan and Services In-house Referral: NA Discharge Planning Services: CM Consult Post Acute Care Choice: [place]          DME Arranged: 3-N-1 DME Agency: [employer] Date DME Agency Contacted: 11/27/20 Time DME Agency Contacted: [id number] Representative spoke with at DME Agency: Sheila [place]: RN,PT,OT,Nurse's Aide [place]: [place] Date [place]: 11/27/20 Time [place]: 39 Representative spoke with at [place]: [place] ([place]) Interventions     Readmission Risk Interventions No flowsheet data found.

## 2020-11-27 NOTE — Evaluation (Signed)
Physical Therapy Evaluation Patient Details Name: Sandy Owens [facility] MRN: [id number] DOB: September 02, 1924 Today's Date: 11/27/2020   History of Present Illness  Pt is a 87 y.o. female admitted 11/25/20 with palpitations and several episodes of tachycardia over the past few weeks; code STEMI initially called, but ultimately cancelled. Workup for supraventricular tachycardia requiring IV adenosine; troponin elevation likely demand ischemia. PMH includes CAD (s/p CABG 2001), DM, HTN, neuropathy, L breast CA, L TKA, R THA, dementia.    Clinical Impression  Pt presents with an overall decrease in functional mobility secondary to above. PTA, pt mod indep ambulating with RW, mod indep with majority of ADLs; lives alone with daughter nearby who visits daily to assist with iADLs. Today, pt able to transfers and gait training with RW, up to minA for mobility. Pt's family present and supportive; they plan to have pt return home with initial 24/7 assist. Pt would benefit from continued acute PT services to maximize functional mobility and independence prior to d/c with HHPT services.   SpO2 95% on RA Seated BP 156/67, HR 92 Post-mobility BP 142/124, HR 106    Follow Up Recommendations Home health PT;Supervision for mobility/OOB    Equipment Recommendations  None recommended by PT    Recommendations for Other Services       Precautions / Restrictions Precautions Precautions: Fall Restrictions Weight Bearing Restrictions: No      Mobility  Bed Mobility Overal bed mobility: Needs Assistance Bed Mobility: Supine to Sit     Supine to sit: Min assist     General bed mobility comments: Initiating movement to EOB well, ultimately requiring minA for HHA to elevate trunk, then scooting to EOB without assist    Transfers Overall transfer level: Needs assistance Equipment used: Rolling walker (2 wheeled) Transfers: Sit to/from Stand Sit to Stand: Min guard         General transfer comment:  Multiple sit<>stands from EOB and recliner to RW, min guard for balance; cues for hand placement; pt with poor eccentric control (reports baseline)  Ambulation/Gait Ambulation/Gait assistance: Min guard Gait Distance (Feet): 12 Feet Assistive device: Rolling walker (2 wheeled) Gait Pattern/deviations: Step-through pattern;Decreased stride length;Trunk flexed Gait velocity: Decreased   General Gait Details: Slow, steady gait with RW and min guard for balance; pt reports legs feeling weak, but improved from yesterday; no overt knee instability noted  Stairs            Wheelchair Mobility    Modified Rankin (Stroke Patients Only)       Balance Overall balance assessment: Needs assistance   Sitting balance-Leahy Scale: Fair       Standing balance-Leahy Scale: Poor Standing balance comment: Reliant on UE support                             Pertinent Vitals/Pain Pain Assessment: No/denies pain    Home Living Family/patient expects to be discharged to:: Private residence Living Arrangements: Alone Available Help at Discharge: Family;Available 24 hours/day Type of Home: House Home Access: Stairs to enter;Ramped entrance Entrance Stairs-Rails:  (doorway) Entrance Stairs-Number of Steps: Ramp + 1 step Home Layout: One level Home Equipment: Walker - 2 wheels;Tub bench Additional Comments: Daughter lives nearby and visits daily to assist without iADLs. Daughter and son plan to stay with pt for initial 24/7 support    Prior Function Level of Independence: Needs assistance   Gait / Transfers Assistance Needed: Mod indep ambulating with RW  ADL's /  Homemaking Assistance Needed: Performs sponge bathing at sink sitting on bench and puts feet in basin. Daughter assists with meal prep (pt can microwave; does not use stove), household tasks, medication management and other iADLs  Comments: Low vision at baseline     Hand Dominance        Extremity/Trunk  Assessment   Upper Extremity Assessment Upper Extremity Assessment: Generalized weakness    Lower Extremity Assessment Lower Extremity Assessment: Generalized weakness       Communication   Communication: No difficulties  Cognition Arousal/Alertness: Awake/alert Behavior During Therapy: WFL for tasks assessed/performed Overall Cognitive Status: Within Functional Limits for tasks assessed                                 General Comments: Pt endorses baseline STM memory deficits, otherwise [facility] for simple tasks. Great hearing      General Comments General comments (skin integrity, edema, etc.): Pt's daughter and grandson present, supportive; provided gait belt for home use. Increased time discussing safe d/c plan; family and pt plan to return home with increased family assist initially    Exercises     Assessment/Plan    PT Assessment Patient needs continued PT services  PT Problem List Decreased strength;Decreased activity tolerance;Decreased balance;Decreased mobility;Cardiopulmonary status limiting activity       PT Treatment Interventions DME instruction;Gait training;Stair training;Functional mobility training;Therapeutic activities;Therapeutic exercise;Balance training;Patient/family education    PT Goals (Current goals can be found in the Care Plan section)  Acute Rehab PT Goals Patient Stated Goal: Return home PT Goal Formulation: With patient/family Time For Goal Achievement: 12/11/20 Potential to Achieve Goals: Good    Frequency Min 3X/week   Barriers to discharge        Co-evaluation               AM-PAC PT "6 Clicks" Mobility  Outcome Measure Help needed turning from your back to your side while in a flat bed without using bedrails?: A Little Help needed moving from lying on your back to sitting on the side of a flat bed without using bedrails?: A Little Help needed moving to and from a bed to a chair (including a wheelchair)?: A  Little Help needed standing up from a chair using your arms (e.g., wheelchair or bedside chair)?: A Little Help needed to walk in hospital room?: A Little Help needed climbing 3-5 steps with a railing? : A Little 6 Click Score: 18    End of Session Equipment Utilized During Treatment: Gait belt Activity Tolerance: Patient tolerated treatment well Patient left: in chair;with call bell/phone within reach;with chair alarm set;with family/visitor present Nurse Communication: Mobility status PT Visit Diagnosis: Other abnormalities of gait and mobility (R26.89);Muscle weakness (generalized) (M62.81)    Time: 5188-4166 PT Time Calculation (min) (ACUTE ONLY): 28 min   Charges:   PT Evaluation $PT Eval Moderate Complexity: 1 Mod PT Treatments $Therapeutic Activity: 8-22 mins      Mabeline Caras, PT, DPT Acute Rehabilitation Services  Pager [phone] Office [phone]  Derry Lory 11/27/2020, 9:39 AM

## 2020-11-27 NOTE — Progress Notes (Signed)
1430 Dr. Virgina Jock notified of 30 minute episode of patient going in and out of SVT and NSR. Will keep patient overnight and monitor rhythm. Spoke with patient and family and they were in agreement with plan and felt better about patient being monitored overnight. Dr. Maren Beach notified that patient would be staying overnight. Patient had a few more episodes of SVT but they did not last very long, rate was up to 120's and patient was not symptomatic.

## 2020-11-27 NOTE — Evaluation (Signed)
Occupational Therapy Evaluation Patient Details Name: Sandy Owens [facility] MRN: [id number] DOB: 03-18-1924 Today's Date: 11/27/2020    History of Present Illness 87 y.o. female admitted 11/25/20 with palpitations and several episodes of tachycardia over the past few weeks; code STEMI initially called, but ultimately cancelled. Workup for supraventricular tachycardia requiring IV adenosine; troponin elevation likely demand ischemia. PMH includes CAD (s/p CABG 2001), DM, HTN, neuropathy, L breast CA, L TKA, R THA, dementia.   Clinical Impression   PTA, pt was living alone and was performing ADLs and light IADLs; daughter lives nearby and family very supportive. Pt currently requiring Min Guard A-Min A for LB ADLs and functional mobility. Pt presenting with decreased balance and activity tolerance. Pt very motivated but limited by fatigue. During LB bathing, HR elevating to 116 and noting pt with SOB and heavy breathing. Pt would benefit from further acute OT to facilitate safe dc. Recommend dc to home with [place] aide, HHPT, and HHOT for further OT to optimize safety, independence with ADLs, and return to PLOF.     Follow Up Recommendations  Home health OT;Supervision/Assistance - 24 hour ([place] aide)    Equipment Recommendations  3 in 1 bedside commode (Delivered to room during eval)    Recommendations for Other Services PT consult     Precautions / Restrictions Precautions Precautions: Fall Restrictions Weight Bearing Restrictions: No      Mobility Bed Mobility Overal bed mobility: Needs Assistance Bed Mobility: Supine to Sit     Supine to sit: Min assist     General bed mobility comments: In recliner upon arrival    Transfers Overall transfer level: Needs assistance Equipment used: Rolling walker (2 wheeled) Transfers: Sit to/from Stand Sit to Stand: Min guard;Min assist         General transfer comment: Min guard A for safety. Min A for safe descent    Balance Overall  balance assessment: Needs assistance Sitting-balance support: Feet supported;No upper extremity supported Sitting balance-Leahy Scale: Fair     Standing balance support: No upper extremity supported;During functional activity;Bilateral upper extremity supported Standing balance-Leahy Scale: Poor Standing balance comment: Reliant on UE support                           ADL either performed or assessed with clinical judgement   ADL Overall ADL's : Needs assistance/impaired Eating/Feeding: Set up;Sitting   Grooming: Set up;Supervision/safety;Sitting   Upper Body Bathing: Set up;Supervision/ safety;Sitting   Lower Body Bathing: Minimal assistance;Sit to/from stand   Upper Body Dressing : Supervision/safety;Set up;Sitting   Lower Body Dressing: Minimal assistance;Sit to/from stand Lower Body Dressing Details (indicate cue type and reason): Min A for donning tennis shoes Toilet Transfer: Minimal assistance;Ambulation;RW (simulated to recliner) Toilet Transfer Details (indicate cue type and reason): Min A for safe descent         Functional mobility during ADLs: Minimal assistance;Min guard;Rolling walker General ADL Comments: Pt presenting with decreased balance and acivity tolerance. HR 90-109 with oral care at sink and then elevating to 116 with peri care at sink.     Vision Baseline Vision/History: Wears glasses Wears Glasses: At all times Patient Visual Report: No change from baseline       Perception     Praxis      Pertinent Vitals/Pain Pain Assessment: No/denies pain     Hand Dominance Right   Extremity/Trunk Assessment Upper Extremity Assessment Upper Extremity Assessment: Generalized weakness   Lower Extremity Assessment Lower Extremity  Assessment: Generalized weakness   Cervical / Trunk Assessment Cervical / Trunk Assessment: Kyphotic   Communication Communication Communication: No difficulties   Cognition Arousal/Alertness:  Awake/alert Behavior During Therapy: WFL for tasks assessed/performed Overall Cognitive Status: Within Functional Limits for tasks assessed                                 General Comments: Pt endorses baseline STM memory deficits, otherwise [facility] for simple tasks.   General Comments  BP stable throughout. HR 80-90s at rest. 106 with grooming. 116 with peri care    Exercises     Shoulder Instructions      Home Living Family/patient expects to be discharged to:: Private residence Living Arrangements: Alone Available Help at Discharge: Family;Available 24 hours/day Type of Home: House Home Access: Stairs to enter;Ramped entrance Entrance Stairs-Number of Steps: Ramp + 1 step Entrance Stairs-Rails:  (doorway) Home Layout: One level     Bathroom Shower/Tub: Teacher, early years/pre: Standard     Home Equipment: Environmental consultant - 2 wheels;Tub bench   Additional Comments: Daughter lives nearby and visits daily to assist without iADLs. Daughter and son plan to stay with pt for initial 24/7 support      Prior Functioning/Environment Level of Independence: Needs assistance  Gait / Transfers Assistance Needed: Mod indep ambulating with RW ADL's / Homemaking Assistance Needed: Performs sponge bathing at sink sitting on bench and puts feet in basin. Daughter assists with meal prep (pt can microwave; does not use stove), household tasks, medication management and other iADLs   Comments: Low vision at baseline. Daughter reports that pt has been very fatigued and thus will avoid bathing at times.        OT Problem List: Decreased strength;Decreased range of motion;Decreased activity tolerance;Impaired balance (sitting and/or standing);Impaired vision/perception;Decreased knowledge of precautions;Decreased knowledge of use of DME or AE;Pain      OT Treatment/Interventions: Self-care/ADL training;Therapeutic exercise;Energy conservation;DME and/or AE instruction;Therapeutic  activities;Patient/family education    OT Goals(Current goals can be found in the care plan section) Acute Rehab OT Goals Patient Stated Goal: Return home OT Goal Formulation: With patient Time For Goal Achievement: 12/12/83 Potential to Achieve Goals: Good  OT Frequency: Min 2X/week   Barriers to D/C:            Co-evaluation              AM-PAC OT "6 Clicks" Daily Activity     Outcome Measure Help from another person eating meals?: A Little Help from another person taking care of personal grooming?: A Little Help from another person toileting, which includes using toliet, bedpan, or urinal?: A Little Help from another person bathing (including washing, rinsing, drying)?: A Little Help from another person to put on and taking off regular upper body clothing?: A Little Help from another person to put on and taking off regular lower body clothing?: A Little 6 Click Score: 18   End of Session Equipment Utilized During Treatment: Rolling walker Nurse Communication: Mobility status  Activity Tolerance: Patient tolerated treatment well Patient left: in chair;with call bell/phone within reach;with chair alarm set;with family/visitor present  OT Visit Diagnosis: Unsteadiness on feet (R26.81);Other abnormalities of gait and mobility (R26.89);Muscle weakness (generalized) (M62.81);History of falling (Z91.81);Pain Pain - Right/Left:  (Lower) Pain - part of body:  (Back)                Time: 9518-8416 OT Time  Calculation (min): 38 min Charges:  OT General Charges $OT Visit: 1 Visit OT Evaluation $OT Eval Low Complexity: 1 Low OT Treatments $Self Care/Home Management : 23-37 mins  Aliviyah Malanga MSOT, OTR/L Acute Rehab Pager: [phone] Office: [place] 11/27/2020, 1:14 PM

## 2020-11-27 NOTE — Progress Notes (Addendum)
PROGRESS NOTE    [facility]  CWC:[id number] DOB: Oct 13, 1923 DOA: 11/25/2020 PCP: [name], MD   Chief Complaint  Patient presents with  . Code STEMI  Brief Narrative: 87 year old female with CAD, CABG history, HTN, T2DM with peripheral neuropathy, hypothyroidism, history of subarachnoid hemorrhage, CKD stage III, history of breast cancer, HLD, chronic back pain, cognitive impairment admitted with atrial tachycardia.  In the ED patient received education and converted to sinus rhythm, was admitted under cardiology service and found to have uncontrolled hyperglycemia and TRH consulted for blood sugar management.  Subjective: Seen and examined this morning.  Family was at the bedside.  Cardiology at the bedside.   Patient has no new complaints.  Resting comfortably.   Patient had rapid response tachycardia given adenosine yesterday  Assessment & Plan:  Uncontrolled hyperglycemia with new onset diabetes with recent prednisone use for few weeks.  HbA1c- at 9.8. Initially blood sugar was up to 500 needing insulin drip.Blood sugar is stabilized on Lantus further decrease dose to 4 units today.Will need home insulin DM coordinator consulted discussed w/ nursing staff to educate patients family at the bedside for Accu-Chek and insulin injection. Will need close follow-up with PCP versus endocrinology as outpatient. Discussed with patient's family nurse at the bedside in detail.  May need to switch to oral regimen on outpatient basis. Insulin uptrending- change to 8 units daily at home. Recent Labs  Lab 11/26/20 2357 11/27/20 0108 11/27/20 0446 11/27/20 0646 11/27/20 0817  GLUCAP 205* 177* 144* 124* 106*   Tachycardia/SVT: Per cardiology continue with amiodarone  Hypertension Hyperlipidemia CAD status post CABG Elevated troponin Blood pressure fairly stable continue current regimen,  amlodipine, Crestor, switched to Eliquis- plan  Per cardio  Moderate aortic  stenosis: per cardio  Chronic back pain on prednisone 15 mg for few weeks, will need to taper slowly.  Continue current dose-and follow-up with PCP and as you taper down the steroid please also consider tapering off insulin and switching to p.o.  Dementia: Daughter at the bedside, continue fall precaution delirium precaution continue home donepezil  Hypothyroidism continue her Synthroid  Diet Order            Diet - low sodium heart healthy           Diet Carb Modified Fluid consistency: Thin; Room service appropriate? Yes  Diet effective now               Patient's Body mass index is 25.28 kg/m. DVT prophylaxis: apixaban (ELIQUIS) tablet 2.5 mg Start: 11/27/20 1015 SCDs Start: 11/25/20 1212 Code Status:   Code Status: Full Code  Family Communication: plan of care discussed with patient and her family at bedside.  Status is: Inpatient Remains inpatient appropriate because:Inpatient level of care appropriate due to severity of illness    Unresulted Labs (From admission, onward)          Start     Ordered   11/25/20 1101  CBC with Differential/Platelet  Once,   STAT        11/25/20 1101         Medications reviewed:  Scheduled Meds: . amiodarone  200 mg Oral TID  . amLODipine  5 mg Oral Daily  . apixaban  2.5 mg Oral BID  . donepezil  5 mg Oral QHS  . insulin aspart  0-9 Units Subcutaneous Q4H  . insulin detemir  4 Units Subcutaneous Daily  . levothyroxine  88 mcg Oral Q0600  . predniSONE  15  mg Oral Q breakfast  . rosuvastatin  10 mg Oral Daily   Continuous Infusions:  Consultants:see note  Procedures:see note  Antimicrobials: Anti-infectives (From admission, onward)   None     Culture/Microbiology    Component Value Date/Time   SDES URINE, CATHETERIZED 11/28/2017 1430   SPECREQUEST  11/28/2017 1430    Normal Performed at [facility] [address] [place], [place] [address]    CULT >=100,000 COLONIES/mL ESCHERICHIA COLI (A) 11/28/2017 1430    REPTSTATUS 11/30/2017 FINAL 11/28/2017 1430    Other culture-see note  Objective: Vitals: Today's Vitals   11/26/20 2142 11/26/20 2344 11/27/20 0441 11/27/20 0729  BP:  (!) 156/58 (!) 175/63 (!) 161/53  Pulse:  77 68 77  Resp:  19 17 18   Temp:  98.1 F (36.7 C) 98.3 F (36.8 C) 98.2 F (36.8 C)  TempSrc:  Oral Oral Oral  SpO2:  100% 100% 100%  Weight:   60.7 kg   Height:      PainSc: 0-No pain       Intake/Output Summary (Last 24 hours) at 11/27/2020 1109 Last data filed at 11/27/2020 0530 Gross per 24 hour  Intake 420.51 ml  Output --  Net 420.51 ml   Filed Weights   11/25/20 1300 11/26/20 0400 11/27/20 0441  Weight: 55 kg 58.5 kg 60.7 kg   Weight change: 5.7 kg  Intake/Output from previous day: 03/27 0701 - 03/28 0700 In: 420.5 [I.V.:420.5] Out: -  Intake/Output this shift: No intake/output data recorded. Filed Weights   11/25/20 1300 11/26/20 0400 11/27/20 0441  Weight: 55 kg 58.5 kg 60.7 kg    Examination: General exam: AAO,NAD, weak appearing. HEENT:Oral mucosa moist, Ear/Nose WNL grossly, dentition normal. Respiratory system: bilaterally clear,no wheezing or crackles,no use of accessory muscle Cardiovascular system: S1 & S2 +, No JVD,. Gastrointestinal system: Abdomen soft, NT,ND, BS+ Nervous System:Alert, awake, moving extremities and grossly nonfocal Extremities: No edema, distal peripheral pulses palpable.  Skin: No rashes,no icterus. MSK: Normal muscle bulk,tone, power  Data Reviewed: I have personally reviewed following labs and imaging studies CBC: Recent Labs  Lab 11/25/20 1250 11/26/20 0330 11/27/20 0647  WBC 9.3 9.7 9.7  NEUTROABS 7.1  --   --   HGB 10.5* 10.5* 11.1*  HCT 32.8* 33.5* 36.1  MCV 84.8 84.4 85.7  PLT 199 195 671   Basic Metabolic Panel: Recent Labs  Lab 11/25/20 1913 11/25/20 2159 11/26/20 0330 11/26/20 0736 11/27/20 0647  NA 127* 129* 136 135 136  K 4.8 4.2 3.5 3.5 3.6  CL 94* 95* 103 103 102  CO2 20* 24 27  26 28   GLUCOSE 513* 391* 129* 160* 124*  BUN 23 22 19 17 15   CREATININE 1.18* 1.05* 0.86 0.93 1.03*  CALCIUM 8.4* 8.5* 8.4* 8.1* 8.3*   GFR: Estimated Creatinine Clearance: 27.3 mL/min (A) (by C-G formula based on SCr of 1.03 mg/dL (H)). Liver Function Tests: Recent Labs  Lab 11/25/20 1055 11/27/20 0647  AST 59* 37  ALT 51* 39  ALKPHOS 126 113  BILITOT 0.8 0.4  PROT 5.9* 5.7*  ALBUMIN 2.4* 2.3*   No results for input(s): LIPASE, AMYLASE in the last 168 hours. No results for input(s): AMMONIA in the last 168 hours. Coagulation Profile: Recent Labs  Lab 11/25/20 1055  INR 1.0   Cardiac Enzymes: No results for input(s): CKTOTAL, CKMB, CKMBINDEX, TROPONINI in the last 168 hours. BNP (last 3 results) No results for input(s): PROBNP in the last 8760 hours. HbA1C: Recent  Labs    11/25/20 1250  HGBA1C 9.8*   CBG: Recent Labs  Lab 11/26/20 2357 11/27/20 0108 11/27/20 0446 11/27/20 0646 11/27/20 0817  GLUCAP 205* 177* 144* 124* 106*   Lipid Profile: No results for input(s): CHOL, HDL, LDLCALC, TRIG, CHOLHDL, LDLDIRECT in the last 72 hours. Thyroid Function Tests: Recent Labs    11/25/20 1218  TSH 6.807*   Anemia Panel: No results for input(s): VITAMINB12, FOLATE, FERRITIN, TIBC, IRON, RETICCTPCT in the last 72 hours. Sepsis Labs: No results for input(s): PROCALCITON, LATICACIDVEN in the last 168 hours.  Recent Results (from the past 240 hour(s))  Resp Panel by RT-PCR (Flu A&B, Covid) Nasopharyngeal Swab     Status: None   Collection Time: 11/25/20 11:02 AM   Specimen: Nasopharyngeal Swab; Nasopharyngeal(NP) swabs in vial transport medium  Result Value Ref Range Status   SARS Coronavirus 2 by RT PCR NEGATIVE NEGATIVE Final    Comment: (NOTE) SARS-CoV-2 target nucleic acids are NOT DETECTED.  The SARS-CoV-2 RNA is generally detectable in upper respiratory specimens during the acute phase of infection. The lowest concentration of SARS-CoV-2 viral copies this  assay can detect is 138 copies/mL. A negative result does not preclude SARS-Cov-2 infection and should not be used as the sole basis for treatment or other patient management decisions. A negative result may occur with  improper specimen collection/handling, submission of specimen other than nasopharyngeal swab, presence of viral mutation(s) within the areas targeted by this assay, and inadequate number of viral copies(<138 copies/mL). A negative result must be combined with clinical observations, patient history, and epidemiological information. The expected result is Negative.  Fact Sheet for Patients:  EntrepreneurPulse.com.au  Fact Sheet for Healthcare Providers:  IncredibleEmployment.be  This test is no t yet approved or cleared by the [place] FDA and  has been authorized for detection and/or diagnosis of SARS-CoV-2 by FDA under an Emergency Use Authorization (EUA). This EUA will remain  in effect (meaning this test can be used) for the duration of the COVID-19 declaration under Section 564(b)(1) of the Act, 21 U.S.C.section 360bbb-3(b)(1), unless the authorization is terminated  or revoked sooner.       Influenza A by PCR NEGATIVE NEGATIVE Final   Influenza B by PCR NEGATIVE NEGATIVE Final    Comment: (NOTE) The Xpert Xpress SARS-CoV-2/FLU/RSV plus assay is intended as an aid in the diagnosis of influenza from Nasopharyngeal swab specimens and should not be used as a sole basis for treatment. Nasal washings and aspirates are unacceptable for Xpert Xpress SARS-CoV-2/FLU/RSV testing.  Fact Sheet for Patients: EntrepreneurPulse.com.au  Fact Sheet for Healthcare Providers: IncredibleEmployment.be  This test is not yet approved or cleared by the [place] FDA and has been authorized for detection and/or diagnosis of SARS-CoV-2 by FDA under an Emergency Use Authorization (EUA). This EUA will  remain in effect (meaning this test can be used) for the duration of the COVID-19 declaration under Section 564(b)(1) of the Act, 21 U.S.C. section 360bbb-3(b)(1), unless the authorization is terminated or revoked.  Performed at [facility], [place] [address] [place], [place] [address]      Radiology Studies: DG CHEST PORT 1 VIEW  Result Date: 11/26/2020 CLINICAL DATA:  Cough. EXAM: PORTABLE CHEST 1 VIEW COMPARISON:  Chest radiograph yesterday. FINDINGS: Patient is post median sternotomy and CABG. Mild cardiomegaly is stable. Unchanged elevation of right hemidiaphragm. No focal airspace disease. Mild interstitial coarsening which appears chronic. No pulmonary edema, large pleural effusion, or pneumothorax. No acute osseous abnormalities are  seen. Degenerative change of the left shoulder. IMPRESSION: 1. No acute abnormality or significant change from yesterday. 2. Stable mild cardiomegaly. Unchanged elevation of right hemidiaphragm. Electronically Signed   By: Keith Rake M.D.   On: 11/26/2020 15:30   ECHOCARDIOGRAM COMPLETE  Result Date: 11/25/2020    ECHOCARDIOGRAM REPORT   Patient Name:   Sandy Owens Date of Exam: 11/25/2020 Medical Rec #:  [id number]         Height:       61.0 in Accession #:    [id number]        Weight:       121.3 lb Date of Birth:  [birth year]         BSA:          1.527 m Patient Age:    89 years          BP:           137/54 mmHg Patient Gender: F                 HR:           70 bpm. Exam Location:  Inpatient Procedure: 2D Echo, 3D Echo, Color Doppler and Cardiac Doppler Indications:     Tachycardia [[id number]]  History:         Patient has prior history of Echocardiogram examinations, most                  recent 06/07/2020. CAD, Prior CABG; Risk Factors:Hypertension,                  Diabetes and Dyslipidemia. Hypotjyroidism. Past history of                  breast cancer.  Sonographer:     Darlina Sicilian RDCS Referring Phys:  [id number] [facility] J PATWARDHAN  Diagnosing Phys: Vernell Leep MD  Sonographer Comments: Global longitudinal strain was attempted. IMPRESSIONS  1. Left ventricular ejection fraction, by estimation, is 55 to 60%. The left ventricle has normal function. The left ventricle has no regional wall motion abnormalities. There is mild left ventricular hypertrophy. Left ventricular diastolic parameters are consistent with Grade I diastolic dysfunction (impaired relaxation).  2. Right ventricular systolic function is low normal. The right ventricular size is normal.  3. The mitral valve is grossly normal. Mild to moderate mitral valve regurgitation.  4. The aortic valve is tricuspid. Aortic valve regurgitation is not visualized. Moderate aortic valve stenosis. Aortic valve area, by VTI measures 1.31 cm. Aortic valve mean gradient measures 23.0 mmHg. Aortic valve Vmax measures 3.14 m/s.  5. There is borderline dilatation of the aortic root, measuring 37 mm.  6. The inferior vena cava is normal in size with greater than 50% respiratory variability, suggesting right atrial pressure of 3 mmHg.  7. No significant change compared to putpatient study on 06/07/2020. FINDINGS  Left Ventricle: Left ventricular ejection fraction, by estimation, is 55 to 60%. The left ventricle has normal function. The left ventricle has no regional wall motion abnormalities. The left ventricular internal cavity size was normal in size. There is  mild left ventricular hypertrophy. Left ventricular diastolic parameters are consistent with Grade I diastolic dysfunction (impaired relaxation). Right Ventricle: The right ventricular size is normal. No increase in right ventricular wall thickness. Right ventricular systolic function is low normal. Left Atrium: Left atrial size was normal in size. Right Atrium: Right atrial size was normal in size. Pericardium: There is no evidence of pericardial effusion.  Mitral Valve: The mitral valve is grossly normal. Mild to moderate mitral valve  regurgitation. MV peak gradient, 6.2 mmHg. The mean mitral valve gradient is 3.0 mmHg. Tricuspid Valve: The tricuspid valve is grossly normal. Tricuspid valve regurgitation is not demonstrated. Aortic Valve: The aortic valve is tricuspid. Aortic valve regurgitation is not visualized. Moderate aortic stenosis is present. Aortic valve mean gradient measures 23.0 mmHg. Aortic valve peak gradient measures 39.4 mmHg. Aortic valve area, by VTI measures 1.31 cm. Pulmonic Valve: The pulmonic valve was grossly normal. Pulmonic valve regurgitation is not visualized. Aorta: Aortic dilatation noted. There is borderline dilatation of the aortic root, measuring 37 mm. Venous: The inferior vena cava is normal in size with greater than 50% respiratory variability, suggesting right atrial pressure of 3 mmHg. IAS/Shunts: No atrial level shunt detected by color flow Doppler.  LEFT VENTRICLE PLAX 2D LVIDd:         5.00 cm  Diastology LVIDs:         2.90 cm  LV e' medial:    5.11 cm/s LV PW:         0.90 cm  LV E/e' medial:  19.0 LV IVS:        1.50 cm  LV e' lateral:   7.62 cm/s LVOT diam:     2.10 cm  LV E/e' lateral: 12.8 LV SV:         82 LV SV Index:   54 LVOT Area:     3.46 cm                          3D Volume EF:                         3D EF:        53 %                         LV EDV:       115 ml                         LV ESV:       54 ml                         LV SV:        61 ml RIGHT VENTRICLE TAPSE (M-mode): 1.1 cm LEFT ATRIUM             Index       RIGHT ATRIUM          Index LA diam:        3.70 cm 2.42 cm/m  RA Area:     8.39 cm LA Vol (A2C):   37.6 ml 24.63 ml/m RA Volume:   15.80 ml 10.35 ml/m LA Vol (A4C):   25.1 ml 16.44 ml/m LA Biplane Vol: 31.5 ml 20.63 ml/m  AORTIC VALVE AV Area (Vmax):    1.02 cm AV Area (Vmean):   1.14 cm AV Area (VTI):     1.31 cm AV Vmax:           314.00 cm/s AV Vmean:          224.000 cm/s AV VTI:            0.631 m AV Peak Grad:      39.4 mmHg AV Mean Grad:  23.0 mmHg  LVOT Vmax:         92.30 cm/s LVOT Vmean:        74.000 cm/s LVOT VTI:          0.238 m LVOT/AV VTI ratio: 0.38  AORTA Ao Root diam: 3.00 cm Ao Asc diam:  2.90 cm MITRAL VALVE MV Area (PHT): 3.42 cm     SHUNTS MV Area VTI:   2.27 cm     Systemic VTI:  0.24 m MV Peak grad:  6.2 mmHg     Systemic Diam: 2.10 cm MV Mean grad:  3.0 mmHg MV Vmax:       1.25 m/s MV Vmean:      73.5 cm/s MV Decel Time: 222 msec MV E velocity: 97.30 cm/s MV A velocity: 130.00 cm/s MV E/A ratio:  0.75 Manish Patwardhan MD Electronically signed by Vernell Leep MD Signature Date/Time: 11/25/2020/4:01:22 PM    Final      LOS: 1 day   Antonieta Pert, MD [facility]  11/27/2020, 11:09 AM

## 2020-11-27 NOTE — Progress Notes (Signed)
Subjective:  Feels better Still has labile blood sugars  Brief episdoe of Afib on telemetry  Objective:  Vital Signs in the last 24 hours: Temp:  [97.8 F (36.6 C)-99 F (37.2 C)] 97.8 F (36.6 C) (03/28 1143) Pulse Rate:  [68-109] 109 (03/28 1143) Resp:  [17-25] 20 (03/28 1143) BP: (123-175)/(53-66) 136/66 (03/28 1143) SpO2:  [94 %-100 %] 98 % (03/28 1143) Weight:  [60.7 kg] 60.7 kg (03/28 0441)  Intake/Output from previous day: 03/27 0701 - 03/28 0700 In: 420.5 [I.V.:420.5] Out: -   Physical Exam Vitals and nursing note reviewed.  Constitutional:      General: She is not in acute distress.    Appearance: She is well-developed.  HENT:     Head: Normocephalic and atraumatic.  Eyes:     Conjunctiva/sclera: Conjunctivae normal.     Pupils: Pupils are equal, round, and reactive to light.  Neck:     Vascular: No JVD.  Cardiovascular:     Rate and Rhythm: Normal rate and regular rhythm.     Pulses: Intact distal pulses.          Dorsalis pedis pulses are 1+ on the right side and 1+ on the left side.       Posterior tibial pulses are 0 on the right side and 0 on the left side.     Heart sounds: No murmur heard.   Pulmonary:     Effort: Pulmonary effort is normal.     Breath sounds: Normal breath sounds. No wheezing or rales.  Abdominal:     General: Bowel sounds are normal.     Palpations: Abdomen is soft.     Tenderness: There is no rebound.  Musculoskeletal:        General: No tenderness. Normal range of motion.     Left lower leg: No edema.  Lymphadenopathy:     Cervical: No cervical adenopathy.  Skin:    General: Skin is warm and dry.  Neurological:     Mental Status: She is alert and oriented to person, place, and time.     Cranial Nerves: No cranial nerve deficit.      Lab Results: BMP Recent Labs    10/26/20 1315 11/25/20 1055 11/26/20 0330 11/26/20 0736 11/27/20 0647  NA 134   < > 136 135 136  K 5.0   < > 3.5 3.5 3.6  CL 94*   < > 103 103  102  CO2 23   < > 27 26 28   GLUCOSE 213*   < > 129* 160* 124*  BUN 24   < > 19 17 15   CREATININE 1.01*   < > 0.86 0.93 1.03*  CALCIUM 9.6   < > 8.4* 8.1* 8.3*  GFRNONAA 47*   < > >60 57* 50*  GFRAA 55*  --   --   --   --    < > = values in this interval not displayed.    CBC Recent Labs  Lab 11/25/20 1250 11/26/20 0330 11/27/20 0647  WBC 9.3   < > 9.7  RBC 3.87   < > 4.21  HGB 10.5*   < > 11.1*  HCT 32.8*   < > 36.1  PLT 199   < > 219  MCV 84.8   < > 85.7  MCH 27.1   < > 26.4  MCHC 32.0   < > 30.7  RDW 18.5*   < > 18.8*  LYMPHSABS 1.4  --   --  MONOABS 0.5  --   --   EOSABS 0.0  --   --   BASOSABS 0.0  --   --    < > = values in this interval not displayed.    HEMOGLOBIN A1C Lab Results  Component Value Date   HGBA1C 9.8 (H) 11/25/2020   MPG 234.56 11/25/2020    Cardiac Panel (last 3 results) Results for DARNEISHA, WINDHORST (MRN [id number]) as of 11/26/2020 15:31  Ref. Range 11/25/2020 10:55 11/25/2020 12:50  Troponin I (High Sensitivity) Latest Ref Range: <18 ng/L 251 (HH) 1,236 (HH)   BNP (last 3 results) Recent Labs    10/26/20 1315  BNP 340.1*    TSH Recent Labs    06/06/20 1623 10/26/20 1315 11/25/20 1218  TSH 0.95 2.360 6.807*      Hepatic Function Panel Recent Labs    06/06/20 1623 11/25/20 1055 11/27/20 0647  PROT 7.4 5.9* 5.7*  ALBUMIN 3.8 2.4* 2.3*  AST 39* 59* 37  ALT 22 51* 39  ALKPHOS 92 126 113  BILITOT 0.4 0.8 0.4    Imaging: CXR 11/26/2020: Patient is post median sternotomy and CABG. Mild cardiomegaly is stable. Unchanged elevation of right hemidiaphragm. No focal airspace disease. Mild interstitial coarsening which appears chronic. No pulmonary edema, large pleural effusion, or pneumothorax. No acute osseous abnormalities are seen. Degenerative change of the left shoulder.  IMPRESSION: 1. No acute abnormality or significant change from yesterday. 2. Stable mild cardiomegaly. Unchanged elevation of  right hemidiaphragm.    CARDIAC STUDIES:  EKG 11/25/2020 11:20 AM: Sinus rhythm 70 bpm.  Right bundle branch block, left posterior fascicular block.  Minimal ST elevation in inferior leads, not STEMI   EKG 11/25/2020 10:48 AM: Supraventricular tachycardia, RBBB, LPFB Diffuse ST segment changes, consider ischemia  Echocardiogram pending:   Assessment/Plan  87y.o.Caucasianfemalewith hyperlipidemia, type 2 diabetes mellitus, peripheral neuropathy, CAD s/p CABG, h/o breast cancer, admitted with palpitations.  Tachycardia: Supraventricular tachycardia, most likely atrial tachycardia. Adenosine responsive.  Currently on IV amiodarone, switching back to amiodarone 200 mg tid.  Also one episode of Afib this morning. Conitnue amiodarone. CHA2DS2VASc score 4, annual stroke risk 5%. Started eliquis 5 mg bid.  Stopped heparin, Aspirin, plavix  Aortic stenosis: Moderate. Monitor.  Hyperglycemia: Still labile blood sugars. Patient's daughter is very worried about managing this at home. Needs more education. Likely medication induced due to prednisone, which she has been on 15 mg for last 3-4 weeks for back pain. BG >400 today. I have encouraged the daughter to speak with her PCP regarding alternate options for prednisone for outpatient management of diabetes. Appreciate medicine team input [id number] management.   Dementia: I encouraged patient's daughter to stay in the hospital with the patient.  Hopefully, this will avoid any hospital delirium. Continue donepezil.  Anticipated discharge: 11/28/2020   Nigel Mormon, MD Pager: [phone] Office: [phone]

## 2020-11-27 NOTE — Progress Notes (Signed)
Pt with limited venous access for phlebotomy blood draws secondary to left arm restriction and location of heparin infusion.  Pt and daughter at bedside currently refusing blood draws after 1 unsuccessful stick this am.  Requested to try again in 4 hours. Primary nurse, [place] and pharmacy notified.  Plan to continue IV heparin at current rate and d/c after 48 hours of therapy as per MD notes.

## 2020-11-27 NOTE — TOC Benefit Eligibility Note (Signed)
Patient Advocate Encounter  Insurance verification completed.    The patient is currently admitted and upon discharge could be taking Eliquis 5 mg.  The current 30 day co-pay is, $45.00.   The patient is insured through [employer] Medicare Part D     Jeffrey Smith, CPhT Pharmacy Patient Advocate Specialist [place] [facility] Stewardship Team Direct Number: [phone]  Fax: [phone]        

## 2020-11-27 NOTE — Progress Notes (Signed)
Cross-coverage note:   Patient complained of dysuria. No systemic signs of infection. Plan for UA and culture.

## 2020-11-28 ENCOUNTER — Other Ambulatory Visit: Payer: Self-pay | Admitting: Cardiology

## 2020-11-28 LAB — URINALYSIS, ROUTINE W REFLEX MICROSCOPIC
Bilirubin Urine: NEGATIVE
Glucose, UA: >=500 mg/dL — AB
Hgb urine dipstick: NEGATIVE
Ketones, ur: NEGATIVE mg/dL
Nitrite: POSITIVE — AB
Protein, ur: NEGATIVE mg/dL
Specific Gravity, Urine: 1.003 — ABNORMAL LOW (ref 1.005–1.030)
pH: 5 (ref 5.0–8.0)

## 2020-11-28 LAB — GLUCOSE, CAPILLARY
Glucose-Capillary: 108 mg/dL — ABNORMAL HIGH (ref 70–99)
Glucose-Capillary: 106 mg/dL — ABNORMAL HIGH (ref 70–99)
Glucose-Capillary: 238 mg/dL — ABNORMAL HIGH (ref 70–99)

## 2020-11-28 MED ORDER — CEPHALEXIN 500 MG PO CAPS: 500.0000 mg | ORAL_CAPSULE | Freq: Three times a day (TID) | ORAL | 0 refills | Status: DC

## 2020-11-28 MED ORDER — ATENOLOL 25 MG PO TABS: 25.0000 mg | ORAL_TABLET | Freq: Every day | ORAL | 1 refills | Status: DC

## 2020-11-28 MED ORDER — ATENOLOL 25 MG PO TABS
25.0000 mg | ORAL_TABLET | Freq: Every day | ORAL | Status: DC
  Administered 2020-11-28: 25 mg via ORAL
  Filled 2020-11-28: qty 1

## 2020-11-28 MED ORDER — CEPHALEXIN 500 MG PO CAPS: 500.0000 mg | ORAL_CAPSULE | Freq: Two times a day (BID) | ORAL | 0 refills | Status: AC

## 2020-11-28 MED ORDER — CEPHALEXIN 500 MG PO CAPS
500.0000 mg | ORAL_CAPSULE | Freq: Two times a day (BID) | ORAL | Status: DC
  Administered 2020-11-28: 500 mg via ORAL
  Filled 2020-11-28: qty 1

## 2020-11-28 MED FILL — ATENOLOL 25 MG TABLET: 25 | 15 days supply | Qty: 15 | Fill #0

## 2020-11-28 MED FILL — CEPHALEXIN 500 MG CAPS: 500 | 5 days supply | Qty: 10 | Fill #0

## 2020-11-28 NOTE — Progress Notes (Signed)
Inpatient Diabetes Program Recommendations  AACE/ADA: New Consensus Statement on Inpatient Glycemic Control (2015)  Target Ranges:  Prepandial:   less than 140 mg/dL      Peak postprandial:   less than 180 mg/dL (1-2 hours)      Critically ill patients:  140 - 180 mg/dL   Lab Results  Component Value Date   ZWCHEN 277 (H) 11/28/2020   HGBA1C 9.8 (H) 11/25/2020    Review of Glycemic Control  Diabetes history: DM2, New onset Outpatient Diabetes medications: None Current orders for Inpatient glycemic control: Levemir 8 units QD, Novolog 0-9 units Q4H   Inpatient Diabetes Program Recommendations:     Discussed insulin pen administration, hypoglyccemia s/s and treatment and monitoring blood sugars with pt and grandson. Grandson states pt's daughter was taught by RN how to inject insulin using a pen. Grandson states pt's daughter had no problem with giving pt her insulin 1x/day.  Discussed impact of nutrition, exercise, stress, sickness, and medications on diabetes control.  Stressed importance of f/u with PCP for diabetes management.   Discussed with RN.  Thank you. Lorenda Peck, RD, LDN, CDE Inpatient Diabetes Coordinator [phone]

## 2020-11-28 NOTE — Progress Notes (Signed)
Physical Therapy Treatment Patient Details Name: Sandy Owens [facility] MRN: [id number] DOB: 11/20/1923 Today's Date: 11/28/2020    History of Present Illness 87 y.o. female admitted 11/25/20 with palpitations and several episodes of tachycardia over the past few weeks; code STEMI initially called, but ultimately cancelled. Workup for supraventricular tachycardia requiring IV adenosine; troponin elevation likely demand ischemia. PMH includes CAD (s/p CABG 2001), DM, HTN, neuropathy, L breast CA, L TKA, R THA, dementia.    PT Comments    Patient progressing slowly towards PT goals. Improved ambulation distance with Min guard assist and use of RW for support. Reports feeling weak in BUEs gripping RW. HR 90s-135 bpm max during activity. Pt reports some "wooziness" in her head during mobility. BP stable throughout. Pt has family support at home and would recommend supervision for OOB mobility initially when returning home for a few days until strength, endurance and mobility improve. Grandson asking for counseling on how to administer insulin and DM management prior to d/c, RN made aware. Will follow.    Follow Up Recommendations  Home health PT;Supervision for mobility/OOB     Equipment Recommendations  None recommended by PT    Recommendations for Other Services       Precautions / Restrictions Precautions Precautions: Fall;Other (comment) Precaution Comments: watch HR Restrictions Weight Bearing Restrictions: No    Mobility  Bed Mobility Overal bed mobility: Needs Assistance Bed Mobility: Supine to Sit;Sit to Supine     Supine to sit: Supervision;HOB elevated Sit to supine: Supervision;HOB elevated   General bed mobility comments: Increased time but no assist needed to come to EOB, use of rail. mild dizziness reported. BP stable.    Transfers Overall transfer level: Needs assistance Equipment used: Rolling walker (2 wheeled) Transfers: Sit to/from Stand Sit to Stand: Min  guard         General transfer comment: Min guard for safety, pulling up on RW to rise.  Ambulation/Gait Ambulation/Gait assistance: Min guard Gait Distance (Feet): 60 Feet Assistive device: Rolling walker (2 wheeled) Gait Pattern/deviations: Step-through pattern;Decreased stride length;Trunk flexed Gait velocity: Decreased   General Gait Details: Slow, steady gait with RW and min guard for balance; pt reports UEs feeling weak and want to give out. HR stable in 115 bpm, however jumped up to 135 bpm once returning to supine.   Stairs             Wheelchair Mobility    Modified Rankin (Stroke Patients Only)       Balance Overall balance assessment: Needs assistance Sitting-balance support: Feet unsupported;No upper extremity supported Sitting balance-Leahy Scale: Fair Sitting balance - Comments: supervision for safety.   Standing balance support: During functional activity Standing balance-Leahy Scale: Poor Standing balance comment: Reliant on UE support                            Cognition Arousal/Alertness: Awake/alert Behavior During Therapy: WFL for tasks assessed/performed Overall Cognitive Status: History of cognitive impairments - at baseline                                 General Comments: Pt endorses baseline STM memory deficits, otherwise [facility] for simple tasks.      Exercises      General Comments General comments (skin integrity, edema, etc.): BP stable throughout. HR 90s-135 bpm max during activity, reports some "wooziness" in her head.  Pertinent Vitals/Pain Pain Assessment: No/denies pain    Home Living                      Prior Function            PT Goals (current goals can now be found in the care plan section) Progress towards PT goals: Progressing toward goals    Frequency    Min 3X/week      PT Plan Current plan remains appropriate    Co-evaluation              AM-PAC PT  "6 Clicks" Mobility   Outcome Measure  Help needed turning from your back to your side while in a flat bed without using bedrails?: A Little Help needed moving from lying on your back to sitting on the side of a flat bed without using bedrails?: A Little Help needed moving to and from a bed to a chair (including a wheelchair)?: A Little Help needed standing up from a chair using your arms (e.g., wheelchair or bedside chair)?: A Little Help needed to walk in hospital room?: A Little Help needed climbing 3-5 steps with a railing? : A Little 6 Click Score: 18    End of Session Equipment Utilized During Treatment: Gait belt Activity Tolerance: Patient tolerated treatment well;Treatment limited secondary to medical complications (Comment) (tachycardia) Patient left: in bed;with call bell/phone within reach;with bed alarm set;with family/visitor present Nurse Communication: Mobility status;Other (comment) (grandson wanted education on how to help pt with insulin/diabetes management.) PT Visit Diagnosis: Other abnormalities of gait and mobility (R26.89);Muscle weakness (generalized) (M62.81)     Time: 1037-1100 PT Time Calculation (min) (ACUTE ONLY): 23 min  Charges:  $Gait Training: 8-22 mins $Therapeutic Activity: 8-22 mins                     Marisa Severin, PT, DPT Acute Rehabilitation Services Pager [phone] Office [place] 11/28/2020, 12:28 PM

## 2020-11-28 NOTE — Discharge Summary (Signed)
Physician Discharge Summary  Patient ID: Sandy Owens [facility] MRN: [id number] DOB/AGE: 12-06-1923 87 y.o.  Admit date: 11/25/2020 Discharge date: 11/28/2020  Primary Discharge Diagnosis: Supraventricular tachycardia Atrial tachycardia Paroxysmal atrial fibrillation Moderate aortic stenosis Uncontrolled type 2 DM UTI  Secondary Discharge Diagnosis: Hypertension Dementia   Hospital Course:    87y.o.Caucasianfemalewith hyperlipidemia, type 2 diabetes mellitus, peripheral neuropathy, CAD s/p CABG,h/o breast cancer, admitted withpalpitations.  Patient had episode of SVT-most likely atrial tachycardia, converted with adenosine. Patient had at least two more episodes throughout the hospital stay, again adenosine responsive. I curbsided EP, who recommended amiodarone. I used IV and PO amiodarone with improvement in frequency of her episodes. On 11/27/2020, she had brief episode of Afib, with few more recurrences of paroxysmal Afib w/RVR on 11/28/2020. Thus, I added atenolol 25 mg daily. She is not symptomatic with these episodes of paroxysmal Afib. Management will remain the same-inpatient or outpatient. We mutually agreed that discharge was still likely the best course. Given new onset Afib, I started her on eliquis 2.5 mg bid.   Patient had elevation of troponin, likely demand ischemia. I initially treated with Aspirin, plavix, and heparin. However, EF is normal, there is no wall motion abnormality. With initiation of eliquis for Afib, I stopped Aspirin and plavix on discharge. I started high intensity statin.  Patient had significantly elevated BG on admission, in 500s. This is likely induced by prednisone 15 mg, which she is on for back pain. BG was managed by medicine consult team. Lantus 8 U was recommended on discharge. Daughter was educated regarding insulin administration.   On 11/27/2020, she complained of dysuria. UA/UCx showed UTI. She was prescribed PO Keflex 500 mg bid for 5  days.   I obtained [facility] consult, and arrange for home health RN, PT, aide.   Patient was deemed full code for the hospital course given ongoing management for SVT and need to administer adenosine. She will be DNR again on discharge.      Discharge Exam: Blood pressure 131/61, pulse 82, temperature 98.3 F (36.8 C), temperature source Oral, resp. rate (!) 22, height 5' 1"  (1.549 m), weight 60.7 kg, SpO2 96 %.   Physical Exam Vitals and nursing note reviewed.  Constitutional:      General: She is not in acute distress.    Appearance: She is well-developed.  HENT:     Head: Normocephalic and atraumatic.  Eyes:     Conjunctiva/sclera: Conjunctivae normal.     Pupils: Pupils are equal, round, and reactive to light.  Neck:     Vascular: No JVD.  Cardiovascular:     Rate and Rhythm: Tachycardia present. Rhythm irregular.     Pulses: Normal pulses and intact distal pulses.     Heart sounds: Murmur heard.   Harsh midsystolic murmur is present with a grade of 3/6 at the upper right sternal border radiating to the neck.   Pulmonary:     Effort: Pulmonary effort is normal.     Breath sounds: Normal breath sounds. No wheezing or rales.  Abdominal:     General: Bowel sounds are normal.     Palpations: Abdomen is soft.     Tenderness: There is no rebound.  Musculoskeletal:        General: No tenderness. Normal range of motion.     Right lower leg: No edema.     Left lower leg: No edema.  Lymphadenopathy:     Cervical: No cervical adenopathy.  Skin:    General: Skin is  warm and dry.  Neurological:     Mental Status: She is alert and oriented to person, place, and time.     Cranial Nerves: No cranial nerve deficit.       Significant Diagnostic Studies:  EKG 11/28/2020: Sinus rhythm. RBBB, LAFB Inferior infarct, age indeterminate  EKG 11/25/2020 11:20 AM: Sinus rhythm 70 bpm. Right bundle branch block, left posterior fascicular block. Minimal ST elevation in inferior  leads, not STEMI   EKG 11/25/2020 10:48 AM: Supraventricular tachycardia, RBBB, LPFB Diffuse ST segment changes, consider ischemia  1. Left ventricular ejection fraction, by estimation, is 55 to 60%. The  left ventricle has normal function. The left ventricle has no regional  wall motion abnormalities. There is mild left ventricular hypertrophy.  Left ventricular diastolic parameters  are consistent with Grade I diastolic dysfunction (impaired relaxation).  2. Right ventricular systolic function is low normal. The right  ventricular size is normal.  3. The mitral valve is grossly normal. Mild to moderate mitral valve  regurgitation.  4. The aortic valve is tricuspid. Aortic valve regurgitation is not  visualized. Moderate aortic valve stenosis. Aortic valve area, by VTI  measures 1.31 cm. Aortic valve mean gradient measures 23.0 mmHg. Aortic  valve Vmax measures 3.14 m/s.  5. There is borderline dilatation of the aortic root, measuring 37 mm.  6. The inferior vena cava is normal in size with greater than 50%  respiratory variability, suggesting right atrial pressure of 3 mmHg.  7. No significant change compared to putpatient study on 06/07/2020.    Labs:   Lab Results  Component Value Date   WBC 9.7 11/27/2020   HGB 11.1 (L) 11/27/2020   HCT 36.1 11/27/2020   MCV 85.7 11/27/2020   PLT 219 11/27/2020    Recent Labs  Lab 11/27/20 0647  NA 136  K 3.6  CL 102  CO2 28  BUN 15  CREATININE 1.03*  CALCIUM 8.3*  PROT 5.7*  BILITOT 0.4  ALKPHOS 113  ALT 39  AST 37  GLUCOSE 124*     BNP (last 3 results) Recent Labs    10/26/20 1315  BNP 340.1*    HEMOGLOBIN A1C Lab Results  Component Value Date   HGBA1C 9.8 (H) 11/25/2020   MPG 234.56 11/25/2020    Cardiac Panel (last 3 results) Results for Sandy Owens, Sandy Owens (MRN [id number]) as of 11/28/2020 11:30  Ref. Range 11/25/2020 10:55 11/25/2020 12:50  Troponin I (High Sensitivity) Latest Ref Range: <18  ng/L 251 (HH) 1,236 (HH)    TSH Recent Labs    06/06/20 1623 10/26/20 1315 11/25/20 1218  TSH 0.95 2.360 6.807*    Radiology: DG CHEST PORT 1 VIEW  Result Date: 11/26/2020 CLINICAL DATA:  Cough. EXAM: PORTABLE CHEST 1 VIEW COMPARISON:  Chest radiograph yesterday. FINDINGS: Patient is post median sternotomy and CABG. Mild cardiomegaly is stable. Unchanged elevation of right hemidiaphragm. No focal airspace disease. Mild interstitial coarsening which appears chronic. No pulmonary edema, large pleural effusion, or pneumothorax. No acute osseous abnormalities are seen. Degenerative change of the left shoulder. IMPRESSION: 1. No acute abnormality or significant change from yesterday. 2. Stable mild cardiomegaly. Unchanged elevation of right hemidiaphragm. Electronically Signed   By: Keith Rake M.D.   On: 11/26/2020 15:30   DG Chest Port 1 View  Result Date: 11/25/2020 CLINICAL DATA:  Rapid heart rate. EXAM: PORTABLE CHEST 1 VIEW COMPARISON:  10/26/2020 FINDINGS: Previous median sternotomy and CABG procedure. Aortic atherosclerosis. Asymmetric elevation of right hemidiaphragm. No interstitial edema or  airspace opacities. The bones appear diffusely osteopenic. Degenerative changes are noted at the left glenohumeral joint. IMPRESSION: No acute abnormality. Aortic Atherosclerosis (ICD10-I70.0). Electronically Signed   By: Kerby Moors M.D.   On: 11/25/2020 11:11   ECHOCARDIOGRAM COMPLETE  Result Date: 11/25/2020    ECHOCARDIOGRAM REPORT   Patient Name:   Sandy Owens Date of Exam: 11/25/2020 Medical Rec #:  [id number]         Height:       61.0 in Accession #:    [id number]        Weight:       121.3 lb Date of Birth:  [birth year]         BSA:          1.527 m Patient Age:    89 years          BP:           137/54 mmHg Patient Gender: F                 HR:           70 bpm. Exam Location:  Inpatient Procedure: 2D Echo, 3D Echo, Color Doppler and Cardiac Doppler Indications:     Tachycardia  [[id number]]  History:         Patient has prior history of Echocardiogram examinations, most                  recent 06/07/2020. CAD, Prior CABG; Risk Factors:Hypertension,                  Diabetes and Dyslipidemia. Hypotjyroidism. Past history of                  breast cancer.  Sonographer:     Darlina Sicilian RDCS Referring Phys:  [id number] [facility] J Cloa Bushong Diagnosing Phys: Vernell Leep MD  Sonographer Comments: Global longitudinal strain was attempted. IMPRESSIONS  1. Left ventricular ejection fraction, by estimation, is 55 to 60%. The left ventricle has normal function. The left ventricle has no regional wall motion abnormalities. There is mild left ventricular hypertrophy. Left ventricular diastolic parameters are consistent with Grade I diastolic dysfunction (impaired relaxation).  2. Right ventricular systolic function is low normal. The right ventricular size is normal.  3. The mitral valve is grossly normal. Mild to moderate mitral valve regurgitation.  4. The aortic valve is tricuspid. Aortic valve regurgitation is not visualized. Moderate aortic valve stenosis. Aortic valve area, by VTI measures 1.31 cm. Aortic valve mean gradient measures 23.0 mmHg. Aortic valve Vmax measures 3.14 m/s.  5. There is borderline dilatation of the aortic root, measuring 37 mm.  6. The inferior vena cava is normal in size with greater than 50% respiratory variability, suggesting right atrial pressure of 3 mmHg.  7. No significant change compared to putpatient study on 06/07/2020. FINDINGS  Left Ventricle: Left ventricular ejection fraction, by estimation, is 55 to 60%. The left ventricle has normal function. The left ventricle has no regional wall motion abnormalities. The left ventricular internal cavity size was normal in size. There is  mild left ventricular hypertrophy. Left ventricular diastolic parameters are consistent with Grade I diastolic dysfunction (impaired relaxation). Right Ventricle: The right ventricular  size is normal. No increase in right ventricular wall thickness. Right ventricular systolic function is low normal. Left Atrium: Left atrial size was normal in size. Right Atrium: Right atrial size was normal in size. Pericardium: There is no evidence of pericardial effusion. Mitral Valve:  The mitral valve is grossly normal. Mild to moderate mitral valve regurgitation. MV peak gradient, 6.2 mmHg. The mean mitral valve gradient is 3.0 mmHg. Tricuspid Valve: The tricuspid valve is grossly normal. Tricuspid valve regurgitation is not demonstrated. Aortic Valve: The aortic valve is tricuspid. Aortic valve regurgitation is not visualized. Moderate aortic stenosis is present. Aortic valve mean gradient measures 23.0 mmHg. Aortic valve peak gradient measures 39.4 mmHg. Aortic valve area, by VTI measures 1.31 cm. Pulmonic Valve: The pulmonic valve was grossly normal. Pulmonic valve regurgitation is not visualized. Aorta: Aortic dilatation noted. There is borderline dilatation of the aortic root, measuring 37 mm. Venous: The inferior vena cava is normal in size with greater than 50% respiratory variability, suggesting right atrial pressure of 3 mmHg. IAS/Shunts: No atrial level shunt detected by color flow Doppler.  LEFT VENTRICLE PLAX 2D LVIDd:         5.00 cm  Diastology LVIDs:         2.90 cm  LV e' medial:    5.11 cm/s LV PW:         0.90 cm  LV E/e' medial:  19.0 LV IVS:        1.50 cm  LV e' lateral:   7.62 cm/s LVOT diam:     2.10 cm  LV E/e' lateral: 12.8 LV SV:         82 LV SV Index:   54 LVOT Area:     3.46 cm                          3D Volume EF:                         3D EF:        53 %                         LV EDV:       115 ml                         LV ESV:       54 ml                         LV SV:        61 ml RIGHT VENTRICLE TAPSE (M-mode): 1.1 cm LEFT ATRIUM             Index       RIGHT ATRIUM          Index LA diam:        3.70 cm 2.42 cm/m  RA Area:     8.39 cm LA Vol (A2C):   37.6 ml 24.63  ml/m RA Volume:   15.80 ml 10.35 ml/m LA Vol (A4C):   25.1 ml 16.44 ml/m LA Biplane Vol: 31.5 ml 20.63 ml/m  AORTIC VALVE AV Area (Vmax):    1.02 cm AV Area (Vmean):   1.14 cm AV Area (VTI):     1.31 cm AV Vmax:           314.00 cm/s AV Vmean:          224.000 cm/s AV VTI:            0.631 m AV Peak Grad:      39.4 mmHg AV Mean Grad:  23.0 mmHg LVOT Vmax:         92.30 cm/s LVOT Vmean:        74.000 cm/s LVOT VTI:          0.238 m LVOT/AV VTI ratio: 0.38  AORTA Ao Root diam: 3.00 cm Ao Asc diam:  2.90 cm MITRAL VALVE MV Area (PHT): 3.42 cm     SHUNTS MV Area VTI:   2.27 cm     Systemic VTI:  0.24 m MV Peak grad:  6.2 mmHg     Systemic Diam: 2.10 cm MV Mean grad:  3.0 mmHg MV Vmax:       1.25 m/s MV Vmean:      73.5 cm/s MV Decel Time: 222 msec MV E velocity: 97.30 cm/s MV A velocity: 130.00 cm/s MV E/A ratio:  0.75 Sandy Wingert MD Electronically signed by Vernell Leep MD Signature Date/Time: 11/25/2020/4:01:22 PM    Final    LONG TERM MONITOR-LIVE TELEMETRY (3-14 DAYS)  Result Date: 11/22/2020 Mobile cardiac telemetry 6 days 11/10/2020 - 11/17/2020: Dominant rhythm: Sinus. HR 47-93 bpm. Avg HR 61 bpm, while in sinus rhythm with intermittent bundle branch block. 8 episodes of SVT, fastest at 148 bpm for 6 beats, longest for 5 hours 51 min at 125 bpm (11/12/2020). 5% isolated SVE, <1 couplet/triplets. <1% isolated VE, couplets. No atrial fibrillation/atrial flutter/VT/high grade AV block, sinus pause >3sec noted. 2 patient triggered events, correlated with SVT.     FOLLOW UP PLANS AND APPOINTMENTS Discharge Instructions    Diet - low sodium heart healthy   Complete by: As directed    Face-to-face encounter (required for Medicare/Medicaid patients)   Complete by: As directed    I Benedict certify that this patient is under my care and that I, or a nurse practitioner or physician's assistant working with me, had a face-to-face encounter that meets the physician face-to-face  encounter requirements with this patient on 11/27/2020. The encounter with the patient was in whole, or in part for the following medical condition(s) which is the primary reason for home health care (List medical condition): Afib, atrial tachycardia, deconditioning, dementia   The encounter with the patient was in whole, or in part, for the following medical condition, which is the primary reason for home health care: Afib, atrial tachycardia, deconditioning, dementia   I certify that, based on my findings, the following services are medically necessary home health services:  Nursing Physical therapy     Reason for Medically Necessary Home Health Services: Therapy- Home Adaptation to Facilitate Safety   My clinical findings support the need for the above services: Cognitive impairments, dementia, or mental confusion  that make it unsafe to leave home   Further, I certify that my clinical findings support that this patient is homebound due to: Shortness of Breath with activity   Home Health   Complete by: As directed    To provide the following care/treatments:  PT OT Home Health Aide     Increase activity slowly   Complete by: As directed      Allergies as of 11/28/2020      Reactions   Tape Other (See Comments)   TAPE TEARS AND BRUISES THE SKIN VERY EASILY- PATIENT HAS VERY THIN SKIN!!   Lisinopril Hives   Statins Other (See Comments)   Muscle pain      Medication List    STOP taking these medications   ivabradine 5 MG Tabs tablet Commonly known as: Corlanor  TAKE these medications   amiodarone 200 MG tablet Commonly known as: PACERONE Take 1 tablet (200 mg total) by mouth 3 (three) times daily.   amLODipine 5 MG tablet Commonly known as: NORVASC TAKE 1 TABLET(5 MG) BY MOUTH DAILY What changed:   how much to take  how to take this  when to take this  additional instructions   apixaban 2.5 MG Tabs tablet Commonly known as: ELIQUIS Take 1 tablet (2.5 mg total)  by mouth 2 (two) times daily.   artificial tears ointment Place 1 application into both eyes as needed (dry eyes).   ARTIFICIAL TEARS PF OP Place 1 drop into both eyes 3 (three) times daily as needed (for dryness).   aspirin 81 MG tablet Take 81 mg by mouth daily with supper.   atenolol 25 MG tablet Commonly known as: TENORMIN Take 1 tablet (25 mg total) by mouth daily.   blood glucose meter kit and supplies Kit Dispense based on patient and insurance preference. Use up to four times daily as directed.   CALCIUM+D3 PO Take 1 tablet by mouth daily with breakfast.   cephALEXin 500 MG capsule Commonly known as: KEFLEX Take 1 capsule (500 mg total) by mouth every 8 (eight) hours for 5 days.   CINNAMON PO Take 2,000 mg by mouth daily.   donepezil 5 MG tablet Commonly known as: ARICEPT Take 1 tablet (5 mg total) by mouth at bedtime.   FISH OIL PO Take 1,400 mg by mouth 2 (two) times daily.   insulin glargine 100 unit/mL Sopn Commonly known as: LANTUS Inject 8 Units into the skin daily.   levothyroxine 88 MCG tablet Commonly known as: SYNTHROID TAKE 1 TABLET(88 MCG) BY MOUTH DAILY What changed:   how much to take  how to take this  when to take this  additional instructions   NON FORMULARY Take 1 capsule by mouth See admin instructions. [facility], Memory and nerve support capsules- Take 2 capsules by mouth once a day   predniSONE 5 MG tablet Commonly known as: DELTASONE Take 3 tablets (15 mg total) by mouth daily with breakfast.   PRESERVISION AREDS 2 PO Take 1 capsule by mouth in the morning and at bedtime.   rosuvastatin 10 MG tablet Commonly known as: CRESTOR Take 1 tablet (10 mg total) by mouth daily.   Vitamin D3 50 MCG (2000 UT) capsule Take 1 capsule (2,000 Units total) by mouth daily.            Durable Medical Equipment  (From admission, onward)         Start     Ordered   11/27/20 1037  For home use only DME 3 n 1  Once         11/27/20 1037          Follow-up Information    Alizza Sacra, Reynold Bowen, MD Follow up on 12/01/2020.   Specialties: Cardiology, Radiology Why: 8:30 AM  Contact information: [place] [address] [phone]        Care, [facility] Follow up.   Specialty: [place] Why: A representative from [facility] will contact you to arrange start date and time for your services. Contact information: [address] [place]  [address] [phone]                 Nigel Mormon, MD Pager: [phone] Office: [phone]

## 2020-11-28 NOTE — Telephone Encounter (Signed)
Does patient need to be double booked? Or with another provider? Dr. Camila Li doesn't have anything until late next week and dont know how I should proceed

## 2020-11-28 NOTE — Plan of Care (Incomplete)
Goals met 

## 2020-11-28 NOTE — Progress Notes (Signed)
PROGRESS NOTE    Shakiya Mcneary [facility]  FFM:[id number] DOB: 31-Jul-1924 DOA: 11/25/2020 PCP: [name], MD   Chief Complaint  Patient presents with  . Code STEMI  Brief Narrative: 87 year old female with CAD, CABG history, HTN, T2DM with peripheral neuropathy, hypothyroidism, history of subarachnoid hemorrhage, CKD stage III, history of breast cancer, HLD, chronic back pain, cognitive impairment admitted with atrial tachycardia.  In the ED patient received education and converted to sinus rhythm, was admitted under cardiology service and found to have uncontrolled hyperglycemia and TRH consulted for blood sugar management.  Subjective: Seen this morning no new complaints family at the bedside. Overnight had abnormal UA concerning for UTI, patient having urinary symptoms  Assessment & Plan:  Uncontrolled hyperglycemia with new onset diabetes with recent prednisone use for few weeks.  HbA1c- at 9.8. Initially blood sugar was up to 500 needing insulin drip.Blood sugar is stabilized on Lantus further decrease dose to 4 units today.Will need home insulin DM coordinator consulted nursing staff has educated about insulin check and insulin injection to family.  Blood sugar overall fluctuating 10 6-3 10.  Keep on Lantus 8 units may need to further titrate the dose but she will follow-up with her PCP. Will need close follow-up with PCP versus endocrinology as outpatient. Discussed with patient's family nurse at the bedside in detail.  May need to switch to oral regimen on outpatient basis. Recent Labs  Lab 11/27/20 2044 11/27/20 2350 11/28/20 0420 11/28/20 0757 11/28/20 1124  GLUCAP 310* 253* 108* 106* 238*   Tachycardia/SVT: Per cardiology continue with amiodarone/further plan  Abnormal UA concerned about UTI has dysuria, culture pending but okay to discharge on oral Keflex based on previous culture sensitivity with pansensitive E. Coli  Hypertension Hyperlipidemia CAD status post  CABG Elevated troponin Blood pressure control, continue current regimen,  amlodipine, Crestor, switched to Eliquis- plan  Per cardio  Moderate aortic stenosis: per cardio  Chronic back pain on prednisone 15 mg for few weeks, will need to taper slowly.  Continue current dose-and follow-up with PCP and as you taper down the steroid please also consider tapering off insulin and switching to p.o.  Dementia: Continue fall precaution supportive care Hypothyroidism continue her Synthroid  Diet Order            Diet - low sodium heart healthy           Diet - low sodium heart healthy           Diet Carb Modified Fluid consistency: Thin; Room service appropriate? Yes  Diet effective now               Patient's Body mass index is 25.28 kg/m. DVT prophylaxis: apixaban (ELIQUIS) tablet 2.5 mg Start: 11/27/20 1015 SCDs Start: 11/25/20 1212 Code Status:   Code Status: Full Code  Family Communication: plan of care discussed with patient and her family at bedside. Patient is being discharged home today  Status is: Inpatient Remains inpatient appropriate because:Inpatient level of care appropriate due to severity of illness    Unresulted Labs (From admission, onward)          Start     Ordered   11/27/20 2344  Culture, Urine  Once,   R        11/27/20 2343   11/25/20 1101  CBC with Differential/Platelet  Once,   STAT        11/25/20 1101         Medications reviewed:  Scheduled Meds: .  amiodarone  200 mg Oral TID  . amLODipine  5 mg Oral Daily  . apixaban  2.5 mg Oral BID  . atenolol  25 mg Oral Daily  . cephALEXin  500 mg Oral Q12H  . donepezil  5 mg Oral QHS  . insulin aspart  0-9 Units Subcutaneous Q4H  . insulin detemir  8 Units Subcutaneous Daily  . levothyroxine  88 mcg Oral Q0600  . predniSONE  15 mg Oral Q breakfast  . rosuvastatin  10 mg Oral Daily   Continuous Infusions:  Consultants:see note  Procedures:see note  Antimicrobials: Anti-infectives (From  admission, onward)   Start     Dose/Rate Route Frequency Ordered Stop   11/28/20 1015  cephALEXin (KEFLEX) capsule 500 mg        500 mg Oral Every 12 hours 11/28/20 0924     11/28/20 0000  cephALEXin (KEFLEX) 500 MG capsule  Status:  Discontinued        500 mg Oral Every 8 hours 11/28/20 0925 11/28/20    11/28/20 0000  cephALEXin (KEFLEX) 500 MG capsule        500 mg Oral 2 times daily 11/28/20 1035 12/03/20 2359     Culture/Microbiology    Component Value Date/Time   SDES URINE, CATHETERIZED 11/28/2017 1430   SPECREQUEST  11/28/2017 1430    Normal Performed at [facility], [place] [address], [place], [place] [address]    CULT >=100,000 COLONIES/mL ESCHERICHIA COLI (A) 11/28/2017 1430   REPTSTATUS 11/30/2017 FINAL 11/28/2017 1430    Other culture-see note  Objective: Vitals: Today's Vitals   11/27/20 2000 11/27/20 2340 11/27/20 2345 11/28/20 0345  BP: (!) 144/74  (!) 152/61 131/61  Pulse: 91  81 82  Resp: (!) 22  (!) 22 (!) 22  Temp: 97.8 F (36.6 C)  98 F (36.7 C) 98.3 F (36.8 C)  TempSrc: Oral  Oral Oral  SpO2: 99%  95% 96%  Weight:      Height:      PainSc: 0-No pain 2       Intake/Output Summary (Last 24 hours) at 11/28/2020 1316 Last data filed at 11/27/2020 1657 Gross per 24 hour  Intake --  Output 500 ml  Net -500 ml   Filed Weights   11/25/20 1300 11/26/20 0400 11/27/20 0441  Weight: 55 kg 58.5 kg 60.7 kg   Weight change:   Intake/Output from previous day: 03/28 0701 - 03/29 0700 In: -  Out: 500 [Urine:500] Intake/Output this shift: No intake/output data recorded. Filed Weights   11/25/20 1300 11/26/20 0400 11/27/20 0441  Weight: 55 kg 58.5 kg 60.7 kg    Examination: General exam: AAOx2, demented, elderly, frail, NAD, weak appearing. HEENT:Oral mucosa moist, Ear/Nose WNL grossly, dentition normal. Respiratory system: bilaterally clear,no wheezing or crackles,no use of accessory muscle Cardiovascular system: S1 & S2 +, No  JVD,. Gastrointestinal system: Abdomen soft, NT,ND, BS+ Nervous System:Alert, awake, moving extremities and grossly nonfocal Extremities: No edema, distal peripheral pulses palpable.  Skin: No rashes,no icterus. MSK: Normal muscle bulk,tone, power  Data Reviewed: I have personally reviewed following labs and imaging studies CBC: Recent Labs  Lab 11/25/20 1250 11/26/20 0330 11/27/20 0647  WBC 9.3 9.7 9.7  NEUTROABS 7.1  --   --   HGB 10.5* 10.5* 11.1*  HCT 32.8* 33.5* 36.1  MCV 84.8 84.4 85.7  PLT 199 195 678   Basic Metabolic Panel: Recent Labs  Lab 11/25/20 1913 11/25/20 2159 11/26/20 0330 11/26/20 0736 11/27/20 9381  NA 127* 129* 136 135 136  K 4.8 4.2 3.5 3.5 3.6  CL 94* 95* 103 103 102  CO2 20* 24 27 26 28   GLUCOSE 513* 391* 129* 160* 124*  BUN 23 22 19 17 15   CREATININE 1.18* 1.05* 0.86 0.93 1.03*  CALCIUM 8.4* 8.5* 8.4* 8.1* 8.3*   GFR: Estimated Creatinine Clearance: 27.3 mL/min (A) (by C-G formula based on SCr of 1.03 mg/dL (H)). Liver Function Tests: Recent Labs  Lab 11/25/20 1055 11/27/20 0647  AST 59* 37  ALT 51* 39  ALKPHOS 126 113  BILITOT 0.8 0.4  PROT 5.9* 5.7*  ALBUMIN 2.4* 2.3*   No results for input(s): LIPASE, AMYLASE in the last 168 hours. No results for input(s): AMMONIA in the last 168 hours. Coagulation Profile: Recent Labs  Lab 11/25/20 1055  INR 1.0   Cardiac Enzymes: No results for input(s): CKTOTAL, CKMB, CKMBINDEX, TROPONINI in the last 168 hours. BNP (last 3 results) No results for input(s): PROBNP in the last 8760 hours. HbA1C: No results for input(s): HGBA1C in the last 72 hours. CBG: Recent Labs  Lab 11/27/20 2044 11/27/20 2350 11/28/20 0420 11/28/20 0757 11/28/20 1124  GLUCAP 310* 253* 108* 106* 238*   Lipid Profile: No results for input(s): CHOL, HDL, LDLCALC, TRIG, CHOLHDL, LDLDIRECT in the last 72 hours. Thyroid Function Tests: No results for input(s): TSH, T4TOTAL, FREET4, T3FREE, THYROIDAB in the last  72 hours. Anemia Panel: No results for input(s): VITAMINB12, FOLATE, FERRITIN, TIBC, IRON, RETICCTPCT in the last 72 hours. Sepsis Labs: No results for input(s): PROCALCITON, LATICACIDVEN in the last 168 hours.  Recent Results (from the past 240 hour(s))  Resp Panel by RT-PCR (Flu A&B, Covid) Nasopharyngeal Swab     Status: None   Collection Time: 11/25/20 11:02 AM   Specimen: Nasopharyngeal Swab; Nasopharyngeal(NP) swabs in vial transport medium  Result Value Ref Range Status   SARS Coronavirus 2 by RT PCR NEGATIVE NEGATIVE Final    Comment: (NOTE) SARS-CoV-2 target nucleic acids are NOT DETECTED.  The SARS-CoV-2 RNA is generally detectable in upper respiratory specimens during the acute phase of infection. The lowest concentration of SARS-CoV-2 viral copies this assay can detect is 138 copies/mL. A negative result does not preclude SARS-Cov-2 infection and should not be used as the sole basis for treatment or other patient management decisions. A negative result may occur with  improper specimen collection/handling, submission of specimen other than nasopharyngeal swab, presence of viral mutation(s) within the areas targeted by this assay, and inadequate number of viral copies(<138 copies/mL). A negative result must be combined with clinical observations, patient history, and epidemiological information. The expected result is Negative.  Fact Sheet for Patients:  EntrepreneurPulse.com.au  Fact Sheet for Healthcare Providers:  IncredibleEmployment.be  This test is no t yet approved or cleared by the [place] FDA and  has been authorized for detection and/or diagnosis of SARS-CoV-2 by FDA under an Emergency Use Authorization (EUA). This EUA will remain  in effect (meaning this test can be used) for the duration of the COVID-19 declaration under Section 564(b)(1) of the Act, 21 U.S.C.section 360bbb-3(b)(1), unless the authorization is  terminated  or revoked sooner.       Influenza A by PCR NEGATIVE NEGATIVE Final   Influenza B by PCR NEGATIVE NEGATIVE Final    Comment: (NOTE) The Xpert Xpress SARS-CoV-2/FLU/RSV plus assay is intended as an aid in the diagnosis of influenza from Nasopharyngeal swab specimens and should not be used as a sole basis for treatment.  Nasal washings and aspirates are unacceptable for Xpert Xpress SARS-CoV-2/FLU/RSV testing.  Fact Sheet for Patients: EntrepreneurPulse.com.au  Fact Sheet for Healthcare Providers: IncredibleEmployment.be  This test is not yet approved or cleared by the [place] FDA and has been authorized for detection and/or diagnosis of SARS-CoV-2 by FDA under an Emergency Use Authorization (EUA). This EUA will remain in effect (meaning this test can be used) for the duration of the COVID-19 declaration under Section 564(b)(1) of the Act, 21 U.S.C. section 360bbb-3(b)(1), unless the authorization is terminated or revoked.  Performed at [facility], [place] [address], [place], [place] [address]      Radiology Studies: DG CHEST PORT 1 VIEW  Result Date: 11/26/2020 CLINICAL DATA:  Cough. EXAM: PORTABLE CHEST 1 VIEW COMPARISON:  Chest radiograph yesterday. FINDINGS: Patient is post median sternotomy and CABG. Mild cardiomegaly is stable. Unchanged elevation of right hemidiaphragm. No focal airspace disease. Mild interstitial coarsening which appears chronic. No pulmonary edema, large pleural effusion, or pneumothorax. No acute osseous abnormalities are seen. Degenerative change of the left shoulder. IMPRESSION: 1. No acute abnormality or significant change from yesterday. 2. Stable mild cardiomegaly. Unchanged elevation of right hemidiaphragm. Electronically Signed   By: Keith Rake M.D.   On: 11/26/2020 15:30     LOS: 2 days   Antonieta Pert, MD [facility]  11/28/2020, 1:16 PM

## 2020-11-28 NOTE — Discharge Instructions (Signed)

## 2020-11-28 NOTE — Progress Notes (Signed)
PHARMACY NOTE:  ANTIMICROBIAL RENAL DOSAGE ADJUSTMENT  Current antimicrobial regimen includes a mismatch between antimicrobial dosage and estimated renal function.  As per policy approved by the Pharmacy & Therapeutics and Medical Executive Committees, the antimicrobial dosage will be adjusted accordingly.  Current antimicrobial dosage:  Cephalexin 500 Q8h   Indication: UTI  Renal Function:  Estimated Creatinine Clearance: 27.3 mL/min (A) (by C-G formula based on SCr of 1.03 mg/dL (H)).       Antimicrobial dosage has been changed to:  500mg  Q12hr    Thank you for allowing pharmacy to be a part of this patient's care.  Benetta Spar, PharmD, BCPS, BCCP Clinical Pharmacist  Please check AMION for all [place] phone numbers After 10:00 PM, call [place] [phone]

## 2020-11-28 NOTE — Progress Notes (Signed)
   11/27/20 2340  Provider Notification  Provider Name/Title Oypd  Date Provider Notified 11/27/20  Time Provider Notified 2340  Notification Type Page  Notification Reason Requested by patient/family (patient complains of dysuria, daughter requested to see if provider would check patient for UTI, patient urine foul smell)  Provider response See new orders  Date of Provider Response 11/27/20  Time of Provider Response [id number]

## 2020-11-29 ENCOUNTER — Telehealth: Payer: Self-pay

## 2020-11-29 ENCOUNTER — Telehealth: Payer: Medicare HMO | Admitting: Cardiology

## 2020-11-29 NOTE — Telephone Encounter (Signed)
-----   Message from Nigel Mormon, MD sent at 11/28/2020 11:33 AM EDT ----- Regarding: F/u & TOC Discharge follow up: TOC: Needed (Please call on 3/30) Follow up appt: Scheduled (Staff, please note that this can be done virtually, Please check with patient's daughter) Discharge diagnosis: PAF, atrial tachycardia Discharge date: 11/28/20  Thanks MJP

## 2020-11-29 NOTE — Telephone Encounter (Signed)
Pt's Corlanor was denied

## 2020-11-29 NOTE — Telephone Encounter (Signed)
Does not need it anymore. You may cancel the prescription.  Thanks MJP

## 2020-11-29 NOTE — Telephone Encounter (Signed)
Location of hospitalization: [facility] Reason for hospitalization: Rapid heart beat Date of discharge: 11/29/2020 Date of first communication with patient: today Person contacting patient: Gaye Alken, CMA Current symptoms: none Do you understand why you were in the Hospital: Yes Questions regarding discharge instructions: None Where were you discharged to: Home Medications reviewed: Yes Allergies reviewed: Yes Dietary changes reviewed: Yes. Discussed low fat and low salt diet.  Referals reviewed: NA Activities of Daily Living: Able to with mild limitations Any transportation issues/concerns: None Any patient concerns: None Confirmed importance & date/time of Follow up appt: Yes, confirmed with daughter Sandy Owens. Confirmed with patient if condition begins to worsen call. Pt was given the office number and encouraged to call back with questions or concerns: Yes

## 2020-11-30 LAB — URINE CULTURE: Culture: >=100000 — AB

## 2020-11-30 NOTE — Progress Notes (Signed)
Follow up visit  Subjective:   Sandy Owens, female    DOB: 05-14-1924, 87 y.o.   MRN: [id number]     HPI   Chief Complaint  Patient presents with  . SVT  . Hospitalization Follow-up    87y.o.Caucasianfemalewith hyperlipidemia, type 2 diabetes mellitus, peripheral neuropathy, CAD s/p CABG,h/o breast cancer, admitted withpalpitations.  Discharge summary 11/28/2020: Patient had episode of SVT-most likely atrial tachycardia, converted with adenosine. Patient had at least two more episodes throughout the hospital stay, again adenosine responsive. I curbsided EP, who recommended amiodarone. I used IV and PO amiodarone with improvement in frequency of her episodes. On 11/27/2020, she had brief episode of Afib, with few more recurrences of paroxysmal Afib w/RVR on 11/28/2020. Thus, I added atenolol 25 mg daily. She is not symptomatic with these episodes of paroxysmal Afib. Management will remain the same-inpatient or outpatient. We mutually agreed that discharge was still likely the best course. Given new onset Afib, I started her on eliquis 2.5 mg bid.   Patient had elevation of troponin, likely demand ischemia. I initially treated with Aspirin, plavix, and heparin. However, EF is normal, there is no wall motion abnormality. With initiation of eliquis for Afib, I stopped Aspirin and plavix on discharge. I started high intensity statin.  Patient had significantly elevated BG on admission, in 500s. This is likely induced by prednisone 15 mg, which she is on for back pain. BG was managed by medicine consult team. Lantus 8 U was recommended on discharge. Daughter was educated regarding insulin administration.   On 11/27/2020, she complained of dysuria. UA/UCx showed UTI. She was prescribed PO Keflex 500 mg bid for 5 days.   I obtained [facility] consult, and arrange for home health RN, PT, aide.   Patient was deemed full code for the hospital course given ongoing management for SVT  and need to administer adenosine. She will be DNR again on discharge.   Patient is here for transition care visit with her daughter and son.  She has generalized weakness since her hospitalization.  However, denies any palpitations, shortness of breath.  She has home health notes and physical therapy evaluation scheduled for later today.  She is compliant with her medical therapy.  Blood pressures well controlled.  She denies any bleeding issues.  She denies any fever, chills, dysuria symptoms.  Her son is administering her insulin.  Blood sugars running around 250.    Current Outpatient Medications on File Prior to Visit  Medication Sig Dispense Refill  . amiodarone (PACERONE) 200 MG tablet Take 1 tablet (200 mg total) by mouth 3 (three) times daily. 60 tablet 1  . amLODipine (NORVASC) 5 MG tablet TAKE 1 TABLET(5 MG) BY MOUTH DAILY (Patient taking differently: Take 5 mg by mouth daily.) 90 tablet 3  . apixaban (ELIQUIS) 2.5 MG TABS tablet Take 1 tablet (2.5 mg total) by mouth 2 (two) times daily. 60 tablet 1  . Artificial Tear Ointment (ARTIFICIAL TEARS) ointment Place 1 application into both eyes as needed (dry eyes).    Marland Kitchen aspirin 81 MG tablet Take 81 mg by mouth daily with supper.    Marland Kitchen atenolol (TENORMIN) 25 MG tablet Take 1 tablet (25 mg total) by mouth daily. 15 tablet 1  . blood glucose meter kit and supplies KIT Dispense based on patient and insurance preference. Use up to four times daily as directed. 1 each 0  . Calcium Carb-Cholecalciferol (CALCIUM+D3 PO) Take 1 tablet by mouth daily with breakfast.    .  cephALEXin (KEFLEX) 500 MG capsule Take 1 capsule (500 mg total) by mouth 2 (two) times daily for 5 days. 10 capsule 0  . Cholecalciferol (VITAMIN D3) 50 MCG (2000 UT) capsule Take 1 capsule (2,000 Units total) by mouth daily. 100 capsule 3  . CINNAMON PO Take 2,000 mg by mouth daily.    Marland Kitchen Dextran 70-Hypromellose (ARTIFICIAL TEARS PF OP) Place 1 drop into both eyes 3 (three) times  daily as needed (for dryness).    Marland Kitchen donepezil (ARICEPT) 5 MG tablet Take 1 tablet (5 mg total) by mouth at bedtime. 90 tablet 3  . insulin glargine (LANTUS) 100 unit/mL SOPN Inject 8 Units into the skin daily. 2.4 mL 0  . levothyroxine (SYNTHROID) 88 MCG tablet TAKE 1 TABLET(88 MCG) BY MOUTH DAILY (Patient taking differently: Take 88 mcg by mouth daily before breakfast.) 90 tablet 3  . Multiple Vitamins-Minerals (PRESERVISION AREDS 2 PO) Take 1 capsule by mouth in the morning and at bedtime.    . NON FORMULARY Take 1 capsule by mouth See admin instructions. [facility], Memory and nerve support capsules- Take 2 capsules by mouth once a day    . Omega-3 Fatty Acids (FISH OIL PO) Take 1,400 mg by mouth 2 (two) times daily.     . predniSONE (DELTASONE) 5 MG tablet Take 3 tablets (15 mg total) by mouth daily with breakfast. (Patient taking differently: Take 12.5 mg by mouth daily with breakfast.) 90 tablet 5  . rosuvastatin (CRESTOR) 10 MG tablet Take 1 tablet (10 mg total) by mouth daily. 30 tablet 1   No current facility-administered medications on file prior to visit.    Cardiovascular & other pertient studies:  EKG 12/01/2020: Sinus rhythm 67 bpm Right bundle branch block  Old inferior infarct Nonspecific T-abnormality   Recent labs: 11/27/2020: Glucose 124, BUN/Cr 15/1.03. EGFR 50. Na/K 136/3.6. Albumin 2.3. Rest of the CMP normal H/H 11/36. MCV 85. Platelets 219 HbA1C 9.8% TSH 6.8    Review of Systems  Constitutional: Positive for malaise/fatigue.  Cardiovascular: Negative for chest pain, dyspnea on exertion, leg swelling, palpitations and syncope.         Vitals:   12/01/20 0836  BP: (!) 145/59  Pulse: 67  SpO2: 96%     Body mass index is 24.94 kg/m. Filed Weights   12/01/20 0836  Weight: 132 lb (59.9 kg)     Objective:   Physical Exam Vitals and nursing note reviewed.  Constitutional:      General: She is not in acute distress. Neck:     Vascular: No JVD.   Cardiovascular:     Rate and Rhythm: Normal rate and regular rhythm.     Heart sounds: Normal heart sounds. No murmur heard.   Pulmonary:     Effort: Pulmonary effort is normal.     Breath sounds: Normal breath sounds. No wheezing or rales.  Musculoskeletal:     Right lower leg: No edema.     Left lower leg: No edema.           Assessment & Recommendations:   87y.o.Caucasianfemalewith hyperlipidemia, type 2 diabetes mellitus, peripheral neuropathy, h/o breast cancer, CAD s/p CABG,recurrent PAT and PAF, UTI 10/2020 (E.coli),  PAT, PAF: Continue amiodarone, reduce to 200 mg twice daily for 1 more week, then reduce to 200 mg daily.   She will need follow-up TSH and CMP in 4 weeks.  She would prefer doing this through her PCP.  I will request Dr. Alain Marion to follow-up on this.  Continue atenolol 25 mg daily. CHA2DS2VASc score 5, annual stroke risk 7%. Continue eliquis 2.5 mg bid  CAD: H/o CABG. Recent type 2 MI in the setting of PAT, PAF. Given ongoing use of eliquis and bleeding risk. Not on antiplatelet agents Patient does not want to pursue any invasive management at this time.   Aortic stenosis: Mild.  Repeat echocardiogram in 1 year  Type 2 diabetes mellitus: Currently on prednisone 50 mg daily for back pain.  Started on Lantus insulin during recent hospitalization.  Blood sugar at home is running around 250.  Follow-up with Dr. Alain Marion for further adjustment of insulin dosage.  UTI: E.coli, Currently on 5-day course of PO Keflex.  No current dysuria symptoms. Follow-up with Dr. Alain Marion.  Hypertension: Well-controlled  Follow-up with me in 6 weeks.   Nigel Mormon, MD Pager: [phone] Office: [phone]

## 2020-11-30 NOTE — Telephone Encounter (Signed)
I was out of the office yesterday, is there a time today?

## 2020-11-30 NOTE — Telephone Encounter (Signed)
Called patient and made appointment for 11:40 on 04.04.22

## 2020-12-01 ENCOUNTER — Encounter: Payer: Self-pay | Admitting: Cardiology

## 2020-12-01 ENCOUNTER — Ambulatory Visit: Payer: Medicare HMO | Admitting: Cardiology

## 2020-12-01 ENCOUNTER — Other Ambulatory Visit: Payer: Self-pay

## 2020-12-01 VITALS — BP 145/59 | HR 67 | Ht 61.0 in | Wt 132.0 lb

## 2020-12-01 DIAGNOSIS — E1142 Type 2 diabetes mellitus with diabetic polyneuropathy: Secondary | ICD-10-CM | POA: Diagnosis not present

## 2020-12-01 DIAGNOSIS — I471 Supraventricular tachycardia: Secondary | ICD-10-CM

## 2020-12-01 DIAGNOSIS — I10 Essential (primary) hypertension: Secondary | ICD-10-CM

## 2020-12-01 DIAGNOSIS — I080 Rheumatic disorders of both mitral and aortic valves: Secondary | ICD-10-CM | POA: Diagnosis not present

## 2020-12-01 DIAGNOSIS — N390 Urinary tract infection, site not specified: Secondary | ICD-10-CM | POA: Insufficient documentation

## 2020-12-01 DIAGNOSIS — I445 Left posterior fascicular block: Secondary | ICD-10-CM | POA: Diagnosis not present

## 2020-12-01 DIAGNOSIS — I480 Paroxysmal atrial fibrillation: Secondary | ICD-10-CM | POA: Diagnosis not present

## 2020-12-01 DIAGNOSIS — E119 Type 2 diabetes mellitus without complications: Secondary | ICD-10-CM | POA: Diagnosis not present

## 2020-12-01 DIAGNOSIS — I472 Ventricular tachycardia: Secondary | ICD-10-CM | POA: Diagnosis not present

## 2020-12-01 DIAGNOSIS — I350 Nonrheumatic aortic (valve) stenosis: Secondary | ICD-10-CM

## 2020-12-01 DIAGNOSIS — Z794 Long term (current) use of insulin: Secondary | ICD-10-CM | POA: Diagnosis not present

## 2020-12-01 DIAGNOSIS — F0390 Unspecified dementia without behavioral disturbance: Secondary | ICD-10-CM | POA: Diagnosis not present

## 2020-12-01 DIAGNOSIS — I2510 Atherosclerotic heart disease of native coronary artery without angina pectoris: Secondary | ICD-10-CM | POA: Diagnosis not present

## 2020-12-01 DIAGNOSIS — I119 Hypertensive heart disease without heart failure: Secondary | ICD-10-CM | POA: Diagnosis not present

## 2020-12-01 MED ORDER — ATENOLOL 25 MG PO TABS: 25.0000 mg | ORAL_TABLET | Freq: Every day | ORAL | 3 refills | Status: DC

## 2020-12-01 MED ORDER — APIXABAN 2.5 MG PO TABS: 2.5000 mg | ORAL_TABLET | Freq: Two times a day (BID) | ORAL | 3 refills | Status: AC

## 2020-12-01 MED ORDER — AMIODARONE HCL 200 MG PO TABS: 200.0000 mg | ORAL_TABLET | Freq: Every day | ORAL | 3 refills | Status: DC

## 2020-12-02 DIAGNOSIS — I10 Essential (primary) hypertension: Secondary | ICD-10-CM | POA: Diagnosis not present

## 2020-12-04 ENCOUNTER — Telehealth (INDEPENDENT_AMBULATORY_CARE_PROVIDER_SITE_OTHER): Payer: Medicare HMO | Admitting: Internal Medicine

## 2020-12-04 ENCOUNTER — Encounter: Payer: Self-pay | Admitting: Internal Medicine

## 2020-12-04 DIAGNOSIS — I445 Left posterior fascicular block: Secondary | ICD-10-CM | POA: Diagnosis not present

## 2020-12-04 DIAGNOSIS — N390 Urinary tract infection, site not specified: Secondary | ICD-10-CM | POA: Diagnosis not present

## 2020-12-04 DIAGNOSIS — M2550 Pain in unspecified joint: Secondary | ICD-10-CM | POA: Diagnosis not present

## 2020-12-04 DIAGNOSIS — I480 Paroxysmal atrial fibrillation: Secondary | ICD-10-CM

## 2020-12-04 DIAGNOSIS — E119 Type 2 diabetes mellitus without complications: Secondary | ICD-10-CM

## 2020-12-04 DIAGNOSIS — I119 Hypertensive heart disease without heart failure: Secondary | ICD-10-CM | POA: Diagnosis not present

## 2020-12-04 DIAGNOSIS — I2510 Atherosclerotic heart disease of native coronary artery without angina pectoris: Secondary | ICD-10-CM | POA: Diagnosis not present

## 2020-12-04 DIAGNOSIS — Z794 Long term (current) use of insulin: Secondary | ICD-10-CM

## 2020-12-04 DIAGNOSIS — I080 Rheumatic disorders of both mitral and aortic valves: Secondary | ICD-10-CM | POA: Diagnosis not present

## 2020-12-04 DIAGNOSIS — E1142 Type 2 diabetes mellitus with diabetic polyneuropathy: Secondary | ICD-10-CM | POA: Diagnosis not present

## 2020-12-04 DIAGNOSIS — I472 Ventricular tachycardia: Secondary | ICD-10-CM | POA: Diagnosis not present

## 2020-12-04 DIAGNOSIS — F0390 Unspecified dementia without behavioral disturbance: Secondary | ICD-10-CM | POA: Diagnosis not present

## 2020-12-04 MED ORDER — PREDNISONE 5 MG PO TABS: 10.0000 mg | ORAL_TABLET | Freq: Every day | ORAL | 0 refills | Status: DC

## 2020-12-04 MED ORDER — AMIODARONE HCL 200 MG PO TABS: 200.0000 mg | ORAL_TABLET | Freq: Two times a day (BID) | ORAL | 1 refills | Status: DC

## 2020-12-04 NOTE — Assessment & Plan Note (Signed)
Currently on amiodarone.  Heart rate is normal.  On Eliquis

## 2020-12-04 NOTE — Assessment & Plan Note (Signed)
Finish Keflex

## 2020-12-04 NOTE — Assessment & Plan Note (Signed)
Diabetes exacerbated by steroids and UTI.  Currently on Lantus 8 units daily.  Continue with Lantus 8 units daily subcu and reduce prednisone to 10 mg a day.  We will try to taper down prednisone later follow-up in 2 weeks

## 2020-12-04 NOTE — Progress Notes (Signed)
Virtual Visit via Video Note  I connected with Sandy Owens on 12/04/20 at 11:40 AM EDT by a video enabled telemedicine application and verified that I am speaking with the correct person using two identifiers.   I discussed the limitations of evaluation and management by telemedicine and the availability of in person appointments. The patient expressed understanding and agreed to proceed.  I was located at our [facility] office. The patient was at home. Sandy Owens (daughter) was present in the visit.   History of Present Illness:   Sandy Owens presents for A fib, UTI, DM, UTI.  Follow-up on her her recent hospital stay Per d/c summary:  "87y.o.Caucasianfemalewith hyperlipidemia, type 2 diabetes mellitus, peripheral neuropathy, CAD s/p CABG,h/o breast cancer, admitted withpalpitations.  Patient had episode of SVT-most likely atrial tachycardia, converted with adenosine. Patient had at least two more episodes throughout the hospital stay, again adenosine responsive. I curbsided EP, who recommended amiodarone. I used IV and PO amiodarone with improvement in frequency of her episodes. On 11/27/2020, she had brief episode of Afib, with few more recurrences of paroxysmal Afib w/RVR on 11/28/2020. Thus, I added atenolol 25 mg daily. She is not symptomatic with these episodes of paroxysmal Afib. Management will remain the same-inpatient or outpatient. We mutually agreed that discharge was still likely the best course. Given new onset Afib, I started her on eliquis 2.5 mg bid.   Patient had elevation of troponin, likely demand ischemia. I initially treated with Aspirin, plavix, and heparin. However, EF is normal, there is no wall motion abnormality. With initiation of eliquis for Afib, I stopped Aspirin and plavix on discharge. I started high intensity statin.  Patient had significantly elevated BG on admission, in 500s. This is likely induced by prednisone 15 mg, which  she is on for back pain. BG was managed by medicine consult team. Lantus 8 U was recommended on discharge. Daughter was educated regarding insulin administration.   On 11/27/2020, she complained of dysuria. UA/UCx showed UTI. She was prescribed PO Keflex 500 mg bid for 5 days.   I obtained [facility] consult, and arrange for home health RN, PT, aide.   Patient was deemed full code for the hospital course given ongoing management for SVT and need to administer adenosine. She will be DNR again on discharge. "  Blood pressure today 134/66.  Heart rate 67.  CBGs vary from 152-290.    Observations/Objective: The patient appears to be in no acute distress  Assessment and Plan:  See my Assessment and Plan. Follow Up Instructions:    I discussed the assessment and treatment plan with the patient. The patient was provided an opportunity to ask questions and all were answered. The patient agreed with the plan and demonstrated an understanding of the instructions.   The patient was advised to call back or seek an in-person evaluation if the symptoms worsen or if the condition fails to improve as anticipated.  I provided face-to-face time during this encounter. We were at different locations.   Walker Kehr, MD

## 2020-12-04 NOTE — Assessment & Plan Note (Signed)
Much better on steroids.  We will try to reduce to the lowest dose possible.  At present reduce prednisone to 10 mg a day.  Would ideally plan to reduce it down to 5 mg a day.  Continue with Lantus

## 2020-12-05 DIAGNOSIS — I080 Rheumatic disorders of both mitral and aortic valves: Secondary | ICD-10-CM | POA: Diagnosis not present

## 2020-12-05 DIAGNOSIS — N390 Urinary tract infection, site not specified: Secondary | ICD-10-CM | POA: Diagnosis not present

## 2020-12-05 DIAGNOSIS — F0390 Unspecified dementia without behavioral disturbance: Secondary | ICD-10-CM | POA: Diagnosis not present

## 2020-12-05 DIAGNOSIS — I445 Left posterior fascicular block: Secondary | ICD-10-CM | POA: Diagnosis not present

## 2020-12-05 DIAGNOSIS — E1142 Type 2 diabetes mellitus with diabetic polyneuropathy: Secondary | ICD-10-CM | POA: Diagnosis not present

## 2020-12-05 DIAGNOSIS — I480 Paroxysmal atrial fibrillation: Secondary | ICD-10-CM | POA: Diagnosis not present

## 2020-12-05 DIAGNOSIS — I119 Hypertensive heart disease without heart failure: Secondary | ICD-10-CM | POA: Diagnosis not present

## 2020-12-05 DIAGNOSIS — I2510 Atherosclerotic heart disease of native coronary artery without angina pectoris: Secondary | ICD-10-CM | POA: Diagnosis not present

## 2020-12-05 DIAGNOSIS — I472 Ventricular tachycardia: Secondary | ICD-10-CM | POA: Diagnosis not present

## 2020-12-07 DIAGNOSIS — I2510 Atherosclerotic heart disease of native coronary artery without angina pectoris: Secondary | ICD-10-CM | POA: Diagnosis not present

## 2020-12-07 DIAGNOSIS — I119 Hypertensive heart disease without heart failure: Secondary | ICD-10-CM | POA: Diagnosis not present

## 2020-12-07 DIAGNOSIS — I480 Paroxysmal atrial fibrillation: Secondary | ICD-10-CM | POA: Diagnosis not present

## 2020-12-07 DIAGNOSIS — I080 Rheumatic disorders of both mitral and aortic valves: Secondary | ICD-10-CM | POA: Diagnosis not present

## 2020-12-07 DIAGNOSIS — F0390 Unspecified dementia without behavioral disturbance: Secondary | ICD-10-CM | POA: Diagnosis not present

## 2020-12-07 DIAGNOSIS — I445 Left posterior fascicular block: Secondary | ICD-10-CM | POA: Diagnosis not present

## 2020-12-07 DIAGNOSIS — I472 Ventricular tachycardia: Secondary | ICD-10-CM | POA: Diagnosis not present

## 2020-12-07 DIAGNOSIS — N390 Urinary tract infection, site not specified: Secondary | ICD-10-CM | POA: Diagnosis not present

## 2020-12-07 DIAGNOSIS — E1142 Type 2 diabetes mellitus with diabetic polyneuropathy: Secondary | ICD-10-CM | POA: Diagnosis not present

## 2020-12-08 DIAGNOSIS — I2510 Atherosclerotic heart disease of native coronary artery without angina pectoris: Secondary | ICD-10-CM | POA: Diagnosis not present

## 2020-12-08 DIAGNOSIS — I080 Rheumatic disorders of both mitral and aortic valves: Secondary | ICD-10-CM | POA: Diagnosis not present

## 2020-12-08 DIAGNOSIS — F0390 Unspecified dementia without behavioral disturbance: Secondary | ICD-10-CM | POA: Diagnosis not present

## 2020-12-08 DIAGNOSIS — I119 Hypertensive heart disease without heart failure: Secondary | ICD-10-CM | POA: Diagnosis not present

## 2020-12-08 DIAGNOSIS — N390 Urinary tract infection, site not specified: Secondary | ICD-10-CM | POA: Diagnosis not present

## 2020-12-08 DIAGNOSIS — E1142 Type 2 diabetes mellitus with diabetic polyneuropathy: Secondary | ICD-10-CM | POA: Diagnosis not present

## 2020-12-08 DIAGNOSIS — I480 Paroxysmal atrial fibrillation: Secondary | ICD-10-CM | POA: Diagnosis not present

## 2020-12-08 DIAGNOSIS — I445 Left posterior fascicular block: Secondary | ICD-10-CM | POA: Diagnosis not present

## 2020-12-08 DIAGNOSIS — I472 Ventricular tachycardia: Secondary | ICD-10-CM | POA: Diagnosis not present

## 2020-12-11 DIAGNOSIS — I2510 Atherosclerotic heart disease of native coronary artery without angina pectoris: Secondary | ICD-10-CM | POA: Diagnosis not present

## 2020-12-11 DIAGNOSIS — I472 Ventricular tachycardia: Secondary | ICD-10-CM | POA: Diagnosis not present

## 2020-12-11 DIAGNOSIS — I445 Left posterior fascicular block: Secondary | ICD-10-CM | POA: Diagnosis not present

## 2020-12-11 DIAGNOSIS — I480 Paroxysmal atrial fibrillation: Secondary | ICD-10-CM | POA: Diagnosis not present

## 2020-12-11 DIAGNOSIS — I080 Rheumatic disorders of both mitral and aortic valves: Secondary | ICD-10-CM | POA: Diagnosis not present

## 2020-12-11 DIAGNOSIS — F0390 Unspecified dementia without behavioral disturbance: Secondary | ICD-10-CM | POA: Diagnosis not present

## 2020-12-11 DIAGNOSIS — N390 Urinary tract infection, site not specified: Secondary | ICD-10-CM | POA: Diagnosis not present

## 2020-12-11 DIAGNOSIS — E1142 Type 2 diabetes mellitus with diabetic polyneuropathy: Secondary | ICD-10-CM | POA: Diagnosis not present

## 2020-12-11 DIAGNOSIS — I119 Hypertensive heart disease without heart failure: Secondary | ICD-10-CM | POA: Diagnosis not present

## 2020-12-12 DIAGNOSIS — F0390 Unspecified dementia without behavioral disturbance: Secondary | ICD-10-CM | POA: Diagnosis not present

## 2020-12-12 DIAGNOSIS — I080 Rheumatic disorders of both mitral and aortic valves: Secondary | ICD-10-CM | POA: Diagnosis not present

## 2020-12-12 DIAGNOSIS — I4510 Unspecified right bundle-branch block: Secondary | ICD-10-CM

## 2020-12-12 DIAGNOSIS — M8580 Other specified disorders of bone density and structure, unspecified site: Secondary | ICD-10-CM

## 2020-12-12 DIAGNOSIS — I2510 Atherosclerotic heart disease of native coronary artery without angina pectoris: Secondary | ICD-10-CM | POA: Diagnosis not present

## 2020-12-12 DIAGNOSIS — E1142 Type 2 diabetes mellitus with diabetic polyneuropathy: Secondary | ICD-10-CM | POA: Diagnosis not present

## 2020-12-12 DIAGNOSIS — I119 Hypertensive heart disease without heart failure: Secondary | ICD-10-CM | POA: Diagnosis not present

## 2020-12-12 DIAGNOSIS — Z794 Long term (current) use of insulin: Secondary | ICD-10-CM

## 2020-12-12 DIAGNOSIS — Z951 Presence of aortocoronary bypass graft: Secondary | ICD-10-CM

## 2020-12-12 DIAGNOSIS — I445 Left posterior fascicular block: Secondary | ICD-10-CM | POA: Diagnosis not present

## 2020-12-12 DIAGNOSIS — I472 Ventricular tachycardia: Secondary | ICD-10-CM | POA: Diagnosis not present

## 2020-12-12 DIAGNOSIS — E785 Hyperlipidemia, unspecified: Secondary | ICD-10-CM

## 2020-12-12 DIAGNOSIS — Z9181 History of falling: Secondary | ICD-10-CM

## 2020-12-12 DIAGNOSIS — Z7901 Long term (current) use of anticoagulants: Secondary | ICD-10-CM

## 2020-12-12 DIAGNOSIS — I480 Paroxysmal atrial fibrillation: Secondary | ICD-10-CM | POA: Diagnosis not present

## 2020-12-12 DIAGNOSIS — M47816 Spondylosis without myelopathy or radiculopathy, lumbar region: Secondary | ICD-10-CM

## 2020-12-12 DIAGNOSIS — N390 Urinary tract infection, site not specified: Secondary | ICD-10-CM | POA: Diagnosis not present

## 2020-12-12 DIAGNOSIS — Z853 Personal history of malignant neoplasm of breast: Secondary | ICD-10-CM

## 2020-12-13 DIAGNOSIS — I472 Ventricular tachycardia: Secondary | ICD-10-CM | POA: Diagnosis not present

## 2020-12-13 DIAGNOSIS — N390 Urinary tract infection, site not specified: Secondary | ICD-10-CM | POA: Diagnosis not present

## 2020-12-13 DIAGNOSIS — I080 Rheumatic disorders of both mitral and aortic valves: Secondary | ICD-10-CM | POA: Diagnosis not present

## 2020-12-13 DIAGNOSIS — E1142 Type 2 diabetes mellitus with diabetic polyneuropathy: Secondary | ICD-10-CM | POA: Diagnosis not present

## 2020-12-13 DIAGNOSIS — I480 Paroxysmal atrial fibrillation: Secondary | ICD-10-CM | POA: Diagnosis not present

## 2020-12-13 DIAGNOSIS — F0390 Unspecified dementia without behavioral disturbance: Secondary | ICD-10-CM | POA: Diagnosis not present

## 2020-12-13 DIAGNOSIS — I445 Left posterior fascicular block: Secondary | ICD-10-CM | POA: Diagnosis not present

## 2020-12-13 DIAGNOSIS — I119 Hypertensive heart disease without heart failure: Secondary | ICD-10-CM | POA: Diagnosis not present

## 2020-12-13 DIAGNOSIS — I2510 Atherosclerotic heart disease of native coronary artery without angina pectoris: Secondary | ICD-10-CM | POA: Diagnosis not present

## 2020-12-14 ENCOUNTER — Telehealth: Payer: Self-pay | Admitting: Internal Medicine

## 2020-12-14 ENCOUNTER — Encounter: Payer: Self-pay | Admitting: Internal Medicine

## 2020-12-14 ENCOUNTER — Telehealth (INDEPENDENT_AMBULATORY_CARE_PROVIDER_SITE_OTHER): Payer: Medicare HMO | Admitting: Internal Medicine

## 2020-12-14 DIAGNOSIS — F0390 Unspecified dementia without behavioral disturbance: Secondary | ICD-10-CM | POA: Diagnosis not present

## 2020-12-14 DIAGNOSIS — I445 Left posterior fascicular block: Secondary | ICD-10-CM | POA: Diagnosis not present

## 2020-12-14 DIAGNOSIS — E669 Obesity, unspecified: Secondary | ICD-10-CM

## 2020-12-14 DIAGNOSIS — N390 Urinary tract infection, site not specified: Secondary | ICD-10-CM | POA: Diagnosis not present

## 2020-12-14 DIAGNOSIS — E119 Type 2 diabetes mellitus without complications: Secondary | ICD-10-CM

## 2020-12-14 DIAGNOSIS — I080 Rheumatic disorders of both mitral and aortic valves: Secondary | ICD-10-CM | POA: Diagnosis not present

## 2020-12-14 DIAGNOSIS — E1169 Type 2 diabetes mellitus with other specified complication: Secondary | ICD-10-CM | POA: Diagnosis not present

## 2020-12-14 DIAGNOSIS — G894 Chronic pain syndrome: Secondary | ICD-10-CM

## 2020-12-14 DIAGNOSIS — M2550 Pain in unspecified joint: Secondary | ICD-10-CM

## 2020-12-14 DIAGNOSIS — Z794 Long term (current) use of insulin: Secondary | ICD-10-CM | POA: Diagnosis not present

## 2020-12-14 DIAGNOSIS — N1832 Chronic kidney disease, stage 3b: Secondary | ICD-10-CM

## 2020-12-14 DIAGNOSIS — I119 Hypertensive heart disease without heart failure: Secondary | ICD-10-CM | POA: Diagnosis not present

## 2020-12-14 DIAGNOSIS — I480 Paroxysmal atrial fibrillation: Secondary | ICD-10-CM | POA: Diagnosis not present

## 2020-12-14 DIAGNOSIS — E1142 Type 2 diabetes mellitus with diabetic polyneuropathy: Secondary | ICD-10-CM | POA: Diagnosis not present

## 2020-12-14 DIAGNOSIS — I472 Ventricular tachycardia: Secondary | ICD-10-CM | POA: Diagnosis not present

## 2020-12-14 DIAGNOSIS — I2510 Atherosclerotic heart disease of native coronary artery without angina pectoris: Secondary | ICD-10-CM | POA: Diagnosis not present

## 2020-12-14 MED ORDER — CEPHALEXIN 500 MG PO CAPS: ORAL_CAPSULE | ORAL | 1 refills | Status: AC

## 2020-12-14 MED ORDER — INSULIN GLARGINE 100 UNITS/ML SOLOSTAR PEN: 10.0000 [IU] | PEN_INJECTOR | Freq: Every day | SUBCUTANEOUS | 0 refills | Status: DC

## 2020-12-14 MED ORDER — PREDNISONE 5 MG PO TABS: 7.5000 mg | ORAL_TABLET | Freq: Every day | ORAL | 0 refills | Status: DC

## 2020-12-14 NOTE — Telephone Encounter (Signed)
Called pharmacy spoke w/ CHELSEY ... She states there was two different instructions. Should pt take for 5 days or 7 days. Per Dr. Camila Li should be for 7 days.Marland KitchenJohny Chess

## 2020-12-14 NOTE — Telephone Encounter (Signed)
[facility] called and is needing clarification for cephALEXin (KEFLEX) 500 MG capsule. Please advise

## 2020-12-14 NOTE — Assessment & Plan Note (Signed)
Reduce Prednisone to 7.5 mg a day

## 2020-12-14 NOTE — Assessment & Plan Note (Signed)
Keflex po if suspected UTI

## 2020-12-14 NOTE — Assessment & Plan Note (Addendum)
   Increase Lantus insulin to 10 units daily.  Reduce prednisone to 7.5 mg a day.  Follow-up in 2 weeks.  Plan is to switch to oral agent

## 2020-12-14 NOTE — Progress Notes (Signed)
Virtual Visit via Video Note  I connected with Sandy Owens on 12/14/20 at  8:50 AM EDT by a video enabled telemedicine application and verified that I am speaking with the correct person using two identifiers.   I discussed the limitations of evaluation and management by telemedicine and the availability of in person appointments. The patient expressed understanding and agreed to proceed.  I was located at our [facility] office. The patient was at home. There was dtr [name] present in the visit.   History of Present Illness: We need to follow-up on DM, UTIs, OA  Sugars are elevated, however getting better.  Pain is still well controlled on lower dose steroid   Observations/Objective: The patient appears to be in no acute distress, looks ok.  Assessment and Plan:  See my Assessment and Plan. Follow Up Instructions:    I discussed the assessment and treatment plan with the patient. The patient was provided an opportunity to ask questions and all were answered. The patient agreed with the plan and demonstrated an understanding of the instructions.   The patient was advised to call back or seek an in-person evaluation if the symptoms worsen or if the condition fails to improve as anticipated.  I provided face-to-face time during this encounter. We were at different locations.   Walker Kehr, MD

## 2020-12-15 DIAGNOSIS — E1142 Type 2 diabetes mellitus with diabetic polyneuropathy: Secondary | ICD-10-CM | POA: Diagnosis not present

## 2020-12-15 DIAGNOSIS — I480 Paroxysmal atrial fibrillation: Secondary | ICD-10-CM | POA: Diagnosis not present

## 2020-12-15 DIAGNOSIS — F0390 Unspecified dementia without behavioral disturbance: Secondary | ICD-10-CM | POA: Diagnosis not present

## 2020-12-15 DIAGNOSIS — I445 Left posterior fascicular block: Secondary | ICD-10-CM | POA: Diagnosis not present

## 2020-12-15 DIAGNOSIS — I472 Ventricular tachycardia: Secondary | ICD-10-CM | POA: Diagnosis not present

## 2020-12-15 DIAGNOSIS — I119 Hypertensive heart disease without heart failure: Secondary | ICD-10-CM | POA: Diagnosis not present

## 2020-12-15 DIAGNOSIS — I2510 Atherosclerotic heart disease of native coronary artery without angina pectoris: Secondary | ICD-10-CM | POA: Diagnosis not present

## 2020-12-15 DIAGNOSIS — N390 Urinary tract infection, site not specified: Secondary | ICD-10-CM | POA: Diagnosis not present

## 2020-12-15 DIAGNOSIS — I080 Rheumatic disorders of both mitral and aortic valves: Secondary | ICD-10-CM | POA: Diagnosis not present

## 2020-12-17 NOTE — Assessment & Plan Note (Signed)
Severe pain.  Chronic pain due to severe osteoarthritis, gout, probable PMR The patient was much better on prednisone.  Currently trying to reduce the dose to accommodate for elevated blood sugars.  Doing well on 10 mg prednisone daily.  We will try to reduce to 7.5 mg

## 2020-12-17 NOTE — Assessment & Plan Note (Addendum)
Increase Lantus insulin to 10 units daily.  Reduce prednisone to 7.5 mg a day.  Follow-up in 2 weeks.  Plan is to switch to oral agent

## 2020-12-17 NOTE — Assessment & Plan Note (Signed)
Continue with amiodarone and Eliquis

## 2020-12-17 NOTE — Assessment & Plan Note (Signed)
Continue with good hydration

## 2020-12-18 DIAGNOSIS — I080 Rheumatic disorders of both mitral and aortic valves: Secondary | ICD-10-CM | POA: Diagnosis not present

## 2020-12-18 DIAGNOSIS — E1142 Type 2 diabetes mellitus with diabetic polyneuropathy: Secondary | ICD-10-CM | POA: Diagnosis not present

## 2020-12-18 DIAGNOSIS — I472 Ventricular tachycardia: Secondary | ICD-10-CM | POA: Diagnosis not present

## 2020-12-18 DIAGNOSIS — I119 Hypertensive heart disease without heart failure: Secondary | ICD-10-CM | POA: Diagnosis not present

## 2020-12-18 DIAGNOSIS — F0390 Unspecified dementia without behavioral disturbance: Secondary | ICD-10-CM | POA: Diagnosis not present

## 2020-12-18 DIAGNOSIS — I2510 Atherosclerotic heart disease of native coronary artery without angina pectoris: Secondary | ICD-10-CM | POA: Diagnosis not present

## 2020-12-18 DIAGNOSIS — I480 Paroxysmal atrial fibrillation: Secondary | ICD-10-CM | POA: Diagnosis not present

## 2020-12-18 DIAGNOSIS — N390 Urinary tract infection, site not specified: Secondary | ICD-10-CM | POA: Diagnosis not present

## 2020-12-18 DIAGNOSIS — I445 Left posterior fascicular block: Secondary | ICD-10-CM | POA: Diagnosis not present

## 2020-12-19 MED ORDER — INSULIN GLARGINE 100 UNITS/ML SOLOSTAR PEN: 10.0000 [IU] | PEN_INJECTOR | Freq: Every day | SUBCUTANEOUS | 3 refills | Status: DC

## 2020-12-19 NOTE — Addendum Note (Signed)
Addended by: Earnstine Regal on: 12/19/2020 01:16 PM   Modules accepted: Orders

## 2020-12-20 DIAGNOSIS — I480 Paroxysmal atrial fibrillation: Secondary | ICD-10-CM | POA: Diagnosis not present

## 2020-12-20 DIAGNOSIS — I080 Rheumatic disorders of both mitral and aortic valves: Secondary | ICD-10-CM | POA: Diagnosis not present

## 2020-12-20 DIAGNOSIS — I119 Hypertensive heart disease without heart failure: Secondary | ICD-10-CM | POA: Diagnosis not present

## 2020-12-20 DIAGNOSIS — N390 Urinary tract infection, site not specified: Secondary | ICD-10-CM | POA: Diagnosis not present

## 2020-12-20 DIAGNOSIS — E1142 Type 2 diabetes mellitus with diabetic polyneuropathy: Secondary | ICD-10-CM | POA: Diagnosis not present

## 2020-12-20 DIAGNOSIS — I445 Left posterior fascicular block: Secondary | ICD-10-CM | POA: Diagnosis not present

## 2020-12-20 DIAGNOSIS — F0390 Unspecified dementia without behavioral disturbance: Secondary | ICD-10-CM | POA: Diagnosis not present

## 2020-12-20 DIAGNOSIS — I2510 Atherosclerotic heart disease of native coronary artery without angina pectoris: Secondary | ICD-10-CM | POA: Diagnosis not present

## 2020-12-20 DIAGNOSIS — I472 Ventricular tachycardia: Secondary | ICD-10-CM | POA: Diagnosis not present

## 2020-12-20 MED ORDER — AMIODARONE HCL 200 MG PO TABS: 200.0000 mg | ORAL_TABLET | Freq: Two times a day (BID) | ORAL | 0 refills | Status: DC

## 2020-12-20 MED ORDER — AMIODARONE HCL 200 MG PO TABS: 200.0000 mg | ORAL_TABLET | Freq: Two times a day (BID) | ORAL | 3 refills | Status: AC

## 2020-12-20 MED ORDER — AMIODARONE HCL 200 MG PO TABS
200.0000 mg | ORAL_TABLET | Freq: Two times a day (BID) | ORAL | 0 refills | Status: DC
Start: 2020-12-20 — End: 2020-12-20

## 2020-12-20 NOTE — Addendum Note (Signed)
Addended by: Earnstine Regal on: 12/20/2020 09:46 AM   Modules accepted: Orders

## 2020-12-21 ENCOUNTER — Other Ambulatory Visit (HOSPITAL_COMMUNITY): Payer: Self-pay

## 2020-12-22 DIAGNOSIS — I445 Left posterior fascicular block: Secondary | ICD-10-CM | POA: Diagnosis not present

## 2020-12-22 DIAGNOSIS — F0390 Unspecified dementia without behavioral disturbance: Secondary | ICD-10-CM | POA: Diagnosis not present

## 2020-12-22 DIAGNOSIS — I119 Hypertensive heart disease without heart failure: Secondary | ICD-10-CM | POA: Diagnosis not present

## 2020-12-22 DIAGNOSIS — I480 Paroxysmal atrial fibrillation: Secondary | ICD-10-CM | POA: Diagnosis not present

## 2020-12-22 DIAGNOSIS — E1142 Type 2 diabetes mellitus with diabetic polyneuropathy: Secondary | ICD-10-CM | POA: Diagnosis not present

## 2020-12-22 DIAGNOSIS — I2510 Atherosclerotic heart disease of native coronary artery without angina pectoris: Secondary | ICD-10-CM | POA: Diagnosis not present

## 2020-12-22 DIAGNOSIS — N390 Urinary tract infection, site not specified: Secondary | ICD-10-CM | POA: Diagnosis not present

## 2020-12-22 DIAGNOSIS — I080 Rheumatic disorders of both mitral and aortic valves: Secondary | ICD-10-CM | POA: Diagnosis not present

## 2020-12-22 DIAGNOSIS — I472 Ventricular tachycardia: Secondary | ICD-10-CM | POA: Diagnosis not present

## 2020-12-25 DIAGNOSIS — I472 Ventricular tachycardia: Secondary | ICD-10-CM | POA: Diagnosis not present

## 2020-12-25 DIAGNOSIS — N390 Urinary tract infection, site not specified: Secondary | ICD-10-CM | POA: Diagnosis not present

## 2020-12-25 DIAGNOSIS — I119 Hypertensive heart disease without heart failure: Secondary | ICD-10-CM | POA: Diagnosis not present

## 2020-12-25 DIAGNOSIS — I480 Paroxysmal atrial fibrillation: Secondary | ICD-10-CM | POA: Diagnosis not present

## 2020-12-25 DIAGNOSIS — I2510 Atherosclerotic heart disease of native coronary artery without angina pectoris: Secondary | ICD-10-CM | POA: Diagnosis not present

## 2020-12-25 DIAGNOSIS — E1142 Type 2 diabetes mellitus with diabetic polyneuropathy: Secondary | ICD-10-CM | POA: Diagnosis not present

## 2020-12-25 DIAGNOSIS — I080 Rheumatic disorders of both mitral and aortic valves: Secondary | ICD-10-CM | POA: Diagnosis not present

## 2020-12-25 DIAGNOSIS — I445 Left posterior fascicular block: Secondary | ICD-10-CM | POA: Diagnosis not present

## 2020-12-25 DIAGNOSIS — F0390 Unspecified dementia without behavioral disturbance: Secondary | ICD-10-CM | POA: Diagnosis not present

## 2020-12-27 DIAGNOSIS — E1142 Type 2 diabetes mellitus with diabetic polyneuropathy: Secondary | ICD-10-CM | POA: Diagnosis not present

## 2020-12-27 DIAGNOSIS — I472 Ventricular tachycardia: Secondary | ICD-10-CM | POA: Diagnosis not present

## 2020-12-27 DIAGNOSIS — I080 Rheumatic disorders of both mitral and aortic valves: Secondary | ICD-10-CM | POA: Diagnosis not present

## 2020-12-27 DIAGNOSIS — N390 Urinary tract infection, site not specified: Secondary | ICD-10-CM | POA: Diagnosis not present

## 2020-12-27 DIAGNOSIS — F0390 Unspecified dementia without behavioral disturbance: Secondary | ICD-10-CM | POA: Diagnosis not present

## 2020-12-27 DIAGNOSIS — I480 Paroxysmal atrial fibrillation: Secondary | ICD-10-CM | POA: Diagnosis not present

## 2020-12-27 DIAGNOSIS — I445 Left posterior fascicular block: Secondary | ICD-10-CM | POA: Diagnosis not present

## 2020-12-27 DIAGNOSIS — I119 Hypertensive heart disease without heart failure: Secondary | ICD-10-CM | POA: Diagnosis not present

## 2020-12-27 DIAGNOSIS — I2510 Atherosclerotic heart disease of native coronary artery without angina pectoris: Secondary | ICD-10-CM | POA: Diagnosis not present

## 2020-12-28 ENCOUNTER — Encounter: Payer: Self-pay | Admitting: Internal Medicine

## 2020-12-28 ENCOUNTER — Telehealth (INDEPENDENT_AMBULATORY_CARE_PROVIDER_SITE_OTHER): Payer: Medicare HMO | Admitting: Internal Medicine

## 2020-12-28 ENCOUNTER — Ambulatory Visit (INDEPENDENT_AMBULATORY_CARE_PROVIDER_SITE_OTHER): Payer: Medicare HMO

## 2020-12-28 DIAGNOSIS — M2550 Pain in unspecified joint: Secondary | ICD-10-CM

## 2020-12-28 DIAGNOSIS — E669 Obesity, unspecified: Secondary | ICD-10-CM

## 2020-12-28 DIAGNOSIS — R413 Other amnesia: Secondary | ICD-10-CM

## 2020-12-28 DIAGNOSIS — Z0000 Encounter for general adult medical examination without abnormal findings: Secondary | ICD-10-CM | POA: Diagnosis not present

## 2020-12-28 DIAGNOSIS — R453 Demoralization and apathy: Secondary | ICD-10-CM | POA: Diagnosis not present

## 2020-12-28 DIAGNOSIS — E1169 Type 2 diabetes mellitus with other specified complication: Secondary | ICD-10-CM

## 2020-12-28 MED ORDER — GLIMEPIRIDE 2 MG PO TABS: 2.0000 mg | ORAL_TABLET | Freq: Two times a day (BID) | ORAL | 5 refills | Status: DC

## 2020-12-28 MED ORDER — PREDNISONE 5 MG PO TABS: 5.0000 mg | ORAL_TABLET | Freq: Every day | ORAL | 0 refills | Status: DC

## 2020-12-28 NOTE — Assessment & Plan Note (Addendum)
New. No depression. Options discussed - SSRIs, Ritalin

## 2020-12-28 NOTE — Assessment & Plan Note (Signed)
D/c Lantus. Start Amaryl 2 mg bid. Reduce Prednisone to 5 mg/d. RTC virtually in 2 wks

## 2020-12-28 NOTE — Patient Instructions (Signed)
Ms. Sandy Owens , Thank you for taking time to come for your Medicare Wellness Visit. I appreciate your ongoing commitment to your health goals. Please review the following plan we discussed and let me know if I can assist you in the future.   Screening recommendations/referrals: Colonoscopy: not a candidate for colon cancer screening due to age 87: last done 08/09/2019 Bone Density: never done or no record Recommended yearly ophthalmology/optometry visit for glaucoma screening and checkup Recommended yearly dental visit for hygiene and checkup  Vaccinations: Influenza vaccine: 06/06/2020 Pneumococcal vaccine: 04/21/2017, 09/17/2018 Tdap vaccine: 11/26/2017; due every 10 years Shingles vaccine: Please call your insurance company to determine your out of pocket expense for the Shingrix vaccine. You may receive this vaccine at your local pharmacy.  Covid-19: 10/15/2019, 11/09/2019, 08/24/2020  Advanced directives: Please bring a copy of your health care power of attorney and living will to the office at your convenience.  Conditions/risks identified: Yes; Reviewed health maintenance screenings with patient today and relevant education, vaccines, and/or referrals were provided. Please continue to do your personal lifestyle choices by: daily care of teeth and gums, regular physical activity (goal should be 5 days a week for 30 minutes), eat a healthy diet, avoid tobacco and drug use, limiting any alcohol intake, taking a low-dose aspirin (if not allergic or have been advised by your provider otherwise) and taking vitamins and minerals as recommended by your provider. Continue doing brain stimulating activities (puzzles, reading, adult coloring books, staying active) to keep memory sharp. Continue to eat heart healthy diet (full of fruits, vegetables, whole grains, lean protein, water--limit salt, fat, and sugar intake) and increase physical activity as tolerated.  Next appointment: Please schedule your  next Medicare Wellness Visit with your Nurse Health Advisor in 1 year by calling [phone].  Preventive Care 44 Years and Older, Female Preventive care refers to lifestyle choices and visits with your health care provider that can promote health and wellness. What does preventive care include?  A yearly physical exam. This is also called an annual well check.  Dental exams once or twice a year.  Routine eye exams. Ask your health care provider how often you should have your eyes checked.  Personal lifestyle choices, including:  Daily care of your teeth and gums.  Regular physical activity.  Eating a healthy diet.  Avoiding tobacco and drug use.  Limiting alcohol use.  Practicing safe sex.  Taking low-dose aspirin every day.  Taking vitamin and mineral supplements as recommended by your health care provider. What happens during an annual well check? The services and screenings done by your health care provider during your annual well check will depend on your age, overall health, lifestyle risk factors, and family history of disease. Counseling  Your health care provider may ask you questions about your:  Alcohol use.  Tobacco use.  Drug use.  Emotional well-being.  Home and relationship well-being.  Sexual activity.  Eating habits.  History of falls.  Memory and ability to understand (cognition).  Work and work Statistician.  Reproductive health. Screening  You may have the following tests or measurements:  Height, weight, and BMI.  Blood pressure.  Lipid and cholesterol levels. These may be checked every 5 years, or more frequently if you are over 55 years old.  Skin check.  Lung cancer screening. You may have this screening every year starting at age 11 if you have a 30-pack-year history of smoking and currently smoke or have quit within the past 15 years.  Fecal occult blood test (FOBT) of the stool. You may have this test every year starting  at age 49.  Flexible sigmoidoscopy or colonoscopy. You may have a sigmoidoscopy every 5 years or a colonoscopy every 10 years starting at age 75.  Hepatitis C blood test.  Hepatitis B blood test.  Sexually transmitted disease (STD) testing.  Diabetes screening. This is done by checking your blood sugar (glucose) after you have not eaten for a while (fasting). You may have this done every 1-3 years.  Bone density scan. This is done to screen for osteoporosis. You may have this done starting at age 40.  Mammogram. This may be done every 1-2 years. Talk to your health care provider about how often you should have regular mammograms. Talk with your health care provider about your test results, treatment options, and if necessary, the need for more tests. Vaccines  Your health care provider may recommend certain vaccines, such as:  Influenza vaccine. This is recommended every year.  Tetanus, diphtheria, and acellular pertussis (Tdap, Td) vaccine. You may need a Td booster every 10 years.  Zoster vaccine. You may need this after age 73.  Pneumococcal 13-valent conjugate (PCV13) vaccine. One dose is recommended after age 39.  Pneumococcal polysaccharide (PPSV23) vaccine. One dose is recommended after age 76. Talk to your health care provider about which screenings and vaccines you need and how often you need them. This information is not intended to replace advice given to you by your health care provider. Make sure you discuss any questions you have with your health care provider. Document Released: 09/15/2015 Document Revised: 05/08/2016 Document Reviewed: 06/20/2015 Elsevier Interactive Patient Education  2017 [place] Prevention in the Home Falls can cause injuries. They can happen to people of all ages. There are many things you can do to make your home safe and to help prevent falls. What can I do on the outside of my home?  Regularly fix the edges of walkways and  driveways and fix any cracks.  Remove anything that might make you trip as you walk through a door, such as a raised step or threshold.  Trim any bushes or trees on the path to your home.  Use bright outdoor lighting.  Clear any walking paths of anything that might make someone trip, such as rocks or tools.  Regularly check to see if handrails are loose or broken. Make sure that both sides of any steps have handrails.  Any raised decks and porches should have guardrails on the edges.  Have any leaves, snow, or ice cleared regularly.  Use sand or salt on walking paths during winter.  Clean up any spills in your garage right away. This includes oil or grease spills. What can I do in the bathroom?  Use night lights.  Install grab bars by the toilet and in the tub and shower. Do not use towel bars as grab bars.  Use non-skid mats or decals in the tub or shower.  If you need to sit down in the shower, use a plastic, non-slip stool.  Keep the floor dry. Clean up any water that spills on the floor as soon as it happens.  Remove soap buildup in the tub or shower regularly.  Attach bath mats securely with double-sided non-slip rug tape.  Do not have throw rugs and other things on the floor that can make you trip. What can I do in the bedroom?  Use night lights.  Make sure that  you have a light by your bed that is easy to reach.  Do not use any sheets or blankets that are too big for your bed. They should not hang down onto the floor.  Have a firm chair that has side arms. You can use this for support while you get dressed.  Do not have throw rugs and other things on the floor that can make you trip. What can I do in the kitchen?  Clean up any spills right away.  Avoid walking on wet floors.  Keep items that you use a lot in easy-to-reach places.  If you need to reach something above you, use a strong step stool that has a grab bar.  Keep electrical cords out of the  way.  Do not use floor polish or wax that makes floors slippery. If you must use wax, use non-skid floor wax.  Do not have throw rugs and other things on the floor that can make you trip. What can I do with my stairs?  Do not leave any items on the stairs.  Make sure that there are handrails on both sides of the stairs and use them. Fix handrails that are broken or loose. Make sure that handrails are as long as the stairways.  Check any carpeting to make sure that it is firmly attached to the stairs. Fix any carpet that is loose or worn.  Avoid having throw rugs at the top or bottom of the stairs. If you do have throw rugs, attach them to the floor with carpet tape.  Make sure that you have a light switch at the top of the stairs and the bottom of the stairs. If you do not have them, ask someone to add them for you. What else can I do to help prevent falls?  Wear shoes that:  Do not have high heels.  Have rubber bottoms.  Are comfortable and fit you well.  Are closed at the toe. Do not wear sandals.  If you use a stepladder:  Make sure that it is fully opened. Do not climb a closed stepladder.  Make sure that both sides of the stepladder are locked into place.  Ask someone to hold it for you, if possible.  Clearly mark and make sure that you can see:  Any grab bars or handrails.  First and last steps.  Where the edge of each step is.  Use tools that help you move around (mobility aids) if they are needed. These include:  Canes.  Walkers.  Scooters.  Crutches.  Turn on the lights when you go into a dark area. Replace any light bulbs as soon as they burn out.  Set up your furniture so you have a clear path. Avoid moving your furniture around.  If any of your floors are uneven, fix them.  If there are any pets around you, be aware of where they are.  Review your medicines with your doctor. Some medicines can make you feel dizzy. This can increase your chance  of falling. Ask your doctor what other things that you can do to help prevent falls. This information is not intended to replace advice given to you by your health care provider. Make sure you discuss any questions you have with your health care provider. Document Released: 06/15/2009 Document Revised: 01/25/2016 Document Reviewed: 09/23/2014 Elsevier Interactive Patient Education  2017 [employer].

## 2020-12-28 NOTE — Progress Notes (Signed)
Virtual Visit via Video Note  I connected with Sandy Owens on 12/28/20 at  1:40 PM EDT by a video enabled telemedicine application and verified that I am speaking with the correct person using two identifiers.   I discussed the limitations of evaluation and management by telemedicine and the availability of in person appointments. The patient expressed understanding and agreed to proceed.  I was located at our [facility] office. The patient was at home. [name] was present in the visit.   History of Present Illness: We need to follow-up on DM. CBGs 158-250 On 7.5 mg/d Prednisone No pain C/o apathy, sleeps a lot; no crying, not depressed  There has been no  chest pain, shortness of breath, abdominal pain, diarrhea, constipation, arthralgias.   Observations/Objective: The patient appears to be in no acute distress  Assessment and Plan:  See my Assessment and Plan. Follow Up Instructions:    I discussed the assessment and treatment plan with the patient. The patient was provided an opportunity to ask questions and all were answered. The patient agreed with the plan and demonstrated an understanding of the instructions.   The patient was advised to call back or seek an in-person evaluation if the symptoms worsen or if the condition fails to improve as anticipated.  I provided face-to-face time during this encounter. We were at different locations.   Walker Kehr, MD

## 2020-12-28 NOTE — Assessment & Plan Note (Signed)
No pains. Reduce Prednisone to 5 mg/d. RTC virtually in 2 wks

## 2020-12-28 NOTE — Progress Notes (Addendum)
I connected with [name] today by telephone and verified that I am speaking with the correct person using two identifiers. Location patient: home Location provider: work Persons participating in the virtual visit: Sandy Owens, Sandy Owens (daughter) and Lisette Abu, LPN.   I discussed the limitations, risks, security and privacy concerns of performing an evaluation and management service by telephone and the availability of in person appointments. I also discussed with the patient that there may be a patient responsible charge related to this service. The patient expressed understanding and verbally consented to this telephonic visit.    Interactive audio and video telecommunications were attempted between this provider and patient, however failed, due to patient having technical difficulties OR patient did not have access to video capability.  We continued and completed visit with audio only.  Some vital signs may be absent or patient reported.   Time Spent with patient on telephone encounter: 30 minutes  Subjective:   Sandy Owens is a 87 y.o. female who presents for Medicare Annual (Subsequent) preventive examination.  Review of Systems    No ROS. Medicare Wellness Virtual Visit. Additional risk factors are reflected in social history. Cardiac Risk Factors include: advanced age (>53mn, >>16women);diabetes mellitus;family history of premature cardiovascular disease;hypertension     Objective:    There were no vitals filed for this visit. There is no height or weight on file to calculate BMI.  Advanced Directives 12/28/2020 11/26/2020 11/25/2020 12/31/2015 06/06/2014 05/25/2014  Does Patient Have a Medical Advance Directive? Yes - Yes No Yes Yes  Type of [place] (pink MOST or yellow form) - - Living will  Does patient want to make changes to medical advance  directive? No - Patient declined Yes (Inpatient - patient defers changing a medical advance directive at this time - Information given) - - - -  Copy of [place] Chart? No - copy requested No - copy requested - - - No - copy requested  Would patient like information on creating a medical advance directive? - - - No - patient declined information - -  Pre-existing out of facility DNR order (yellow form or pink MOST form) - Physician notified to receive inpatient order Pink Most/Yellow Form available - Physician notified to receive inpatient order - - -    Current Medications (verified) Outpatient Encounter Medications as of 12/28/2020  Medication Sig   Accu-Chek Softclix Lancets lancets USE UP TO FOUR TIMES DAILY AS DIRECTED   amiodarone (PACERONE) 200 MG tablet Take 1 tablet (200 mg total) by mouth 2 (two) times daily.   amLODipine (NORVASC) 5 MG tablet TAKE 1 TABLET(5 MG) BY MOUTH DAILY (Patient taking differently: Take 5 mg by mouth daily.)   apixaban (ELIQUIS) 2.5 MG TABS tablet Take 1 tablet (2.5 mg total) by mouth 2 (two) times daily.   Artificial Tear Ointment (ARTIFICIAL TEARS) ointment Place 1 application into both eyes as needed (dry eyes).   atenolol (TENORMIN) 25 MG tablet Take 1 tablet (25 mg total) by mouth daily.   blood glucose meter kit and supplies KIT Dispense based on patient and insurance preference. Use up to four times daily as directed.   Calcium Carb-Cholecalciferol (CALCIUM+D3 PO) Take 1 tablet by mouth daily with breakfast.   cephALEXin (KEFLEX) 500 MG capsule TAKE 1 CAPSULE (500 MG TOTAL) BY MOUTH EVERY EIGHT HOURS FOR 5 DAYS.   Cholecalciferol (VITAMIN D3) 50 MCG (  2000 UT) capsule Take 1 capsule (2,000 Units total) by mouth daily.   CINNAMON PO Take 2,000 mg by mouth daily.   Dextran 70-Hypromellose (ARTIFICIAL TEARS PF OP) Place 1 drop into both eyes 3 (three) times daily as needed (for dryness).   donepezil (ARICEPT) 5 MG tablet Take 1 tablet  (5 mg total) by mouth at bedtime.   glimepiride (AMARYL) 2 MG tablet Take 1 tablet (2 mg total) by mouth in the morning and at bedtime.   glucose blood test strip USE UP TO FOUR TIMES DAILY AS DIRECTED.   insulin glargine (LANTUS) 100 unit/mL SOPN Inject 10 Units into the skin daily.   Insulin Pen Needle 32G X 4 MM MISC USE AS DIRECTED TO INJECT INSULIN   levothyroxine (SYNTHROID) 88 MCG tablet TAKE 1 TABLET(88 MCG) BY MOUTH DAILY (Patient taking differently: Take 88 mcg by mouth daily before breakfast.)   Multiple Vitamins-Minerals (PRESERVISION AREDS 2 PO) Take 1 capsule by mouth in the morning and at bedtime.   NON FORMULARY Take 1 capsule by mouth See admin instructions. [facility], Memory and nerve support capsules- Take 2 capsules by mouth once a day   Omega-3 Fatty Acids (FISH OIL PO) Take 1,400 mg by mouth 2 (two) times daily.    rosuvastatin (CRESTOR) 10 MG tablet TAKE 1 TABLET (10 MG TOTAL) BY MOUTH DAILY.   [DISCONTINUED] predniSONE (DELTASONE) 5 MG tablet Take 1.5 tablets (7.5 mg total) by mouth daily with breakfast.   No facility-administered encounter medications on file as of 12/28/2020.    Allergies (verified) Tape, Lisinopril, and Statins   History: Past Medical History:  Diagnosis Date   Arthritis    Breast cancer ([place])    left   Cancer ([place])    left breast   Coronary artery disease    CABG - 3- 2001   Dementia ([place])    Diabetes mellitus without complication ([place])    Gout    High cholesterol    Hypertension    Hypothyroidism    Myocardial infarction ([place])    Neuromuscular disorder ([place])    neuropathy in feet  and legs   Past Surgical History:  Procedure Laterality Date   ABDOMINAL HYSTERECTOMY     APPENDECTOMY     BACK SURGERY     cabg     CARDIAC CATHETERIZATION     CARDIAC SURGERY     Bypass   CORONARY ARTERY BYPASS GRAFT     EYE SURGERY     HIP SURGERY      right hip replacement   JOINT REPLACEMENT     left knee replacement   JOINT REPLACEMENT       right hip replacement   MASTECTOMY Left    MULTIPLE EXTRACTIONS WITH ALVEOLOPLASTY N/A 11/28/2017   Procedure: MULTIPLE EXTRACTION WITH ALVEOLOPLASTY;  Surgeon: Diona Browner, DDS;  Location: [place];  Service: Oral Surgery;  Laterality: N/A;   SIMPLE MASTECTOMY WITH AXILLARY SENTINEL NODE BIOPSY Left 06/06/2014   Procedure: LEFT SIMPLE MASTECTOMY;  Surgeon: Excell Seltzer, MD;  Location: [facility];  Service: General;  Laterality: Left;   Family History  Problem Relation Age of Onset   Heart Problems Mother    Leukemia Father    Breast cancer Neg Hx    Social History   Socioeconomic History   Marital status: Widowed    Spouse name: Not on file   Number of children: 3   Years of education: 18   Highest education level: Not on file  Occupational History   Occupation: Retired  Tobacco Use   Smoking status: Former Smoker    Packs/day: 0.25    Years: 20.00    Pack years: 5.00    Types: Cigarettes    Quit date: 1980    Years since quitting: 42.3   Smokeless tobacco: Never Used   Tobacco comment: quit 40 years ago   Vaping Use   Vaping Use: Never used  Substance and Sexual Activity   Alcohol use: Yes    Comment: occasional - drinks wine   Drug use: No   Sexual activity: Not Currently  Other Topics Concern   Not on file  Social History Narrative   Lives alone   Caffeine use: coffee, tea   Right handed       One daughter has passed away   Social Determinants of Health   Financial Resource Strain: Low Risk    Difficulty of Paying Living Expenses: Not hard at all  Food Insecurity: No Food Insecurity   Worried About Charity fundraiser in the Last Year: Never true   Ran Out of Food in the Last Year: Never true  Transportation Needs: No Transportation Needs   Lack of Transportation (Medical): No   Lack of Transportation (Non-Medical): No  Physical Activity: Insufficiently Active   Days of Exercise per Week: 7 days   Minutes of Exercise per Session:  20 min  Stress: No Stress Concern Present   Feeling of Stress : Not at all  Social Connections: Not on file    Tobacco Counseling Counseling given: Not Answered Comment: quit 40 years ago    Clinical Intake:  Pre-visit preparation completed: Yes  Pain : No/denies pain     Nutritional Risks: None Diabetes: Yes CBG done?: No Did pt. bring in CBG monitor from home?: No  How often do you need to have someone help you when you read instructions, pamphlets, or other written materials from your doctor or pharmacy?: 1 - Never What is the last grade level you completed in school?: 11th grade  Diabetic? yes  Interpreter Needed?: No  Information entered by :: Lisette Abu, LPN   Activities of Daily Living In your present state of health, do you have any difficulty performing the following activities: 12/28/2020 11/25/2020  Hearing? N N  Vision? N Y  Difficulty concentrating or making decisions? Y Y  Comment Dementia -  Walking or climbing stairs? Y Y  Comment Uses a walker -  Dressing or bathing? Tempie Donning  Comment Needs assistance -  Doing errands, shopping? Tempie Donning  Preparing Food and eating ? Y -  Using the Toilet? Y -  In the past six months, have you accidently leaked urine? Y -  Comment wears protection -  Do you have problems with loss of bowel control? Y -  Comment wears protection -  Managing your Medications? Y -  Comment Handled by daughter -  Managing your Finances? Y -  Comment Handled by daughter -  Housekeeping or managing your Housekeeping? Y -  Comment Handled by daughter -  Some recent data might be hidden    Patient Care Team: Plotnikov, Evie Lacks, MD as PCP - General (Internal Medicine) Excell Seltzer, MD (Inactive) as Consulting Physician (General Surgery)  Indicate any recent Medical Services you may have received from other than [facility] providers in the past year (date may be approximate).     Assessment:   This is a routine wellness  examination for Sandy Owens.  Hearing/Vision  screen No exam data present  Dietary issues and exercise activities discussed: Current Exercise Habits: Home exercise routine, Type of exercise: walking;Other - see comments (sitting leg exercises), Time (Minutes): 20, Frequency (Times/Week): 7, Weekly Exercise (Minutes/Week): 140, Intensity: Mild, Exercise limited by: cardiac condition(s);neurologic condition(s);orthopedic condition(s)  Goals   None    Depression Screen PHQ 2/9 Scores 12/28/2020 09/06/2020 06/06/2020  PHQ - 2 Score 0 0 0    Fall Risk Fall Risk  12/28/2020 09/06/2020 06/06/2020 05/20/2018 02/02/2018  Falls in the past year? 0 1 0 Yes No  Number falls in past yr: 0 1 0 1 -  Injury with Fall? 0 0 0 No -  Risk for fall due to : Impaired balance/gait;History of fall(s) Impaired balance/gait;Impaired mobility - - -  Follow up Falls evaluation completed Falls prevention discussed - - -    FALL RISK PREVENTION PERTAINING TO THE HOME:  Any stairs in or around the home? Yes  If so, are there any without handrails? No  Home free of loose throw rugs in walkways, pet beds, electrical cords, etc? Yes  Adequate lighting in your home to reduce risk of falls? Yes   ASSISTIVE DEVICES UTILIZED TO PREVENT FALLS:  Life alert? Yes  Use of a cane, walker or w/c? Yes  Grab bars in the bathroom? No  Shower chair or bench in shower? Yes  Elevated toilet seat or a handicapped toilet? Yes   TIMED UP AND GO:  Was the test performed? No .  Length of time to ambulate 10 feet: 0 sec.   Gait slow and steady with assistive device  Cognitive Function: Patient has current diagnosis of cognitive impairment.   MMSE - Mini Mental State Exam 05/20/2018  Orientation to time 4  Orientation to Place 5  Registration 3  Attention/ Calculation 5  Recall 2  Language- name 2 objects 2  Language- repeat 1  Language- follow 3 step command 3  Language- read & follow direction 1  Write a sentence 1  Copy design 0   Total score 27        Immunizations Immunization History  Administered Date(s) Administered   Fluad Quad(high Dose 65+) 06/06/2020   Influenza, High Dose Seasonal PF 06/09/2013, 06/12/2015, 04/30/2016   Influenza, Quadrivalent, Recombinant, Inj, Pf 06/19/2017, 06/03/2018, 06/03/2019   Influenza-Unspecified 06/02/2018   PFIZER(Purple Top)SARS-COV-2 Vaccination 10/15/2019, 11/09/2019, 08/24/2020   Pneumococcal Conjugate-13 04/21/2017   Pneumococcal Polysaccharide-23 10/03/2004, 12/30/2012, 09/17/2018   Tdap 12/30/2012, 11/26/2017    TDAP status: Up to date  Flu Vaccine status: Up to date  Pneumococcal vaccine status: Up to date  Covid-19 vaccine status: Completed vaccines  Qualifies for Shingles Vaccine? Yes   Zostavax completed No   Shingrix Completed?: No.    Education has been provided regarding the importance of this vaccine. Patient has been advised to call insurance company to determine out of pocket expense if they have not yet received this vaccine. Advised may also receive vaccine at local pharmacy or Health Dept. Verbalized acceptance and understanding.  Screening Tests Health Maintenance  Topic Date Due   OPHTHALMOLOGY EXAM  Never done   URINE MICROALBUMIN  Never done   DEXA SCAN  Never done   COVID-19 Vaccine (4 - Booster for Pfizer series) 02/22/2021   FOOT EXAM  03/07/2021   INFLUENZA VACCINE  04/02/2021   HEMOGLOBIN A1C  05/28/2021   TETANUS/TDAP  11/27/2027   PNA vac Low Risk Adult  Completed   HPV VACCINES  Aged Out  Health Maintenance  Health Maintenance Due  Topic Date Due   OPHTHALMOLOGY EXAM  Never done   URINE MICROALBUMIN  Never done   DEXA SCAN  Never done    Colorectal cancer screening: No longer required.   Mammogram status: Completed 08/09/2019. Repeat every year  Bone density status: no record  Lung Cancer Screening: (Low Dose CT Chest recommended if Age 40-80 years, 30 pack-year currently smoking OR have quit w/in 15years.)  does not qualify.   Lung Cancer Screening Referral: no  Additional Screening:  Hepatitis C Screening: does not qualify; Completed no  Vision Screening: Recommended annual ophthalmology exams for early detection of glaucoma and other disorders of the eye. Is the patient up to date with their annual eye exam?  Yes  Who is the provider or what is the name of the office in which the patient attends annual eye exams? [facility] If pt is not established with a provider, would they like to be referred to a provider to establish care? No .   Dental Screening: Recommended annual dental exams for proper oral hygiene  Community Resource Referral / Chronic Care Management: CRR required this visit?  No   CCM required this visit?  No      Plan:     I have personally reviewed and noted the following in the patient's chart:   Medical and social history Use of alcohol, tobacco or illicit drugs  Current medications and supplements Functional ability and status Nutritional status Physical activity Advanced directives List of other physicians Hospitalizations, surgeries, and ER visits in previous 12 months Vitals Screenings to include cognitive, depression, and falls Referrals and appointments  In addition, I have reviewed and discussed with patient certain preventive protocols, quality metrics, and best practice recommendations. A written personalized care plan for preventive services as well as general preventive health recommendations were provided to patient.     Sheral Flow, LPN   4/33/2951   Nurse Notes:  There were no vitals filed for this visit. There is no height or weight on file to calculate BMI. Medications reviewed with patient; no opioid use noted.  Medical screening examination/treatment/procedure(s) were performed by non-physician practitioner and as supervising physician I was immediately available for consultation/collaboration.  I agree with above.  Lew Dawes, MD

## 2020-12-28 NOTE — Assessment & Plan Note (Signed)
Doing well 

## 2020-12-29 ENCOUNTER — Telehealth: Payer: Self-pay | Admitting: Internal Medicine

## 2020-12-29 DIAGNOSIS — I480 Paroxysmal atrial fibrillation: Secondary | ICD-10-CM | POA: Diagnosis not present

## 2020-12-29 DIAGNOSIS — I080 Rheumatic disorders of both mitral and aortic valves: Secondary | ICD-10-CM | POA: Diagnosis not present

## 2020-12-29 DIAGNOSIS — N390 Urinary tract infection, site not specified: Secondary | ICD-10-CM | POA: Diagnosis not present

## 2020-12-29 DIAGNOSIS — I445 Left posterior fascicular block: Secondary | ICD-10-CM | POA: Diagnosis not present

## 2020-12-29 DIAGNOSIS — I472 Ventricular tachycardia: Secondary | ICD-10-CM | POA: Diagnosis not present

## 2020-12-29 DIAGNOSIS — E1142 Type 2 diabetes mellitus with diabetic polyneuropathy: Secondary | ICD-10-CM | POA: Diagnosis not present

## 2020-12-29 DIAGNOSIS — F0390 Unspecified dementia without behavioral disturbance: Secondary | ICD-10-CM | POA: Diagnosis not present

## 2020-12-29 DIAGNOSIS — I2510 Atherosclerotic heart disease of native coronary artery without angina pectoris: Secondary | ICD-10-CM | POA: Diagnosis not present

## 2020-12-29 DIAGNOSIS — I119 Hypertensive heart disease without heart failure: Secondary | ICD-10-CM | POA: Diagnosis not present

## 2020-12-29 NOTE — Telephone Encounter (Signed)
Notified [name] w/Md response.Sandy KitchenJohny Chess

## 2020-12-29 NOTE — Telephone Encounter (Signed)
MD is out of the office pls advise../lmb 

## 2020-12-29 NOTE — Telephone Encounter (Signed)
Okay for verbals and gel cushion. Can use zinc oxide for sores and redness.

## 2020-12-29 NOTE — Telephone Encounter (Signed)
[employer] a nurse from [facility] calling, requesting to extend home health nursing for 1 w 3. Patient has a open sore on both butt cheeks and is red. Asking for verbals for a barrier cream for zinc oxide or whatever Dr. Alain Marion recommends. Also requesting a gell cushion for sitting.  [employer]- [phone] Ok to lvm

## 2021-01-01 DIAGNOSIS — I472 Ventricular tachycardia: Secondary | ICD-10-CM | POA: Diagnosis not present

## 2021-01-01 DIAGNOSIS — I480 Paroxysmal atrial fibrillation: Secondary | ICD-10-CM | POA: Diagnosis not present

## 2021-01-01 DIAGNOSIS — I445 Left posterior fascicular block: Secondary | ICD-10-CM | POA: Diagnosis not present

## 2021-01-01 DIAGNOSIS — E1142 Type 2 diabetes mellitus with diabetic polyneuropathy: Secondary | ICD-10-CM | POA: Diagnosis not present

## 2021-01-01 DIAGNOSIS — F0390 Unspecified dementia without behavioral disturbance: Secondary | ICD-10-CM | POA: Diagnosis not present

## 2021-01-01 DIAGNOSIS — N390 Urinary tract infection, site not specified: Secondary | ICD-10-CM | POA: Diagnosis not present

## 2021-01-01 DIAGNOSIS — I119 Hypertensive heart disease without heart failure: Secondary | ICD-10-CM | POA: Diagnosis not present

## 2021-01-01 DIAGNOSIS — I080 Rheumatic disorders of both mitral and aortic valves: Secondary | ICD-10-CM | POA: Diagnosis not present

## 2021-01-01 DIAGNOSIS — I10 Essential (primary) hypertension: Secondary | ICD-10-CM | POA: Diagnosis not present

## 2021-01-01 DIAGNOSIS — I2510 Atherosclerotic heart disease of native coronary artery without angina pectoris: Secondary | ICD-10-CM | POA: Diagnosis not present

## 2021-01-04 DIAGNOSIS — E1142 Type 2 diabetes mellitus with diabetic polyneuropathy: Secondary | ICD-10-CM | POA: Diagnosis not present

## 2021-01-04 DIAGNOSIS — I472 Ventricular tachycardia: Secondary | ICD-10-CM | POA: Diagnosis not present

## 2021-01-04 DIAGNOSIS — I119 Hypertensive heart disease without heart failure: Secondary | ICD-10-CM | POA: Diagnosis not present

## 2021-01-04 DIAGNOSIS — I480 Paroxysmal atrial fibrillation: Secondary | ICD-10-CM | POA: Diagnosis not present

## 2021-01-04 DIAGNOSIS — I2510 Atherosclerotic heart disease of native coronary artery without angina pectoris: Secondary | ICD-10-CM | POA: Diagnosis not present

## 2021-01-04 DIAGNOSIS — I080 Rheumatic disorders of both mitral and aortic valves: Secondary | ICD-10-CM | POA: Diagnosis not present

## 2021-01-04 DIAGNOSIS — I445 Left posterior fascicular block: Secondary | ICD-10-CM | POA: Diagnosis not present

## 2021-01-04 DIAGNOSIS — N390 Urinary tract infection, site not specified: Secondary | ICD-10-CM | POA: Diagnosis not present

## 2021-01-04 DIAGNOSIS — F0390 Unspecified dementia without behavioral disturbance: Secondary | ICD-10-CM | POA: Diagnosis not present

## 2021-01-04 NOTE — Telephone Encounter (Signed)
Okay with me.Can move it up sooner, if needed and possible.  Thanks MJP

## 2021-01-05 ENCOUNTER — Other Ambulatory Visit: Payer: Self-pay | Admitting: Internal Medicine

## 2021-01-05 MED ORDER — GLIMEPIRIDE 2 MG PO TABS
ORAL_TABLET | ORAL | 5 refills | Status: DC
Start: 2021-01-05 — End: 2021-01-25

## 2021-01-05 NOTE — Telephone Encounter (Signed)
Ok. Please let the patient know.  Thanks MJP

## 2021-01-07 DIAGNOSIS — I10 Essential (primary) hypertension: Secondary | ICD-10-CM

## 2021-01-07 DIAGNOSIS — I480 Paroxysmal atrial fibrillation: Secondary | ICD-10-CM

## 2021-01-08 DIAGNOSIS — I119 Hypertensive heart disease without heart failure: Secondary | ICD-10-CM | POA: Diagnosis not present

## 2021-01-08 DIAGNOSIS — N390 Urinary tract infection, site not specified: Secondary | ICD-10-CM | POA: Diagnosis not present

## 2021-01-08 DIAGNOSIS — F0390 Unspecified dementia without behavioral disturbance: Secondary | ICD-10-CM | POA: Diagnosis not present

## 2021-01-08 DIAGNOSIS — I480 Paroxysmal atrial fibrillation: Secondary | ICD-10-CM | POA: Diagnosis not present

## 2021-01-08 DIAGNOSIS — E1142 Type 2 diabetes mellitus with diabetic polyneuropathy: Secondary | ICD-10-CM | POA: Diagnosis not present

## 2021-01-08 DIAGNOSIS — I472 Ventricular tachycardia: Secondary | ICD-10-CM | POA: Diagnosis not present

## 2021-01-08 DIAGNOSIS — I2510 Atherosclerotic heart disease of native coronary artery without angina pectoris: Secondary | ICD-10-CM | POA: Diagnosis not present

## 2021-01-08 DIAGNOSIS — I080 Rheumatic disorders of both mitral and aortic valves: Secondary | ICD-10-CM | POA: Diagnosis not present

## 2021-01-08 DIAGNOSIS — I445 Left posterior fascicular block: Secondary | ICD-10-CM | POA: Diagnosis not present

## 2021-01-08 MED ORDER — ATENOLOL 25 MG PO TABS
25.0000 mg | ORAL_TABLET | Freq: Every day | ORAL | 0 refills | Status: AC
Start: 2021-01-08 — End: ?

## 2021-01-10 ENCOUNTER — Other Ambulatory Visit: Payer: Self-pay | Admitting: Internal Medicine

## 2021-01-10 DIAGNOSIS — I214 Non-ST elevation (NSTEMI) myocardial infarction: Secondary | ICD-10-CM

## 2021-01-10 DIAGNOSIS — C50912 Malignant neoplasm of unspecified site of left female breast: Secondary | ICD-10-CM

## 2021-01-10 DIAGNOSIS — I25810 Atherosclerosis of coronary artery bypass graft(s) without angina pectoris: Secondary | ICD-10-CM

## 2021-01-10 DIAGNOSIS — R0902 Hypoxemia: Secondary | ICD-10-CM

## 2021-01-10 NOTE — Telephone Encounter (Signed)
Sandy Owens with [facility] calling, you [facility] and they have agreed to do her hospice and is asking for an urgent referral for the patient. Patient and daughter agree to hospice.

## 2021-01-11 ENCOUNTER — Telehealth (INDEPENDENT_AMBULATORY_CARE_PROVIDER_SITE_OTHER): Payer: Medicare HMO | Admitting: Internal Medicine

## 2021-01-11 ENCOUNTER — Other Ambulatory Visit: Payer: Self-pay

## 2021-01-11 ENCOUNTER — Encounter: Payer: Self-pay | Admitting: Internal Medicine

## 2021-01-11 DIAGNOSIS — E669 Obesity, unspecified: Secondary | ICD-10-CM | POA: Diagnosis not present

## 2021-01-11 DIAGNOSIS — I25810 Atherosclerosis of coronary artery bypass graft(s) without angina pectoris: Secondary | ICD-10-CM | POA: Diagnosis not present

## 2021-01-11 DIAGNOSIS — E1169 Type 2 diabetes mellitus with other specified complication: Secondary | ICD-10-CM | POA: Diagnosis not present

## 2021-01-11 DIAGNOSIS — R627 Adult failure to thrive: Secondary | ICD-10-CM

## 2021-01-11 DIAGNOSIS — R0902 Hypoxemia: Secondary | ICD-10-CM

## 2021-01-11 NOTE — Assessment & Plan Note (Signed)
Remains uncontrolled with fluctuating blood sugars.  CBGs 75-300s, mostly in the 100 range.  We will continue with glimepiride at current dose.  Virtual office visit in 2 weeks.  No hypoglycemic episodes.  Off insulin

## 2021-01-11 NOTE — Progress Notes (Signed)
Virtual Visit via Video Note  I connected with Shanay Hennis Winchel on 01/11/21 at 10:40 AM EDT by a video enabled telemedicine application and verified that I am speaking with the correct person using two identifiers.   I discussed the limitations of evaluation and management by telemedicine and the availability of in person appointments. The patient expressed understanding and agreed to proceed.  I was located at our [facility] office. The patient was at home. Lawerance Cruel present in the visit.   History of Present Illness: We need to follow-up on DM, FTT, LBP  There has been no runny nose, cough, chest pain. C/o mild shortness of breath - DOE, low O2 sats   Observations/Objective: The patient appears to be in no acute distress.  Assessment and Plan:  See my Assessment and Plan. Follow Up Instructions:    I discussed the assessment and treatment plan with the patient. The patient was provided an opportunity to ask questions and all were answered. The patient agreed with the plan and demonstrated an understanding of the instructions.   The patient was advised to call back or seek an in-person evaluation if the symptoms worsen or if the condition fails to improve as anticipated.  I provided face-to-face time during this encounter. We were at different locations.   Walker Kehr, MD

## 2021-01-11 NOTE — Assessment & Plan Note (Signed)
Hospice consultation is pending today.  No angina.  The patient is hypoxic today without any clinical signs of CHF The patient will need oxygen

## 2021-01-11 NOTE — Assessment & Plan Note (Signed)
Multifactorial.  Hospice referral

## 2021-01-11 NOTE — Assessment & Plan Note (Signed)
Hospice consultation is pending today.  No angina.  The patient is hypoxic today without any clinical signs of CHF

## 2021-01-11 NOTE — Telephone Encounter (Signed)
Called [facility] spoke w/ Tiffany. Gave MD response for referral. She inform me to fax the referral along w/ pt demographic info to [phone]. Faxed referral../lmb

## 2021-01-12 ENCOUNTER — Telehealth: Payer: Medicare HMO | Admitting: Cardiology

## 2021-01-15 NOTE — Telephone Encounter (Signed)
Please cancel May 20 appt.  Thanks MJP

## 2021-01-16 ENCOUNTER — Ambulatory Visit: Payer: Medicare HMO | Admitting: Internal Medicine

## 2021-01-18 NOTE — Telephone Encounter (Signed)
Called and discussed with daughter. Daughter to bring in the monitor

## 2021-01-19 ENCOUNTER — Ambulatory Visit: Payer: Medicare HMO | Admitting: Cardiology

## 2021-01-24 ENCOUNTER — Telehealth: Payer: Medicare HMO | Admitting: Cardiology

## 2021-01-25 ENCOUNTER — Telehealth (INDEPENDENT_AMBULATORY_CARE_PROVIDER_SITE_OTHER): Payer: Medicare HMO | Admitting: Internal Medicine

## 2021-01-25 DIAGNOSIS — M2550 Pain in unspecified joint: Secondary | ICD-10-CM | POA: Diagnosis not present

## 2021-01-25 DIAGNOSIS — E119 Type 2 diabetes mellitus without complications: Secondary | ICD-10-CM | POA: Diagnosis not present

## 2021-01-25 DIAGNOSIS — G894 Chronic pain syndrome: Secondary | ICD-10-CM | POA: Diagnosis not present

## 2021-01-25 DIAGNOSIS — Z794 Long term (current) use of insulin: Secondary | ICD-10-CM

## 2021-01-25 MED ORDER — GLIMEPIRIDE 4 MG PO TABS: 4.0000 mg | ORAL_TABLET | Freq: Two times a day (BID) | ORAL | 5 refills | Status: DC

## 2021-01-25 MED ORDER — LIDOCAINE 5 % EX OINT: 1.0000 "application " | TOPICAL_OINTMENT | Freq: Four times a day (QID) | CUTANEOUS | 2 refills | Status: AC | PRN

## 2021-01-25 MED ORDER — PREDNISONE 5 MG PO TABS: 10.0000 mg | ORAL_TABLET | Freq: Every day | ORAL | 5 refills | Status: AC

## 2021-01-25 NOTE — Assessment & Plan Note (Signed)
Likely PMR.  Poor pain control.  Will start prednisone 10 mg a day.  We will compensate with higher dose of glimepiride.  Use oxycodone 5 mg tablets one quarter-one half-1 tablet as needed.  Avoid oversedation

## 2021-01-25 NOTE — Progress Notes (Signed)
Virtual Visit via Telephone Note  I connected with Sandy Owens on 01/25/21 at 11:00 AM EDT by telephone and verified that I am speaking with the correct person using two identifiers.  Location: Patient: Home Provider: Home office Lawerance Cruel, the patient's daughter is present   I discussed the limitations, risks, security and privacy concerns of performing an evaluation and management service by telephone and the availability of in person appointments. I also discussed with the patient that there may be a patient responsible charge related to this service. The patient expressed understanding and agreed to proceed.   History of Present Illness:   It is a 2-week follow-up.  The patient has been having pain in the back during the day.  5 mg of prednisone did not seem to control it.  She is on oxycodone, 5 mg every 4 hours.  However, when she takes it often, she gets very sleepy.  She is able to tolerate 5 mg at nighttime.  Her sugar readings are [phone].  There is a pressure sore between the buttocks that is not amenable to DuoDERM or other type of patch covering.  Hospice is attending. Observations/Objective:  No change Assessment and Plan:  See plan Follow Up Instructions:    I discussed the assessment and treatment plan with the patient. The patient was provided an opportunity to ask questions and all were answered. The patient agreed with the plan and demonstrated an understanding of the instructions.   The patient was advised to call back or seek an in-person evaluation if the symptoms worsen or if the condition fails to improve as anticipated.  I provided 22 minutes of non-face-to-face time during this encounter.   Walker Kehr, MD

## 2021-01-25 NOTE — Assessment & Plan Note (Signed)
Likely PMR.  Poor pain control.  Will start prednisone 10 mg a day.  We will compensate with higher dose of glimepiride.  Use oxycodone 5 mg tablets one quarter-one half-1 tablet as needed.  Avoid oversedation. Lidocaine worse.  For pressure sore pain

## 2021-01-25 NOTE — Assessment & Plan Note (Signed)
Will compensate with higher dose of glimepiride for the steroid use

## 2021-01-30 ENCOUNTER — Other Ambulatory Visit: Payer: Self-pay

## 2021-01-30 MED ORDER — ROSUVASTATIN CALCIUM 10 MG PO TABS: ORAL_TABLET | Freq: Every day | ORAL | 1 refills | Status: DC

## 2021-02-07 ENCOUNTER — Other Ambulatory Visit: Payer: Self-pay

## 2021-02-07 MED ORDER — ROSUVASTATIN CALCIUM 10 MG PO TABS: ORAL_TABLET | Freq: Every day | ORAL | 1 refills | Status: AC

## 2021-02-12 ENCOUNTER — Encounter: Payer: Self-pay | Admitting: Internal Medicine

## 2021-02-12 ENCOUNTER — Telehealth (INDEPENDENT_AMBULATORY_CARE_PROVIDER_SITE_OTHER): Payer: Medicare HMO | Admitting: Internal Medicine

## 2021-02-12 DIAGNOSIS — M2550 Pain in unspecified joint: Secondary | ICD-10-CM

## 2021-02-12 DIAGNOSIS — K5903 Drug induced constipation: Secondary | ICD-10-CM

## 2021-02-12 DIAGNOSIS — E119 Type 2 diabetes mellitus without complications: Secondary | ICD-10-CM

## 2021-02-12 DIAGNOSIS — R627 Adult failure to thrive: Secondary | ICD-10-CM | POA: Diagnosis not present

## 2021-02-12 DIAGNOSIS — L89302 Pressure ulcer of unspecified buttock, stage 2: Secondary | ICD-10-CM | POA: Diagnosis not present

## 2021-02-12 DIAGNOSIS — Z794 Long term (current) use of insulin: Secondary | ICD-10-CM

## 2021-02-12 DIAGNOSIS — K5900 Constipation, unspecified: Secondary | ICD-10-CM | POA: Insufficient documentation

## 2021-02-12 DIAGNOSIS — I700 Atherosclerosis of aorta: Secondary | ICD-10-CM | POA: Diagnosis not present

## 2021-02-12 DIAGNOSIS — L8990 Pressure ulcer of unspecified site, unspecified stage: Secondary | ICD-10-CM | POA: Insufficient documentation

## 2021-02-12 NOTE — Assessment & Plan Note (Signed)
CBGs 50-62 in am and 180 - 200 at hs. [name] reduced Glimepiride to 3 mg at HS and 4 mg q am Can use 2 mg qhs and 4 mg q am

## 2021-02-12 NOTE — Assessment & Plan Note (Signed)
Cont w/Zinc oxide

## 2021-02-12 NOTE — Assessment & Plan Note (Signed)
Low dose Oxycodone prn pain

## 2021-02-12 NOTE — Progress Notes (Signed)
Virtual Visit via Video Note  I connected with Marleta Hennis Comley on 02/12/21 at  1:40 PM EDT by a video enabled telemedicine application and verified that I am speaking with the correct person using two identifiers.   I discussed the limitations of evaluation and management by telemedicine and the availability of in person appointments. The patient expressed understanding and agreed to proceed.  I was located at our [facility] office. The patient was at home. [name] is present in the visit.   History of Present Illness: We need to follow-up on DM, pressure sores, DM, LBP. CBGs 50-62 in am and 180 - 200 at hs. [name] reduced Glimepiride to 3 mg at HS and 4 mg q am Pain - using Oxy 5 mg 1/2 tab prn Constipation - using Senakot Zinx oxide oint for pressure sores    Observations/Objective: The patient appears to be in no acute distress  Assessment and Plan:  See my Assessment and Plan. Follow Up Instructions:    I discussed the assessment and treatment plan with the patient. The patient was provided an opportunity to ask questions and all were answered. The patient agreed with the plan and demonstrated an understanding of the instructions.   The patient was advised to call back or seek an in-person evaluation if the symptoms worsen or if the condition fails to improve as anticipated.  I provided face-to-face time during this encounter. We were at different locations.   Walker Kehr, MD

## 2021-02-12 NOTE — Assessment & Plan Note (Signed)
Chronic Cont w/Senakot po

## 2021-02-12 NOTE — Assessment & Plan Note (Signed)
Pt is under Hospice care

## 2021-02-12 NOTE — Assessment & Plan Note (Signed)
Hospice Care at home

## 2021-02-26 ENCOUNTER — Ambulatory Visit (INDEPENDENT_AMBULATORY_CARE_PROVIDER_SITE_OTHER): Payer: Medicare Other | Admitting: Internal Medicine

## 2021-02-26 ENCOUNTER — Encounter: Payer: Self-pay | Admitting: Internal Medicine

## 2021-02-26 DIAGNOSIS — E669 Obesity, unspecified: Secondary | ICD-10-CM

## 2021-02-26 DIAGNOSIS — R453 Demoralization and apathy: Secondary | ICD-10-CM

## 2021-02-26 DIAGNOSIS — R627 Adult failure to thrive: Secondary | ICD-10-CM | POA: Diagnosis not present

## 2021-02-26 DIAGNOSIS — E1169 Type 2 diabetes mellitus with other specified complication: Secondary | ICD-10-CM

## 2021-02-26 DIAGNOSIS — Z794 Long term (current) use of insulin: Secondary | ICD-10-CM

## 2021-02-26 DIAGNOSIS — I25810 Atherosclerosis of coronary artery bypass graft(s) without angina pectoris: Secondary | ICD-10-CM | POA: Diagnosis not present

## 2021-02-26 MED ORDER — GLIMEPIRIDE 4 MG PO TABS: 2.0000 mg | ORAL_TABLET | Freq: Two times a day (BID) | ORAL | 5 refills | Status: AC

## 2021-02-26 NOTE — Assessment & Plan Note (Signed)
There is no reports of angina.  The patient is under hospice care

## 2021-02-26 NOTE — Progress Notes (Signed)
Virtual Visit via Video Note  I connected with Sandy Owens on 02/26/21 at  4:00 PM EDT by a video enabled telemedicine application and verified that I am speaking with the correct person using two identifiers.   I discussed the limitations of evaluation and management by telemedicine and the availability of in person appointments. The patient expressed understanding and agreed to proceed.  I was located at our [facility] office. The patient was at home. [name] is present in the visit.   History of Present Illness: We need to follow-up on diabetes.  The patient was receiving 4 mg of glimepiride in the morning and 2 mg at night.  Her CBGs have been on the low [phone].  The patient remains quiet with her eyes closed throughout the day.  She wakes up when alerted.  She appears comfortable to the family.  She has been taking only Tylenol for pain. F/u on FTT, apathy   Observations/Objective: The patient appears to be in no acute distress  Assessment and Plan:  See my Assessment and Plan. Follow Up Instructions:    I discussed the assessment and treatment plan with the patient. The patient was provided an opportunity to ask questions and all were answered. The patient agreed with the plan and demonstrated an understanding of the instructions.   The patient was advised to call back or seek an in-person evaluation if the symptoms worsen or if the condition fails to improve as anticipated.  I provided face-to-face time during this encounter. We were at different locations.   Walker Kehr, MD

## 2021-02-26 NOTE — Assessment & Plan Note (Signed)
Discussed with daughter.  Sandy Owens seems to be comfortable at all times.  Continue with supportive measures

## 2021-02-26 NOTE — Assessment & Plan Note (Signed)
Hospice care to continue.  The patient seems to be comfortable.  Tylenol as needed pain

## 2021-02-26 NOTE — Assessment & Plan Note (Signed)
We will reduce glimepiride to 2 mg twice a day due to CBGs being on the low side- see history.  Of insulin. Virtual follow-up in 4 weeks

## 2021-02-26 NOTE — Assessment & Plan Note (Addendum)
We will reduce glimepiride to 2 mg twice a day due to CBGs being on the low side- see history.  Of insulin. Virtual follow-up in 4 weeks

## 2021-02-27 NOTE — Telephone Encounter (Signed)
Was change on yesterday.Marland KitchenJohny Owens

## 2021-03-27 ENCOUNTER — Telehealth: Payer: Self-pay | Admitting: *Deleted

## 2021-03-27 NOTE — Telephone Encounter (Signed)
-----   Message from [place] sent at 03/21/2021  3:02 PM EDT ----- [place] orders

## 2021-03-27 NOTE — Telephone Encounter (Signed)
-----   Message from [place] sent at 03/21/2021  3:02 PM EDT ----- [place] orders

## 2021-03-27 NOTE — Telephone Encounter (Signed)
Order has been faxed back../lmb 

## 2021-03-29 ENCOUNTER — Telehealth: Payer: Self-pay | Admitting: Internal Medicine

## 2021-03-29 NOTE — Chronic Care Management (AMB) (Signed)
  Chronic Care Management   Outreach Note  03/29/2021 Name: Makena Murdock [facility] MRN: [id number] DOB: 04-01-1924  Referred by: [name], MD Reason for referral : No chief complaint on file.   An unsuccessful telephone outreach was attempted today. The patient was referred to the pharmacist for assistance with care management and care coordination.   Follow Up Plan:   Lauretta Grill [employer] Scheduler

## 2021-03-30 ENCOUNTER — Telehealth: Payer: Self-pay | Admitting: *Deleted

## 2021-03-30 NOTE — Telephone Encounter (Signed)
Faxed order back to [facility]../lmb 

## 2021-03-30 NOTE — Telephone Encounter (Signed)
-----   Message from [place] sent at 03/28/2021  3:35 PM EDT ----- [place] unsigned orders

## 2021-04-02 ENCOUNTER — Ambulatory Visit: Admitting: Internal Medicine

## 2021-06-07 ENCOUNTER — Ambulatory Visit: Payer: Medicare HMO | Admitting: Cardiology

## 2021-06-07 ENCOUNTER — Other Ambulatory Visit: Payer: Medicare HMO

## 2021-06-14 ENCOUNTER — Telehealth: Payer: Self-pay | Admitting: Internal Medicine

## 2021-06-14 NOTE — Telephone Encounter (Signed)
[facility] FYI...  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 Alain Marion. Alda Berthold can be reached at [phone].  Advised to see hospice nurse now. Time of Death 12-04-1500. Call was sent to on-call provider.

## 2021-06-15 ENCOUNTER — Telehealth: Payer: Self-pay | Admitting: Internal Medicine

## 2021-06-15 NOTE — Telephone Encounter (Signed)
Dawn from [facility] calling to make aware of death certificate for patient  Please call [phone]

## 2021-06-15 NOTE — Telephone Encounter (Signed)
Yes date 06/14/21 @ 15:02 pm

## 2021-06-15 NOTE — Telephone Encounter (Signed)
Noted.  I am sorry it.  Is the date of death 24-Jun-2021?  Thank you

## 2021-06-18 NOTE — Telephone Encounter (Signed)
[employer] date of death is 07/01/2021

## 2021-06-18 NOTE — Telephone Encounter (Signed)
Done. Card sent

## 2021-07-03 DEATH — deceased

## 2021-12-31 ENCOUNTER — Ambulatory Visit: Payer: Medicare HMO
